# Patient Record
Sex: Male | Born: 1935 | Race: White | Hispanic: No | Marital: Married | Smoking: Former smoker
Health system: Southern US, Community
[De-identification: ages and names within clinical notes are randomized; demographics above are authoritative.]

## PROBLEM LIST (undated history)

## (undated) DIAGNOSIS — E039 Hypothyroidism, unspecified: Secondary | ICD-10-CM

## (undated) DIAGNOSIS — E782 Mixed hyperlipidemia: Secondary | ICD-10-CM

## (undated) DIAGNOSIS — F0390 Unspecified dementia without behavioral disturbance: Secondary | ICD-10-CM

## (undated) DIAGNOSIS — I219 Acute myocardial infarction, unspecified: Secondary | ICD-10-CM

## (undated) DIAGNOSIS — I2510 Atherosclerotic heart disease of native coronary artery without angina pectoris: Secondary | ICD-10-CM

## (undated) DIAGNOSIS — I10 Essential (primary) hypertension: Secondary | ICD-10-CM

## (undated) HISTORY — DX: Atherosclerotic heart disease of native coronary artery without angina pectoris: I25.10

## (undated) HISTORY — DX: Essential (primary) hypertension: I10

## (undated) HISTORY — DX: Mixed hyperlipidemia: E78.2

## (undated) HISTORY — DX: Unspecified dementia, unspecified severity, without behavioral disturbance, psychotic disturbance, mood disturbance, and anxiety: F03.90

## (undated) HISTORY — PX: CHOLECYSTECTOMY: SHX55

## (undated) HISTORY — PX: TONSILLECTOMY: SUR1361

## (undated) HISTORY — PX: CORONARY ANGIOPLASTY: SHX604

## (undated) HISTORY — DX: Acute myocardial infarction, unspecified: I21.9

---

## 2003-11-17 ENCOUNTER — Inpatient Hospital Stay (HOSPITAL_COMMUNITY): Admission: EM | Admit: 2003-11-17 | Discharge: 2003-11-20 | Payer: Self-pay | Admitting: Cardiology

## 2003-11-18 ENCOUNTER — Encounter: Payer: Self-pay | Admitting: Cardiovascular Disease

## 2003-12-23 ENCOUNTER — Ambulatory Visit: Payer: Self-pay | Admitting: Cardiology

## 2005-02-01 ENCOUNTER — Ambulatory Visit: Payer: Self-pay | Admitting: Cardiology

## 2006-02-28 ENCOUNTER — Encounter: Payer: Self-pay | Admitting: Cardiology

## 2006-03-04 ENCOUNTER — Ambulatory Visit: Payer: Self-pay | Admitting: Cardiology

## 2006-03-06 ENCOUNTER — Encounter: Payer: Self-pay | Admitting: Cardiology

## 2007-03-19 ENCOUNTER — Ambulatory Visit: Payer: Self-pay | Admitting: Cardiology

## 2007-03-19 ENCOUNTER — Encounter: Payer: Self-pay | Admitting: Cardiology

## 2007-04-01 ENCOUNTER — Ambulatory Visit: Payer: Self-pay | Admitting: Cardiology

## 2008-04-19 ENCOUNTER — Encounter: Payer: Self-pay | Admitting: Cardiology

## 2008-05-30 ENCOUNTER — Encounter: Payer: Self-pay | Admitting: Physician Assistant

## 2008-07-28 ENCOUNTER — Ambulatory Visit: Payer: Self-pay | Admitting: Cardiology

## 2008-10-11 DIAGNOSIS — I10 Essential (primary) hypertension: Secondary | ICD-10-CM | POA: Insufficient documentation

## 2008-10-11 DIAGNOSIS — I2510 Atherosclerotic heart disease of native coronary artery without angina pectoris: Secondary | ICD-10-CM

## 2008-10-11 DIAGNOSIS — E782 Mixed hyperlipidemia: Secondary | ICD-10-CM | POA: Insufficient documentation

## 2009-04-20 ENCOUNTER — Encounter: Payer: Self-pay | Admitting: Cardiology

## 2009-08-11 ENCOUNTER — Ambulatory Visit: Payer: Self-pay | Admitting: Cardiology

## 2009-08-11 DIAGNOSIS — I6529 Occlusion and stenosis of unspecified carotid artery: Secondary | ICD-10-CM | POA: Insufficient documentation

## 2010-02-20 NOTE — Assessment & Plan Note (Signed)
Summary: 1 YR FU PER JULY REMINDER-SRS      Allergies Added: NKDA  Visit Type:  Follow-up Primary Provider:  Dr. Kirstie Peri   History of Present Illness: 75 year old male presents for followup. He was last seen in July 2010. Since his last visit he reports no significant angina or unusual breathlessness. He continues to exercise 3 days a week, also playing golf regularly.  Labs from March revealed BUN 16, creatinine 1.3, AST 32, ALT 27, cholesterol 154, HDL 38, LDL 89, triglycerides 135, sodium 140, potassium 4.5, TSH 0.53, hemoglobin 14.4. We reviewed his lipids. He has tolerated simvastatin long-term.  No claudication symptoms. No speech problems or memory deficits. No focal motor weakness.  I reviewed his prior ischemic testing from 2009. Last carotid duplex was in 2008. We discussed repeat screening testing, however he prefers to hold off until his next visit.  Preventive Screening-Counseling & Management  Alcohol-Tobacco     Smoking Status: quit     Year Quit: 1989  Current Medications (verified): 1)  Atenolol 50 Mg Tabs (Atenolol) .... Take One Tablet By Mouth Daily 2)  Aspir-Low 81 Mg Tbec (Aspirin) .... Take 1 Tablet By Mouth Once A Day 3)  Synthroid 125 Mcg Tabs (Levothyroxine Sodium) .... Take 1 Tablet By Mouth Once A Day 4)  Simvastatin 40 Mg Tabs (Simvastatin) .... Take 1 Tablet By Mouth Once A Day 5)  Ramipril 10 Mg Caps (Ramipril) .... Take 1 Tablet By Mouth Once A Day 6)  Nitrostat 0.4 Mg Subl (Nitroglycerin) .... Use As Directed For Chest Pain Up To 3 Doses Every 5 Minutes.  Allergies (verified): No Known Drug Allergies  Comments:  Nurse/Medical Assistant: The patient's medication list and allergies were reviewed with the patient and were updated in the Medication and Allergy Lists.  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[employer]) Married  Tobacco Use - Former Alcohol Use - yes  Past Medical  History: CAD - DES LAD and DES x 3 RCA 2005, LVEF 60% Hyperlipidemia Hypertension Myocardial Infarction - 1989 (streptokinase) and 2005 Carotid artery disease - nonobstructive 2008  Past Surgical History: Unremarkable  Family History: Family History of Coronary Artery Disease  Social History: Retired Film/video editor Microbiologist) Married  Tobacco Use - Former Alcohol Use - yes  Review of Systems  The patient denies anorexia, fever, chest pain, syncope, dyspnea on exertion, peripheral edema, headaches, abdominal pain, melena, hematochezia, and severe indigestion/heartburn.         Otherwise reviewed and negative except as outlined.  Vital Signs:  Patient profile:   75 year old male Height:      71 inches Weight:      229 pounds BMI:     32.05 Pulse rate:   54 / minute BP sitting:   131 / 76  (left arm) Cuff size:   large  Vitals Entered By: Carlye Grippe (August 11, 2009 10:41 AM)  Nutrition Counseling: Patient's BMI is greater than 25 and therefore counseled on weight management options.  Physical Exam  Additional Exam:  Overweight male in no acute distress. HEENT: Conjunctiva and lids normal, oropharynx clear. Neck: Supple, no elevated JVP or bruits. No thyromegaly. Lungs: Clear to auscultation, nonlabored. Cardiac: Regular rate and rhythm, no S3 or pericardial rub. Abdomen: Soft, nontender, bowel sounds present. Skin: Warm and dry. Extremities: No pitting edema, distal pulses 2+. Musculoskeletal: No deformities. Neuropsychiatric: Alert and oriented x3, affect appropriate.   Stress Echocardiogram  Procedure date:  04/01/2007  Findings:      Exercise echocardiogram:  Achieved stage 4, 12 METS, 1 mm ST segment depression in the inferolateral leads, no chest pain. No inducible wall motion abnomalities to suggest ischemia by echocardiographic imaging, normal LVEF.  Echocardiogram  Procedure date:  04/01/2007  Findings:      LVEF 55-60%, no regional  wall motion abnormalities. Mild aortic stenosis.  EKG  Procedure date:  08/11/2009  Findings:      Sinus rhythm at 55 beats per minute with prolonged PR interval, evidence of previous inferior infarct.  Impression & Recommendations:  Problem # 1:  CAD, NATIVE VESSEL (ICD-414.01)  Symptomatically stable on medical therapy. No active ischemia by followup exercise echocardiogram in March 2009. We will likely proceed with followup screening ischemic evaluation around time of his next visit. No medication changes made today. Continue annual followup, sooner if needed.  His updated medication list for this problem includes:    Atenolol 50 Mg Tabs (Atenolol) .Marland Kitchen... Take one tablet by mouth daily    Aspir-low 81 Mg Tbec (Aspirin) .Marland Kitchen... Take 1 tablet by mouth once a day    Ramipril 10 Mg Caps (Ramipril) .Marland Kitchen... Take 1 tablet by mouth once a day    Nitrostat 0.4 Mg Subl (Nitroglycerin) ..... Use as directed for chest pain up to 3 doses every 5 minutes.  Orders: EKG w/ Interpretation (93000)  Problem # 2:  HYPERTENSION, UNSPECIFIED (ICD-401.9)  Blood pressure reasonably well controlled today.  His updated medication list for this problem includes:    Atenolol 50 Mg Tabs (Atenolol) .Marland Kitchen... Take one tablet by mouth daily    Aspir-low 81 Mg Tbec (Aspirin) .Marland Kitchen... Take 1 tablet by mouth once a day    Ramipril 10 Mg Caps (Ramipril) .Marland Kitchen... Take 1 tablet by mouth once a day  Problem # 3:  HYPERLIPIDEMIA-MIXED (ICD-272.4)  LDL 89 at most recent check, normal liver function tests. Tolerating simvastatin.  His updated medication list for this problem includes:    Simvastatin 40 Mg Tabs (Simvastatin) .Marland Kitchen... Take 1 tablet by mouth once a day  Problem # 4:  CAROTID ARTERY DISEASE (ICD-433.10)  Nonobstructive based on prior assessment. We discussed followup carotid duplex, and he prefers deferring until around time of his next visit. Continue aspirin.  His updated medication list for this problem  includes:    Aspir-low 81 Mg Tbec (Aspirin) .Marland Kitchen... Take 1 tablet by mouth once a day  Patient Instructions: 1)  Your physician wants you to follow-up in: 1 year. You will receive a reminder letter in the mail one-two months in advance. If you don't receive a letter, please call our office to schedule the follow-up appointment. 2)  Your physician recommends that you continue on your current medications as directed. Please refer to the Current Medication list given to you today. Prescriptions: NITROSTAT 0.4 MG SUBL (NITROGLYCERIN) use as directed for chest pain up to 3 doses every 5 minutes.  #25 x 3   Entered by:   Cyril Loosen, RN, BSN   Authorized by:   Loreli Slot, MD, [facility]   Signed by:   Cyril Loosen, RN, BSN on 08/11/2009   Method used:   Electronically to        [facility]* (retail)       [address]       [place], [place]  [address]       Ph: [phone]       Fax:  [phone]   RxID:   [id number]

## 2010-06-05 NOTE — Assessment & Plan Note (Signed)
[facility]                          [facility] OFFICE NOTE   Cole Mayer, Cole Mayer                      MRN:          [id number]  DATE:03/19/2007                            DOB:          Aug 19, 1935    CARDIOLOGIST:  Dr. Simona Huh.   PRIMARY CARE PHYSICIAN:  Dr. Kirstie Peri.   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  He presents to the office today for  follow-up.  He has a history of coronary artery disease and is status  post acute inferior ST elevation myocardial infarction in October 2005.  He was treated with emergent TAXUS drug-eluting stent placement to the  RCA x3 and TAXUS stent to the proximal LAD.  He did have residual  noncritical circumflex disease and ejection fraction has been preserved  in the past.   In the office, the patient is doing well.  He exercises on a daily basis  and also plays golf.  He denies any exertional chest heaviness or  tightness.  Denies any orthopnea, PND, pedal edema.  Denies any  palpitations, syncope or near-syncope.   CURRENT MEDICATIONS:  Synthroid 125 mcg daily, aspirin 325 mg daily,  niacin 750 mg daily, Altace 2.5 mg daily, atenolol 50 mg daily,  simvastatin 40 mg q.h.s.  Nitroglycerin p.r.n. chest pain.   ALLERGIES:  No known drug allergies.   PHYSICAL EXAM:  He is well-nourished, well-developed male in no acute  distress.  Blood pressure 140/76, pulse 60, weight 237.4 pounds.  HEENT:  normal.  NECK:  Without JVD.  CARDIAC:  S1, S2.  Regular rate and rhythm.  There is a 1-2/6 systolic  ejection murmur heard best along the left sternal border.  LUNGS:  Clear to auscultation bilaterally without wheezing, rhonchi or  rales.  ABDOMEN:  Soft, nontender, normoactive bowel sounds, no organomegaly.  EXTREMITIES:  Without edema.  NEUROLOGIC:  He is alert and  oriented x3.  Cranial nerves 2-12 are  intact.  VASCULAR:  Bilateral carotid artery bruits.   ELECTROCARDIOGRAM:  Reveals sinus rhythm with heart rate of 62, normal  axis, nonspecific ST-T wave changes.   IMPRESSION:  1. Systolic murmur.  2. Coronary artery disease.      a.     Status post acute inferior STEMI in October 2005 treated       with TAXUS drug-eluting stent to the RCA x3 and Taxus drug-eluting       stent to LAD.      b.     Residual CAD:  50% ostial circumflex at the time of       catheterization October 2005.      c.     History of remote myocardial infarction treated with       streptokinase in the past.  3. Good LV function.  4. Cerbrovascular disease.      a.     Carotid Dopplers February 2008 revealing less than 50%       bilateral ICA stenosis.  5. Dyslipidemia, followed by Dr. Sherryll Burger.  6. Hypertension.   DISCUSSION:  Cole Mayer returns office day for annual follow-up.  Overall he is doing well.  Denies any symptoms consistent with angina.  He does have a systolic murmur on exam.  He is greater than 3 years out  from his MI and PCI.   PLAN:  1. 2-D echocardiogram to further assess his systolic murmur.  2. Proceed with stress echocardiogram to assess stent patency status      post myocardial infarction and PCI.  3. His blood pressure is borderline elevated today.  However, the      patient tells me he keeps a close eye on this and his numbers are      consistently less than 140/90 at home.  4. Continue follow-up with Dr. Sherryll Burger for his hyperlipidemia.  His goal      LDL is less than or equal to 70.  5. He will follow up with Dr. Simona Huh in one year's time or      sooner p.r.n.      Tereso Newcomer, PA-C  Electronically Signed      Learta Codding, MD,FACC  Electronically Signed   SW/MedQ  DD: 03/19/2007  DT: 03/20/2007  Job #: [id number]   cc:   Kirstie Peri, MD

## 2010-06-05 NOTE — Assessment & Plan Note (Signed)
[facility]                          [facility]ICE NOTE   Cole Mayer, Cole Mayer                      MRN:          [id number]  DATE:07/28/2008                            DOB:          May 06, 1935    PRIMARY CARE PHYSICIAN:  Kirstie Peri, MD   REASON FOR VISIT:  Routine followup.   HISTORY OF PRESENT ILLNESS:  Cole Mayer was in the office back in  February 2009 for a regular visit.  He continues to do very well without  any active anginal symptoms.  He exercises most mornings at one of the  local fitness clubs using the treadmill and also weights without any  specific symptoms.  He also plays golf 4 days a week.  His  electrocardiogram shows sinus rhythm with a single premature ventricular  complex and small inferolateral Q-waves which are old.  He underwent  ischemic evaluation after the last visit in clinic and had a normal  exercise echocardiogram without active ischemic changes by  echocardiographic findings in the setting of an abnormal  electrocardiographic response and a maximum workload of 12 minutes.  No  chest pain was reported at that time.  He also had a surface  echocardiogram with findings of mild aortic stenosis and a left  ventricular ejection fraction of 55-60%.  He reports tolerating his  medicines fairly well.  He had labs done per Dr. Sherryll Burger back in March  showing a BUN and creatinine of 80 and 1.3 respectively.  Total  cholesterol 146, triglycerides 129, HDL 40, LDL 80.  SGOT 29, SGPT 28,  hemoglobin 14.8.   ALLERGIES:  No known drug allergies.   PRESENT MEDICATIONS:  1. Enteric-coated aspirin 81 mg p.o. daily.  2. Synthroid 125 mcg p.o. daily.  3. Atenolol 50 mg p.o. daily.  4. Simvastatin 40 mg p.o. daily.  5. Ramipril 10 mg p.o. daily.  6. Sublingual nitroglycerin 4 mg p.r.n.   REVIEW OF SYSTEMS:  Outlined above.  He is not having any claudication  symptoms.  He has had no palpitations or syncope.  He reports  stable  appetite and an intentional weight loss.  Otherwise, systems reviewed  and negative.   PHYSICAL EXAMINATION:  VITAL SIGNS:  Blood pressure 130/81, heart rate  is 61, weight is 222 pounds which is down from 237 pounds at his last  visit.  GENERAL:  He is comfortable and in no acute distress.  HEENT:  Conjunctiva is normal.  Oropharynx clear.  NECK:  Supple.  No elevated jugular venous pressure.  Faint right  carotid bruit.  No thyromegaly.  LUNGS:  Clear without labored breathing at rest.  CARDIAC:  Regular rate and rhythm.  Soft basal systolic murmur with  preserved second heart sounds.  No S3 gallop or pericardial rub.  ABDOMEN:  Soft, nontender.  No bruits.  No hepatomegaly.  EXTREMITIES:  Exhibit no frank pitting edema.  Distal pulses are 2+.  SKIN:  Warm and dry.  MUSCULOSKELETAL:  No kyphosis is noted.  NEUROPSYCHIATRIC:  The patient is alert and oriented x3.  Affect is  appropriate.   Review of  previous carotid Doppler examination from March 2009 revealed  less than 50% stenosis in the internal carotid arteries.   IMPRESSION AND RECOMMENDATIONS:  1. Cardiovascular disease, status post previous inferior wall      myocardial infarction in October 2005 treated with drug-eluting      stents in the right coronary artery as well as ultimately a drug-      eluting stent in the left anterior descending.  He has residual 50%      circumflex stenosis that has been managed medically and had an      overall reassuring exercise echocardiogram done last year.  He is      not reporting any new symptoms and has been exercising regularly      with an intentional weight loss and diet.  His medications are      stable, and I will not make any specific changes today.  We talked      about warning signs and will otherwise plan an annual followup.  No      further ischemic testing is needed at this time.  2. Hyperlipidemia, fairly well-controlled on statin therapy.  Recent      LDL was  80 with normal liver function tests on simvastatin.  3. Nonobstructive carotid artery disease, asymptomatic.  4. Hypertension, reasonably well-controlled.     Jonelle Sidle, MD  Electronically Signed    SGM/MedQ  DD: 07/28/2008  DT: 07/29/2008  Job #: [id number]   cc:   Kirstie Peri, MD

## 2010-06-08 NOTE — Assessment & Plan Note (Signed)
[facility]                          [facility]ICE NOTE   Cole Mayer, Cole Mayer                        MRN:          [id number]  DATE:03/04/2006                            DOB:          10/22/1935    Primary cardiologist:  Dr. Simona Huh.   REASON FOR VISIT:  Scheduled annual followup.   Cole Mayer is a very pleasant 75 year old male with known coronary  artery disease, who now presents for annual followup.   Patient has been doing extremely well from a clinical standpoint, with  no interim development of symptoms suggestive of unstable angina  pectoris.  In fact, he exercises on a regular basis both at a workout  facility as well as playing golf at least 4 times a week.  Moreover, he  has also lost 30 pounds in this past year.   Quite recently, patient was briefly hospitalized here at [facility] with  flu-like symptoms, and was found to be positive for influenza type A.  While awaiting blood work, he experienced a syncopal episode which was  felt to be secondary to orthostatic hypotension.  He was treated with  fluids and taken off his Altace and Atenolol, pending this followup.  He  was just discharged 3 days ago, and has not had any recurrent symptoms.  His blood pressure, however, has drifted upward since he stopped both  these medications.   CURRENT MEDICATIONS:  1. Synthroid 0.125 daily.  2. Aspirin 81 daily.  3. Niacin 750 daily.  4. Simvastatin 40 daily.  5. Theraflu.   PHYSICAL EXAMINATION:  Blood pressure 170/101.  Pulse 82, regular.  Weight 229, up 0.6.  GENERAL:  A 75 year old male sitting upright, in no distress.  NECK:  Palpable carotid pulses with faint right carotid bruit; no bruit  on the left.  LUNGS:  Clear to auscultation in all fields.  HEART:  Regular rate and rhythm (S1 and S2).  Soft S4 with a soft grade  2/6 holosystolic murmur in the upper left sternal border.  ABDOMEN:  Soft, non-tender with intact bowel  sounds.  EXTREMITIES:  Palpable distal pulses without significant edema.  NEUROLOGIC:  No focal deficit.   IMPRESSION:  1. Severe 2-vessel coronary artery disease.      a.     Status post acute inferior myocardial infarction/emergent       overlapping TAXUS stenting of the right coronary artery (x3), and       TAXUS stenting of the proximal left anterior descending, October       2005.      b.     Residual non-critical CFX disease.      c.     Preserved left ventricular function.  2. Mild cerebral vascular disease.      a.     40%-50% left internal coronary artery; 0-39% right internal       coronary artery stenosis by carotid Doppler, January 2007.  3. Hyperlipidemia.      a.     Followed by Dr. Eliberto Ivory.  4. Hypertension.  5. Remote tobacco.   PLAN:  1. Resume previous  medication regimen, including Altace and atenolol      to begin later today.  Patient has been instructed to check his      blood pressure in the next 7 to 10 days, and to contact us with any      questions or concerns.  2. Schedule annual surveillance carotid Dopplers to exclude      significant progression.  3. Schedule return clinic followup with Dr. Simona Huh in 1 year.  4. Continue aggressive lipid management, per Dr. Eliberto Ivory, with LDL goal      of 70 or less.      Gene Serpe, PA-C  Electronically Signed      Learta Codding, MD,FACC  Electronically Signed   GS/MedQ  DD: 03/04/2006  DT: 12/04/2006  Job #: [id number]

## 2010-06-08 NOTE — Cardiovascular Report (Signed)
NAME:  ERVEN, RAMSON NO.:  [id number]   MEDICAL RECORD NO.:  [id number]          PATIENT TYPE:  INP   LOCATION:  2931                         FACILITY:  [facility]   PHYSICIAN:  Charlies Constable, M.D. [facility] DATE OF BIRTH:  1935-04-26   DATE OF PROCEDURE:  11/17/2003  DATE OF DISCHARGE:                              CARDIAC CATHETERIZATION   CLINICAL HISTORY:  Mr. Siems is 75 years old and has a remote history of  myocardial infarction treated with Streptokinase for which he did not  require subsequent angioplasty.  He developed the onset of chest pain today  and presented to [facility] where his EKG showed 1 mm ST elevation  in the inferior leads.  He was given aspirin, Lovenox, and Integrillin and  transferred to [place] for intervention.   PROCEDURE:  The procedure was performed by the right femoral artery using  arterial sheath and 6 French coronary catheters.  Omnipaque contrast was  used.  After completion of the diagnostic study, we proceeded with  intervention of the right coronary artery and possibly the left anterior  descending  artery.  The patient had been given Lovenox within three hours,  so we did not give any additional anticoagulation.  We did continue the  Integrillin and we gave 600 mg of Plavix.  We used a JR4 6 [place] guiding  catheter with side holes and a Luge wire.  We crossed the lesions in the  proximal, mid, and distal right coronary artery with the wire without  difficulty.  We first went in with the diver catheter since there was a  thrombus in the proxima portion of the right coronary artery.  We performed  several runs with aspiration and obtained a small amount of thrombus with  some resolution of the visible thrombus.  We then went in with a 2.5 by 20  mm Maverick and performed multiple inflations from the proximal to the  distal vessel.  We then deployed a 3 by 32 mm Taxus stent beginning in the  distal vessel and we deployed this with  one inflation of 15 atmospheres for  30 seconds.  We then deployed a second 3 by 30 mm Taxus stent overlapping  the first stent and deployed this with one inflation of 15 atmospheres for  30 seconds.  We then deployed a 3.5 by 12 mm Taxus overlapping the second  stent in the proximal right coronary artery and deployed this with one  inflation of 15 atmospheres for 30 seconds.  We then post dilated all stents  with a 3.5 by 20 mm Quantum Maverick performing multiple inflations up to 16  atmospheres avoiding the distal and the very proximal edges.  Before we did  the post dilatation, there appeared to be a small filling defect in the mid  stent.  For this reason, we decided to give an additional Lovenox and we  gave 0.3 mg per kg IV.  This defect disappeared following the post  dilations.   We next approached the left anterior descending  artery.  We used a Q4 6  [place] guiding catheter with side  holes and a new Luge wire.  We crossed the  lesion with the wire without difficulty.  We predilated with a 2.5 by 20 mm  Maverick performing one inflation up to 10 atmospheres for 30 seconds.  We  then employed a 3 by 20 mm Taxus stent following this with one inflation of  14 atmospheres for 30 seconds.  We post dilated the mid and distal portion  of the stent with a 3.25 by 50 mm Quantum Maverick performing two inflations  up to 16 atmospheres for 30 seconds.  We post dilated the proximal portion  of the stent with a 3.75 by 8 mm Quantum Maverick performing one inflation  up to 16 atmospheres for 30 seconds.  Final diagnostic studies were then  performed through the guiding catheter.  The patient tolerated the procedure  well and left the laboratory in satisfactory condition.   RESULTS:  The aortic pressure was 138/90 with a mean of 113.   Left main coronary artery is free of significant disease.   Left anterior descending artery gave rise to a diagonal branch and a septal  perforator and a  second diagonal branch.  There was a 95% segmental stenosis  after the diagonal branch and septal perforator.   The circumflex artery gives rise to a ramus branch and an AV branch.  There  was 50% narrowing in the ostium of the AV branch.   The right coronary artery is a moderately large vessel that gave rise to a  right ventricular branch, posterior descending branch, and a posterolateral  branch.  There was 99% stenosis in the proximal vessel with a filling defect  in the proximal vessel distal to the stenosis.  There were 70% and 80%  stenoses in the mid vessel and 90% stenosis in the distal vessel.  TIMI flow  was initially TIMI-II.   The left ventriculogram performed in the RAO projection showed hypokinesis  of the inferobasal segment.  The rest of the wall motion was good and the  estimated ejection fraction was 60%.   Following placement of three tandem overlapping stents in the proximal, mid,  and distal right coronary artery, the stenoses improved from 99%, 80%, and  90%, to less than 10% and the flow improved from TIMI II to TIMI III flow.   Following stenting of the lesion in the proximal LAD, the stenosis improved  from 95% to 0%.   The patient had the onset of chest pain at 0700 and arrived at [facility] at 0800.  He arrived at [facility] at 1141 and the first  balloon inflation was at 1243.  This gave a reperfusion time of 5 hours 43  minutes and a time from [facility] of 4 hours and 43 minutes.   CONCLUSION:  1.  Acute diaphragmatic wall infarction with 99% proximal, 70% and 80% mid,      and 90% distal stenosis in the right coronary artery with TIMI-II flow,      50% narrowing in the ostium of the circumflex artery, and 95% stenosis      in the proximal left anterior descending artery and inferobasal wall      hypokinesis with an estimated ejection fraction of 60%. 2.  Successful placement of three tandem overlapping stents in the  proximal,      mid, and distal right coronary artery using Taxus drug eluding stents      with improvement of narrowing from 99%, 80%, and 90%, to less  than 10%      improving the flow from TIMI-II to TIMI-III flow.  3.  Successful stenting of the lesion in the proximal LAD with a Taxus drug      eluting stent with improvement of narrowing from 95% to 0%.   DISPOSITION:  The patient is admitted for further observation.  The  infarction is small so he may be able to go home on day two.  Long term  Plavix therapy will be extremely important.      BB/MEDQ  D:  11/17/2003  T:  11/17/2003  Job:  [id number]   cc:   Winona Legato, M.D.   Jonelle Sidle, M.D. [facility]   Willa Rough, M.D.

## 2010-06-08 NOTE — Discharge Summary (Signed)
NAME:  Cole Mayer, Cole Mayer NO.:  [id number]   MEDICAL RECORD NO.:  [id number]          PATIENT TYPE:  INP   LOCATION:  3733                         FACILITY:  [facility]   PHYSICIAN:  Charlies Constable, M.D. [facility] DATE OF BIRTH:  12/11/1935   DATE OF ADMISSION:  11/17/2003  DATE OF DISCHARGE:  11/20/2003                                 DISCHARGE SUMMARY   PROCEDURES:  1.  Cardiac catheterization.  2.  Coronary arteriogram.  3.  Left ventriculogram.  4.  PTCA and TAXUS stent x3 to the RCA.  5.  PTCA and stent to the LAD.   HOSPITAL COURSE:  Cole Mayer is a 75 year old male with a history of  coronary artery disease.  He had streptokinase at [facility] in 1989.  He was at  [place] on the day of admission and he had acute coronary syndrome  including diaphoresis and radiation of pain to his left arm.  He came to the  [facility] and had a recurrent episode while he was in the  emergency room after he had been given Lovenox.  This was associated with  bradycardia.  He had inferior ST elevations.  He was transferred urgently to  [facility] for further evaluation and catheterization.   The cardiac catheterization showed a 95% mid LAD that was treated with a  TAXUS stent reducing the stenosis to zero.  The RCA had multiple lesions  between 70 and 99%.  There was thrombus in the RCA as well.  There was TIMI  II flow.  These were treated with 3 TAXUS stents reducing the stenosis in  all of them to less than 10%.  There was TIMI III flow after the procedure.  His circumflex had a 50% proximal stenosis but no other significant disease.  He had inferior hypokinesis and an EF of 60%.   Cole Mayer CK-MB peaked at 1031/132.  The troponin-I peak was 24.  His  enzymes came down significantly after the procedure.  He remained pain-free.   A 2-D echocardiogram was performed.  His EF was not well visualized but  appeared in the normal range.  His heart valves and right  ventricle were  grossly normal.  There was no pericardial effusion.   Cole Mayer had been on Zocor 10 mg and niacin 750 mg prior to admission for  dyslipidemia.  A lipid profile was checked and showed a total cholesterol of  150, triglycerides 134, HDL 42, LDL 81.  His Zocor was increased to 20 mg a  day and the niacin continued at 750 mg.  He will need a follow up with the  profile in 12 weeks.  As part of his evaluation, a hemoglobin A1C was  checked for hyperglycemia.  The hemoglobin A1C was borderline at 6.1.  He is  to avoid processed sugars and follow closely with his primary care  physician.   Cole Mayer was seen by cardiac rehab.  He was ambulating without chest pain  or shortness of breath.  He was educated on MI restrictions, PTCA and stent  restrictions, cardiac risk factors, diet  and exercise guidelines.  He is  referred to outpatient rehab.   By November 20, 2003, Cole Mayer was ambulating without chest pain or  shortness of breath.  His vital signs were stable and he was considered  stable for discharge with outpatient follow up arranged.   DISCHARGE DIAGNOSES:  1.  Acute inferior myocardial infarction.  2.  Hypertension.  3.  Hyperlipidemia.  4.  Hypothyroidism (TSH normal this admission).  5.  Status post epididymitis, cholecystectomy, scar repair, tonsillectomy.   DISCHARGE INSTRUCTIONS:  1.  His activity level is to include no driving for 5 days and no strenuous      activity until cleared by M.D.  2.  He is to stick to a diet that is low in fat and processed sugars.  3.  He is to call the office for problems with the cath site.  4.  He is to follow up with Dr. Raul Del as needed.  5.  He has an appointment with Dr. Diona Browner on November 16th at 12:30 in the      [place] office.   DISCHARGE MEDICATIONS:  1.  Coated aspirin 325 mg daily.  2.  Plavix 75 mg daily.  3.  Zocor 20 mg daily.  4.  Niacin 750 mg daily.  5.  Nitroglycerin 0.4 mg p.r.n.  6.  Synthroid 125  mcg daily.  7.  Altace 2.5 mg daily.  8.  Tenormin 50 mg daily.       RB/MEDQ  D:  11/20/2003  T:  11/20/2003  Job:  [id number]   cc:   [facility]  [place], [address]

## 2010-06-22 ENCOUNTER — Ambulatory Visit (INDEPENDENT_AMBULATORY_CARE_PROVIDER_SITE_OTHER): Payer: Medicare Other | Admitting: Urology

## 2010-06-22 DIAGNOSIS — N509 Disorder of male genital organs, unspecified: Secondary | ICD-10-CM

## 2010-07-26 ENCOUNTER — Encounter: Payer: Self-pay | Admitting: Cardiology

## 2011-01-21 ENCOUNTER — Encounter: Payer: Self-pay | Admitting: *Deleted

## 2011-02-01 ENCOUNTER — Ambulatory Visit (INDEPENDENT_AMBULATORY_CARE_PROVIDER_SITE_OTHER): Payer: Medicare Other | Admitting: Cardiology

## 2011-02-01 ENCOUNTER — Encounter: Payer: Self-pay | Admitting: Cardiology

## 2011-02-01 DIAGNOSIS — E782 Mixed hyperlipidemia: Secondary | ICD-10-CM

## 2011-02-01 DIAGNOSIS — I2510 Atherosclerotic heart disease of native coronary artery without angina pectoris: Secondary | ICD-10-CM

## 2011-02-01 DIAGNOSIS — I10 Essential (primary) hypertension: Secondary | ICD-10-CM

## 2011-02-01 DIAGNOSIS — I6529 Occlusion and stenosis of unspecified carotid artery: Secondary | ICD-10-CM

## 2011-02-01 MED ORDER — NITROGLYCERIN 0.4 MG SL SUBL: 0.4000 mg | SUBLINGUAL_TABLET | SUBLINGUAL | Status: DC | PRN

## 2011-02-01 NOTE — Assessment & Plan Note (Signed)
No changes to present antihypertensive regimen. Continue exercise and diet.

## 2011-02-01 NOTE — Patient Instructions (Signed)
Your physician you to follow up in 1 year. You will receive a reminder letter in the mail one-two months in advance. If you don't receive a letter, please call our office to schedule the follow-up appointment. Your physician recommends that you continue on your current medications as directed. Please refer to the Current Medication list given to you today. 

## 2011-02-01 NOTE — Assessment & Plan Note (Signed)
I recommended that he followup with a carotid Doppler at his physical.

## 2011-02-01 NOTE — Assessment & Plan Note (Signed)
Should have followup fasting lipid profile and liver function tests with his physical per Dr. Sherryll Burger. LDL should be under 100. He continues on statin therapy.

## 2011-02-01 NOTE — Assessment & Plan Note (Signed)
Symptomatically stable on medical therapy. ECG reviewed. He continues to exercise regularly without major limitation. I have discussed follow up ischemic testing with him over time, however he prefers a conservative approach with observation. Plan to continue annual followup, although I can certainly see him back sooner if symptoms intervene. I did recommend that he followup in the interim with Dr. Sherryll Burger. Prescription given for nitroglycerin. Warning signs and symptoms discussed.

## 2011-02-01 NOTE — Progress Notes (Signed)
   Clinical Summary [name] is a 76 y.o.male presenting for followup. He was seen in July 2011. He states that he is doing quite well, no angina or unusual shortness of breath. No nitroglycerin requirement. He continues to exercise regularly both aerobic and weights, also plays golf weekly.  I reviewed his prior ischemic testing from 2009. Last carotid duplex was in 2008. We have discussed followup testing over time, however he continues to prefer a conservative approach with observation, not entirely unreasonable in light of his asymptomatic status. ECG is stable.  He states that he is due for a full physical with Dr. Sherryll Mayer in April at which time lab work will be obtained. I discussed blood pressure and lipid goals with him. I also recommended that he get a followup carotid Doppler at that time.   No Known Allergies  Current Outpatient Prescriptions  Medication Sig Dispense Refill  . aspirin 81 MG EC tablet Take 81 mg by mouth daily.        Marland Kitchen atenolol (TENORMIN) 50 MG tablet Take 50 mg by mouth daily.        Marland Kitchen levothyroxine (SYNTHROID, LEVOTHROID) 125 MCG tablet Take 125 mcg by mouth daily.        . nitroGLYCERIN (NITROSTAT) 0.4 MG SL tablet Place 0.4 mg under the tongue every 5 (five) minutes as needed. Up to 3 doses.       . ramipril (ALTACE) 10 MG capsule Take 10 mg by mouth daily.        . simvastatin (ZOCOR) 40 MG tablet Take 40 mg by mouth daily.          Past Medical History  Diagnosis Date  . Mixed hyperlipidemia   . Essential hypertension, benign   . Myocardial infarction     1989 (Streptokinase) and 2005  . Coronary atherosclerosis of native coronary artery     DES LAD and DES x3 RCA 2005, LVEF 60%     Social History Cole Mayer reports that he quit smoking about 23 years ago. His smoking use included Cigarettes. He has a 24 pack-year smoking history. He has quit using smokeless tobacco. His smokeless tobacco use included Chew. [name] reports that he drinks  alcohol.  Review of Systems No palpitations, orthopnea, PND. No claudication. No significant lower extremity edema. Stable appetite. Gained a few pounds over the holidays. Otherwise negative.  Physical Examination Filed Vitals:   02/01/11 0817  BP: 137/83  Pulse: 60   Overweight male in no acute distress.  HEENT: Conjunctiva and lids normal, oropharynx clear.  Neck: Supple, no elevated JVP or bruits. No thyromegaly.  Lungs: Clear to auscultation, nonlabored.  Cardiac: Regular rate and rhythm, no S3 or pericardial rub.  Abdomen: Soft, nontender, bowel sounds present.  Skin: Warm and dry.  Extremities: No pitting edema, distal pulses 2+.  Musculoskeletal: No deformities.  Neuropsychiatric: Alert and oriented x3, affect appropriate.    ECG Reviewed in EMR.  Problem List and Plan

## 2012-02-20 ENCOUNTER — Ambulatory Visit (INDEPENDENT_AMBULATORY_CARE_PROVIDER_SITE_OTHER): Payer: Medicare Other | Admitting: Cardiology

## 2012-02-20 ENCOUNTER — Encounter: Payer: Self-pay | Admitting: Cardiology

## 2012-02-20 VITALS — BP 119/71 | HR 58 | Ht 71.0 in | Wt 217.0 lb

## 2012-02-20 DIAGNOSIS — E785 Hyperlipidemia, unspecified: Secondary | ICD-10-CM

## 2012-02-20 DIAGNOSIS — I2510 Atherosclerotic heart disease of native coronary artery without angina pectoris: Secondary | ICD-10-CM

## 2012-02-20 DIAGNOSIS — I10 Essential (primary) hypertension: Secondary | ICD-10-CM

## 2012-02-20 NOTE — Patient Instructions (Addendum)

## 2012-02-20 NOTE — Assessment & Plan Note (Signed)
Continue statin therapy. Followup lab work with Dr. Sherryll Burger. Would aim for LDL around 70 if possible.

## 2012-02-20 NOTE — Assessment & Plan Note (Signed)
Symptomatically stable on medical therapy. ECG reviewed. Continue regular exercise plan. He voiced comfort with continued observation for now. I encouraged him to let me know if he notices any significant functional changes or new symptomatology that may warrant further cardiac evaluation. Otherwise continue annual followup.

## 2012-02-20 NOTE — Assessment & Plan Note (Signed)
Blood pressure very well controlled today. 

## 2012-02-20 NOTE — Progress Notes (Signed)
   Clinical Summary Cole Mayer is a 77 y.o.male presenting for followup. He was seen in January 2013. He states that he has been doing very well, still exercises at the gym regularly and plays golf 4 days a week. He describes no change in his functional capacity, no exertional chest pain or unusual breathlessness.  ECG today shows sinus bradycardia, no significant ST segment changes.  He reports compliance with his medications, no nitroglycerin use. To have his full physical with Dr. Sherryll Burger in April and go over lab work at that time.  We discussed option of observation versus a followup stress test given the length of time since his previous intervention. He voiced comfort with observation for now.   No Known Allergies  Current Outpatient Prescriptions  Medication Sig Dispense Refill  . aspirin 81 MG EC tablet Take 81 mg by mouth daily.        Marland Kitchen atenolol (TENORMIN) 50 MG tablet Take 50 mg by mouth daily.        Marland Kitchen levothyroxine (SYNTHROID, LEVOTHROID) 125 MCG tablet Take 125 mcg by mouth daily.        . nitroGLYCERIN (NITROSTAT) 0.4 MG SL tablet Place 1 tablet (0.4 mg total) under the tongue every 5 (five) minutes as needed. Up to 3 doses.  25 tablet  3  . ramipril (ALTACE) 10 MG capsule Take 10 mg by mouth daily.        . simvastatin (ZOCOR) 40 MG tablet Take 40 mg by mouth daily.          Past Medical History  Diagnosis Date  . Mixed hyperlipidemia   . Essential hypertension, benign   . Myocardial infarction     1989 (Streptokinase) and 2005  . Coronary atherosclerosis of native coronary artery     DES LAD and DES x3 RCA 2005, LVEF 60%     Social History Cole Mayer reports that he quit smoking about 24 years ago. His smoking use included Cigarettes. He has a 24 pack-year smoking history. He has quit using smokeless tobacco. His smokeless tobacco use included Chew. Cole Mayer reports that he drinks alcohol.  Review of Systems No palpitations, no bleeding problems. Stable appetite.  Sleeping well. No claudication. Otherwise negative.  Physical Examination Filed Vitals:   02/20/12 1247  BP: 119/71  Pulse: 58   Filed Weights   02/20/12 1247  Weight: 217 lb (98.431 kg)    Overweight male in no acute distress.  HEENT: Conjunctiva and lids normal, oropharynx clear.  Neck: Supple, no elevated JVP or bruits. No thyromegaly.  Lungs: Clear to auscultation, nonlabored.  Cardiac: Regular rate and rhythm, no S3 or pericardial rub.  Abdomen: Soft, nontender, bowel sounds present.  Skin: Warm and dry.  Extremities: No pitting edema, distal pulses 2+.  Musculoskeletal: No deformities.  Neuropsychiatric: Alert and oriented x3, affect appropriate.    Problem List and Plan   CAD, NATIVE VESSEL Symptomatically stable on medical therapy. ECG reviewed. Continue regular exercise plan. He voiced comfort with continued observation for now. I encouraged him to let me know if he notices any significant functional changes or new symptomatology that may warrant further cardiac evaluation. Otherwise continue annual followup.  Essential hypertension, benign Blood pressure very well controlled today.  HYPERLIPIDEMIA-MIXED Continue statin therapy. Followup lab work with Dr. Sherryll Burger. Would aim for LDL around 70 if possible.    Jonelle Sidle, M.D., F.A.C.C.

## 2012-03-17 ENCOUNTER — Encounter: Payer: Self-pay | Admitting: *Deleted

## 2012-03-17 ENCOUNTER — Other Ambulatory Visit: Payer: Self-pay | Admitting: *Deleted

## 2012-03-17 ENCOUNTER — Telehealth: Payer: Self-pay | Admitting: Cardiology

## 2012-03-17 DIAGNOSIS — I2510 Atherosclerotic heart disease of native coronary artery without angina pectoris: Secondary | ICD-10-CM

## 2012-03-17 NOTE — Telephone Encounter (Signed)
Spoke with patient and he said he wasn't having any problems but knew that it had been a while since he had a stress test and wanted to know if he should go ahead and have it done since it was discussed at last visit.

## 2012-03-17 NOTE — Telephone Encounter (Signed)
Left message for patient to call office.  

## 2012-03-17 NOTE — Telephone Encounter (Signed)
Please schedule an exercise Cardiolite. Hold atenolol the morning of the study.

## 2012-03-17 NOTE — Telephone Encounter (Signed)
Cole Mayer states that Dr. Diona Browner mentioned having a stress test on his last visit. Cole Mayer Is ready now to have the test.

## 2012-03-17 NOTE — Telephone Encounter (Signed)
Exercise cardiolite weight 217 Hold Atenolol the morning of the study.  Diagnosis: 414.01, 401.1   Tuesday, March 4th 2014 @ [facility]

## 2012-03-19 NOTE — Telephone Encounter (Signed)
Auth # [id number] exp 05/03/12

## 2012-03-24 DIAGNOSIS — I2510 Atherosclerotic heart disease of native coronary artery without angina pectoris: Secondary | ICD-10-CM

## 2012-03-31 ENCOUNTER — Telehealth: Payer: Self-pay | Admitting: *Deleted

## 2012-03-31 NOTE — Telephone Encounter (Signed)
Notes Recorded by Lesle Chris, LPN on 0/98/1191 at 11:39 AM Patient notified. ------  Notes Recorded by Lesle Chris, LPN on 4/78/2956 at 5:31 PM Left message to return call.

## 2012-03-31 NOTE — Telephone Encounter (Signed)
Message copied by Lesle Chris on Tue Mar 31, 2012 11:39 AM ------      Message from: MCDOWELL, Illene Bolus      Created: Wed Mar 25, 2012  8:28 AM       Reviewed. Study is overall low risk, consistent with history of CAD and possibility of scar within the inferolateral wall, however no large ischemic zones and overall normal LV function with EF 57%. If he continues to remain symptomatically stable, would recommend observation on medical therapy. ------

## 2012-04-02 ENCOUNTER — Telehealth: Payer: Self-pay | Admitting: Cardiology

## 2012-04-02 ENCOUNTER — Other Ambulatory Visit: Payer: Self-pay | Admitting: *Deleted

## 2012-04-02 MED ORDER — NITROGLYCERIN 0.4 MG SL SUBL: 0.4000 mg | SUBLINGUAL_TABLET | SUBLINGUAL | Status: DC | PRN

## 2012-04-02 NOTE — Telephone Encounter (Signed)
Discussed test results again, patient verbalized understanding.

## 2012-04-02 NOTE — Telephone Encounter (Signed)
Patient came to office and asked to speak with someone regarding stress test results.  Patient not clear on information.

## 2013-02-16 ENCOUNTER — Encounter: Payer: Self-pay | Admitting: Cardiology

## 2013-02-16 ENCOUNTER — Ambulatory Visit (INDEPENDENT_AMBULATORY_CARE_PROVIDER_SITE_OTHER): Payer: Medicare (Managed Care) | Admitting: Cardiology

## 2013-02-16 VITALS — BP 136/75 | HR 52 | Ht 71.0 in | Wt 224.0 lb

## 2013-02-16 DIAGNOSIS — I6529 Occlusion and stenosis of unspecified carotid artery: Secondary | ICD-10-CM

## 2013-02-16 DIAGNOSIS — I10 Essential (primary) hypertension: Secondary | ICD-10-CM

## 2013-02-16 DIAGNOSIS — E785 Hyperlipidemia, unspecified: Secondary | ICD-10-CM

## 2013-02-16 DIAGNOSIS — I2510 Atherosclerotic heart disease of native coronary artery without angina pectoris: Secondary | ICD-10-CM

## 2013-02-16 NOTE — Assessment & Plan Note (Signed)
Continues on statin therapy. Routine lab work with Dr. Manuella Ghazi.

## 2013-02-16 NOTE — Progress Notes (Signed)
    Clinical Summary Cole Mayer is a 78 y.o.male last seen in January 2014. He is doing well, no angina symptoms. He continues to play golf and also exercises at the gym regularly. He reports NYHA class 1-2 dyspnea.  Exercise Cardiolite from February 2014 showed no diagnostic ST segment abnormalities at maximum workload 7 METS, hypertensive response, no chest pain. Perfusion imaging was low risk with evidence of scar in the inferolateral wall, no active ischemia, LVEF 57%.  ECG shows sinus bradycardia with prolonged PR interval.  Medications reviewed.   No Known Allergies  Current Outpatient Prescriptions  Medication Sig Dispense Refill  . aspirin 81 MG EC tablet Take 81 mg by mouth daily.        Marland Kitchen atenolol (TENORMIN) 50 MG tablet Take 50 mg by mouth daily.        Marland Kitchen levothyroxine (SYNTHROID, LEVOTHROID) 125 MCG tablet Take 125 mcg by mouth daily.        . nitroGLYCERIN (NITROSTAT) 0.4 MG SL tablet Place 1 tablet (0.4 mg total) under the tongue every 5 (five) minutes as needed. Up to 3 doses.  25 tablet  3  . ramipril (ALTACE) 10 MG capsule Take 10 mg by mouth daily.        . simvastatin (ZOCOR) 40 MG tablet Take 40 mg by mouth daily.         No current facility-administered medications for this visit.    Past Medical History  Diagnosis Date  . Mixed hyperlipidemia   . Essential hypertension, benign   . Myocardial infarction     1989 (Streptokinase) and 2005  . Coronary atherosclerosis of native coronary artery     DES LAD and DES x3 RCA 2005, LVEF 60%     Social History Mr. Matherne reports that he quit smoking about 25 years ago. His smoking use included Cigarettes. He has a 24 pack-year smoking history. He has quit using smokeless tobacco. His smokeless tobacco use included Chew. Mr. Old reports that he drinks alcohol.  Review of Systems No palpitations, dizziness, syncope. He does bruise easily on aspirin. No major bleeding problems. Otherwise negative.  Physical  Examination Filed Vitals:   02/16/13 1441  BP: 136/75  Pulse: 52   Filed Weights   02/16/13 1441  Weight: 224 lb (101.606 kg)    Appears comfortable at rest. HEENT: Conjunctiva and lids normal, oropharynx clear.  Neck: Supple, no elevated JVP or bruits. No thyromegaly.  Lungs: Clear to auscultation, nonlabored.  Cardiac: Regular rate and rhythm, no S3 or pericardial rub.  Abdomen: Soft, nontender, bowel sounds present.  Skin: Warm and dry.  Extremities: No pitting edema, distal pulses 2+.  Musculoskeletal: No deformities.  Neuropsychiatric: Alert and oriented x3, affect appropriate.    Problem List and Plan   CAD, NATIVE VESSEL Symptomatically stable on medical therapy. ECG reviewed. Followup stress testing from last year was low risk. Continue observation.  CAROTID ARTERY DISEASE Followed by Dr. Manuella Ghazi.  Essential hypertension, benign No change in current regimen. Continue exercise.  HYPERLIPIDEMIA-MIXED Continues on statin therapy. Routine lab work with Dr. Manuella Ghazi.    Satira Sark, M.D., F.A.C.C.

## 2013-02-16 NOTE — Assessment & Plan Note (Signed)
Symptomatically stable on medical therapy. ECG reviewed. Followup stress testing from last year was low risk. Continue observation.

## 2013-02-16 NOTE — Patient Instructions (Signed)

## 2013-02-16 NOTE — Assessment & Plan Note (Signed)
No change in current regimen. Continue exercise.

## 2013-02-16 NOTE — Assessment & Plan Note (Signed)
Followed by Dr. Shah. 

## 2014-02-16 ENCOUNTER — Encounter: Payer: Self-pay | Admitting: *Deleted

## 2014-02-16 ENCOUNTER — Ambulatory Visit (INDEPENDENT_AMBULATORY_CARE_PROVIDER_SITE_OTHER): Payer: 59 | Admitting: Cardiology

## 2014-02-16 ENCOUNTER — Encounter: Payer: Self-pay | Admitting: Cardiology

## 2014-02-16 VITALS — BP 122/70 | HR 60 | Ht 71.0 in | Wt 223.0 lb

## 2014-02-16 DIAGNOSIS — I2510 Atherosclerotic heart disease of native coronary artery without angina pectoris: Secondary | ICD-10-CM

## 2014-02-16 DIAGNOSIS — I10 Essential (primary) hypertension: Secondary | ICD-10-CM

## 2014-02-16 DIAGNOSIS — E782 Mixed hyperlipidemia: Secondary | ICD-10-CM

## 2014-02-16 NOTE — Patient Instructions (Signed)

## 2014-02-16 NOTE — Assessment & Plan Note (Signed)
He continues on Zocor, followed by Dr. Manuella Ghazi. Will request his last lipid panel for review.

## 2014-02-16 NOTE — Assessment & Plan Note (Signed)
No active angina symptoms on medical therapy as outlined. ECG reviewed. He has had ischemic testing within the last 2 years, I recommended that he continue regular diet and exercise plan.

## 2014-02-16 NOTE — Assessment & Plan Note (Signed)
Blood pressure control is good today. No changes were made.

## 2014-02-16 NOTE — Progress Notes (Signed)
Reason for visit: CAD, hyperlipidemia  Clinical Summary Mr. Lama is a 79 y.o.male last seen in January 2015. He comes in for a routine visit. Reports doing well without angina symptoms or nitroglycerin use. He exercises 2-3 days a week at a local gym, also enjoys playing golf. His ECG today shows normal sinus rhythm with prolonged PR interval and incomplete right bundle branch block. He continues on aspirin, beta blocker, ACE inhibitor, and statin.  Exercise Cardiolite from February 2014 showed no diagnostic ST segment abnormalities at maximum workload 7 METS, hypertensive response, no chest pain. Perfusion imaging was low risk with evidence of scar in the inferolateral wall, no active ischemia, LVEF 57%. No clear indication for follow-up testing at this time.  Lipids are followed by Dr. Manuella Ghazi. He reports no side effects with Zocor.   No Known Allergies  Current Outpatient Prescriptions  Medication Sig Dispense Refill  . aspirin 81 MG EC tablet Take 81 mg by mouth daily.      Marland Kitchen atenolol (TENORMIN) 50 MG tablet Take 50 mg by mouth daily.      Marland Kitchen levothyroxine (SYNTHROID, LEVOTHROID) 125 MCG tablet Take 125 mcg by mouth daily.      . nitroGLYCERIN (NITROSTAT) 0.4 MG SL tablet Place 1 tablet (0.4 mg total) under the tongue every 5 (five) minutes as needed. Up to 3 doses. 25 tablet 3  . ramipril (ALTACE) 10 MG capsule Take 10 mg by mouth daily.      . simvastatin (ZOCOR) 40 MG tablet Take 40 mg by mouth daily.       No current facility-administered medications for this visit.    Past Medical History  Diagnosis Date  . Mixed hyperlipidemia   . Essential hypertension, benign   . Myocardial infarction     1989 (Streptokinase) and 2005  . Coronary atherosclerosis of native coronary artery     DES LAD and DES x3 RCA 2005, LVEF 60%     Family History  Problem Relation Age of Onset  . Coronary artery disease Other   . Thyroid disease Father   . Hypertension Father     Social  History Mr. Anacker reports that he quit smoking about 26 years ago. His smoking use included Cigarettes. He has a 24 pack-year smoking history. He has quit using smokeless tobacco. His smokeless tobacco use included Chew. Mr. Sappenfield reports that he drinks alcohol.  Review of Systems Complete review of systems negative except as otherwise outlined in the clinical summary and also the following. No palpitations, dizziness, syncope, claudication. Weight has been stable.  Physical Examination Filed Vitals:   02/16/14 0859  BP: 122/70  Pulse: 60    Wt Readings from Last 3 Encounters:  02/16/14 223 lb (101.152 kg)  02/16/13 224 lb (101.606 kg)  02/20/12 217 lb (98.431 kg)   Appears comfortable at rest. HEENT: Conjunctiva and lids normal, oropharynx clear.  Neck: Supple, no elevated JVP or bruits. No thyromegaly.  Lungs: Clear to auscultation, nonlabored.  Cardiac: Regular rate and rhythm, no S3 or pericardial rub.  Abdomen: Soft, nontender, bowel sounds present.  Skin: Warm and dry.  Extremities: No pitting edema, distal pulses 2+.  Musculoskeletal: No deformities.  Neuropsychiatric: Alert and oriented x3, affect appropriate.    Problem List and Plan   CAD, NATIVE VESSEL No active angina symptoms on medical therapy as outlined. ECG reviewed. He has had ischemic testing within the last 2 years, I recommended that he continue regular diet and exercise plan.   Essential  hypertension, benign Blood pressure control is good today. No changes were made.   Mixed hyperlipidemia He continues on Zocor, followed by Dr. Manuella Ghazi. Will request his last lipid panel for review.     Satira Sark, M.D., F.A.C.C.

## 2014-06-07 ENCOUNTER — Encounter (INDEPENDENT_AMBULATORY_CARE_PROVIDER_SITE_OTHER): Payer: Self-pay | Admitting: *Deleted

## 2014-06-07 ENCOUNTER — Encounter (INDEPENDENT_AMBULATORY_CARE_PROVIDER_SITE_OTHER): Payer: Self-pay

## 2014-10-27 ENCOUNTER — Telehealth (INDEPENDENT_AMBULATORY_CARE_PROVIDER_SITE_OTHER): Payer: Self-pay | Admitting: *Deleted

## 2014-10-27 ENCOUNTER — Other Ambulatory Visit (INDEPENDENT_AMBULATORY_CARE_PROVIDER_SITE_OTHER): Payer: Self-pay | Admitting: *Deleted

## 2014-10-27 DIAGNOSIS — Z86010 Personal history of colonic polyps: Secondary | ICD-10-CM

## 2014-10-27 NOTE — Telephone Encounter (Signed)
Patient need trilyte 

## 2014-10-28 MED ORDER — PEG 3350-KCL-NA BICARB-NACL 420 G PO SOLR: 4000.0000 mL | Freq: Once | ORAL | Status: DC

## 2014-12-21 ENCOUNTER — Telehealth (INDEPENDENT_AMBULATORY_CARE_PROVIDER_SITE_OTHER): Payer: Self-pay | Admitting: *Deleted

## 2014-12-21 NOTE — Telephone Encounter (Signed)
Referring MD/PCP: shah   Procedure: tcs  Reason/Indication:  Hx polyps  Has patient had this procedure before?  Yes, 2011 -- scanned  If so, when, by whom and where?    Is there a family history of colon cancer?  no  Who?  What age when diagnosed?    Is patient diabetic?   no      Does patient have prosthetic heart valve or mechanical valve?  no  Do you have a pacemaker?  no  Has patient ever had endocarditis? no  Has patient had joint replacement within last 12 months?  no  Does patient tend to be constipated or take laxatives? no  Does patient have a history of alcohol/drug use?  no  Is patient on Coumadin, Plavix and/or Aspirin? yes  Medications: see epic  Allergies: nkda  Medication Adjustment: asa 2 days  Procedure date & time: 01/18/15 at 730

## 2014-12-26 NOTE — Telephone Encounter (Signed)
agree

## 2015-01-18 ENCOUNTER — Encounter (HOSPITAL_COMMUNITY): Payer: Self-pay | Admitting: *Deleted

## 2015-01-18 ENCOUNTER — Encounter (HOSPITAL_COMMUNITY)
Admission: RE | Disposition: A | Discharge status: Home / Self Care | Payer: Self-pay | Source: Ambulatory Visit | Attending: Internal Medicine

## 2015-01-18 ENCOUNTER — Ambulatory Visit (HOSPITAL_COMMUNITY)
Admission: RE | Admit: 2015-01-18 | Discharge: 2015-01-18 | Disposition: A | Discharge status: Home / Self Care | Payer: Medicare Other | Source: Ambulatory Visit | Attending: Internal Medicine | Admitting: Internal Medicine

## 2015-01-18 DIAGNOSIS — Z79899 Other long term (current) drug therapy: Secondary | ICD-10-CM | POA: Insufficient documentation

## 2015-01-18 DIAGNOSIS — K644 Residual hemorrhoidal skin tags: Secondary | ICD-10-CM | POA: Diagnosis not present

## 2015-01-18 DIAGNOSIS — Z1211 Encounter for screening for malignant neoplasm of colon: Secondary | ICD-10-CM | POA: Insufficient documentation

## 2015-01-18 DIAGNOSIS — Z86010 Personal history of colonic polyps: Secondary | ICD-10-CM | POA: Insufficient documentation

## 2015-01-18 DIAGNOSIS — I2510 Atherosclerotic heart disease of native coronary artery without angina pectoris: Secondary | ICD-10-CM | POA: Diagnosis not present

## 2015-01-18 DIAGNOSIS — I252 Old myocardial infarction: Secondary | ICD-10-CM | POA: Insufficient documentation

## 2015-01-18 DIAGNOSIS — Z8249 Family history of ischemic heart disease and other diseases of the circulatory system: Secondary | ICD-10-CM | POA: Insufficient documentation

## 2015-01-18 DIAGNOSIS — E785 Hyperlipidemia, unspecified: Secondary | ICD-10-CM | POA: Insufficient documentation

## 2015-01-18 DIAGNOSIS — Z87891 Personal history of nicotine dependence: Secondary | ICD-10-CM | POA: Insufficient documentation

## 2015-01-18 DIAGNOSIS — K5730 Diverticulosis of large intestine without perforation or abscess without bleeding: Secondary | ICD-10-CM | POA: Diagnosis not present

## 2015-01-18 DIAGNOSIS — Z7982 Long term (current) use of aspirin: Secondary | ICD-10-CM | POA: Diagnosis not present

## 2015-01-18 DIAGNOSIS — Z955 Presence of coronary angioplasty implant and graft: Secondary | ICD-10-CM | POA: Insufficient documentation

## 2015-01-18 DIAGNOSIS — I10 Essential (primary) hypertension: Secondary | ICD-10-CM | POA: Insufficient documentation

## 2015-01-18 DIAGNOSIS — D124 Benign neoplasm of descending colon: Secondary | ICD-10-CM | POA: Insufficient documentation

## 2015-01-18 DIAGNOSIS — E039 Hypothyroidism, unspecified: Secondary | ICD-10-CM | POA: Insufficient documentation

## 2015-01-18 DIAGNOSIS — K648 Other hemorrhoids: Secondary | ICD-10-CM | POA: Diagnosis not present

## 2015-01-18 DIAGNOSIS — D123 Benign neoplasm of transverse colon: Secondary | ICD-10-CM | POA: Diagnosis not present

## 2015-01-18 HISTORY — PX: COLONOSCOPY: SHX5424

## 2015-01-18 HISTORY — DX: Hypothyroidism, unspecified: E03.9

## 2015-01-18 SURGERY — COLONOSCOPY
Anesthesia: Moderate Sedation

## 2015-01-18 MED ORDER — MEPERIDINE HCL 50 MG/ML IJ SOLN
INTRAMUSCULAR | Status: DC | PRN
  Administered 2015-01-18 (×2): 25 mg via INTRAVENOUS

## 2015-01-18 MED ORDER — STERILE WATER FOR IRRIGATION IR SOLN
Status: DC | PRN
  Administered 2015-01-18: 08:00:00

## 2015-01-18 MED ORDER — MIDAZOLAM HCL 5 MG/5ML IJ SOLN
INTRAMUSCULAR | Status: DC | PRN
  Administered 2015-01-18: 2 mg via INTRAVENOUS
  Administered 2015-01-18 (×3): 1 mg via INTRAVENOUS

## 2015-01-18 MED ORDER — MIDAZOLAM HCL 5 MG/5ML IJ SOLN
INTRAMUSCULAR | Status: AC
  Filled 2015-01-18: qty 10

## 2015-01-18 MED ORDER — SODIUM CHLORIDE 0.9 % IV SOLN
INTRAVENOUS | Status: DC
  Administered 2015-01-18: 1000 mL via INTRAVENOUS

## 2015-01-18 MED ORDER — MEPERIDINE HCL 50 MG/ML IJ SOLN
INTRAMUSCULAR | Status: AC
  Filled 2015-01-18: qty 1

## 2015-01-18 NOTE — Discharge Instructions (Signed)
Resume usual medications and high fiber diet. No driving for 24 hours. Physician will call with biopsy results. Colonoscopy, Care After Refer to this sheet in the next few weeks. These instructions provide you with information on caring for yourself after your procedure. Your health care provider may also give you more specific instructions. Your treatment has been planned according to current medical practices, but problems sometimes occur. Call your health care provider if you have any problems or questions after your procedure. WHAT TO EXPECT AFTER THE PROCEDURE  After your procedure, it is typical to have the following:  A small amount of blood in your stool.  Moderate amounts of gas and mild abdominal cramping or bloating. HOME CARE INSTRUCTIONS  Do not drive, operate machinery, or sign important documents for 24 hours.  You may shower and resume your regular physical activities, but move at a slower pace for the first 24 hours.  Take frequent rest periods for the first 24 hours.  Walk around or put a warm pack on your abdomen to help reduce abdominal cramping and bloating.  Drink enough fluids to keep your urine clear or pale yellow.  You may resume your normal diet as instructed by your health care provider. Avoid heavy or fried foods that are hard to digest.  Avoid drinking alcohol for 24 hours or as instructed by your health care provider.  Only take over-the-counter or prescription medicines as directed by your health care provider.  If a tissue sample (biopsy) was taken during your procedure:  Do not take aspirin or blood thinners for 7 days, or as instructed by your health care provider.  Do not drink alcohol for 7 days, or as instructed by your health care provider.  Eat soft foods for the first 24 hours. SEEK MEDICAL CARE IF: You have persistent spotting of blood in your stool 2-3 days after the procedure. SEEK IMMEDIATE MEDICAL CARE IF:  You have more than a  small spotting of blood in your stool.  You pass large blood clots in your stool.  Your abdomen is swollen (distended).  You have nausea or vomiting.  You have a fever.  You have increasing abdominal pain that is not relieved with medicine.   This information is not intended to replace advice given to you by your health care provider. Make sure you discuss any questions you have with your health care provider.   Document Released: 08/22/2003 Document Revised: 10/28/2012 Document Reviewed: 09/14/2012 Elsevier Interactive Patient Education 2016 Else[employer] Colon Polyps Polyps are lumps of extra tissue growing inside the body. Polyps can grow in the large intestine (colon). Most colon polyps are noncancerous (benign). However, some colon polyps can become cancerous over time. Polyps that are larger than a pea may be harmful. To be safe, caregivers remove and test all polyps. CAUSES  Polyps form when mutations in the genes cause your cells to grow and divide even though no more tissue is needed. RISK FACTORS There are a number of risk factors that can increase your chances of getting colon polyps. They include:  Being older than 50 years.  Family history of colon polyps or colon cancer.  Long-term colon diseases, such as colitis or Crohn disease.  Being overweight.  Smoking.  Being inactive.  Drinking too much alcohol. SYMPTOMS  Most small polyps do not cause symptoms. If symptoms are present, they may include:  Blood in the stool. The stool may look dark red or black.  Constipation or diarrhea that lasts longer  than 1 week. DIAGNOSIS People often do not know they have polyps until their caregiver finds them during a regular checkup. Your caregiver can use 4 tests to check for polyps:  Digital rectal exam. The caregiver wears gloves and feels inside the rectum. This test would find polyps only in the rectum.  Barium enema. The caregiver puts a liquid called barium into  your rectum before taking X-rays of your colon. Barium makes your colon look white. Polyps are dark, so they are easy to see in the X-ray pictures.  Sigmoidoscopy. A thin, flexible tube (sigmoidoscope) is placed into your rectum. The sigmoidoscope has a light and tiny camera in it. The caregiver uses the sigmoidoscope to look at the last third of your colon.  Colonoscopy. This test is like sigmoidoscopy, but the caregiver looks at the entire colon. This is the most common method for finding and removing polyps. TREATMENT  Any polyps will be removed during a sigmoidoscopy or colonoscopy. The polyps are then tested for cancer. PREVENTION  To help lower your risk of getting more colon polyps:  Eat plenty of fruits and vegetables. Avoid eating fatty foods.  Do not smoke.  Avoid drinking alcohol.  Exercise every day.  Lose weight if recommended by your caregiver.  Eat plenty of calcium and folate. Foods that are rich in calcium include milk, cheese, and broccoli. Foods that are rich in folate include chickpeas, kidney beans, and spinach. HOME CARE INSTRUCTIONS Keep all follow-up appointments as directed by your caregiver. You may need periodic exams to check for polyps. SEEK MEDICAL CARE IF: You notice bleeding during a bowel movement.   This information is not intended to replace advice given to you by your health care provider. Make sure you discuss any questions you have with your health care provider.   Document Released: 10/04/2003 Document Revised: 01/28/2014 Document Reviewed: 03/19/2011 Elsevier Interactive Patient Education [employer].

## 2015-01-18 NOTE — Op Note (Signed)
COLONOSCOPY PROCEDURE REPORT  PATIENT:  Cole Mayer  MR#:  [id number] Birthdate:  04-28-1935, 79 y.o., male Endoscopist:  Dr. Rogene Houston, MD Referred By:  Dr. Monico Blitz, MD Procedure Date: 01/18/2015  Procedure:   Colonoscopy  Indications:  Patient is 79 year old Caucasian male with history of colonic adenomas and is here for surveillance colonoscopy. Last exam was in June 2011 with removal of 4 small tubular adenomas.  Informed Consent:  The procedure and risks were reviewed with the patient and informed consent was obtained.  Medications:  Demerol 50 mg IV Versed 5 mg IV  Description of procedure:  After a digital rectal exam was performed, that colonoscope was advanced from the anus through the rectum and colon to the area of the cecum, ileocecal valve and appendiceal orifice. The cecum was deeply intubated. These structures were well-seen and photographed for the record. From the level of the cecum and ileocecal valve, the scope was slowly and cautiously withdrawn. The mucosal surfaces were carefully surveyed utilizing scope tip to flexion to facilitate fold flattening as needed. The scope was pulled down into the rectum where a thorough exam including retroflexion was performed.  Findings:   Prep excellent. Small polyp was cold snared from transverse colon. 2 small polyps were cold snared from splenic flexure. Small polyp removed with cold biopsy forceps from descending colon. Multiple diverticula at sigmoid colon. Hemorrhoids below the dentate line.   Therapeutic/Diagnostic Maneuvers Performed:  See above  Complications:  None  EBL: None  Cecal Withdrawal Time:  17 minutes  Impression:  Examination performed to cecum. Multiple diverticula at sigmoid colon. Four small polyps removed as above and submitted together. External hemorrhoids.  Recommendations:  Standard instructions given. High fiber diet. I will contact patient with biopsy results and further  recommendations.  Lessly Stigler U  01/18/2015 8:15 AM  CC: Dr. Monico Blitz, MD & Dr. Rayne Du ref. provider found

## 2015-01-18 NOTE — H&P (Signed)
Cole Mayer is an 79 y.o. male.   Chief Complaint: Patient is here for colonoscopy. HPI: Patient is 79 year old Caucasian male with history of colonic adenomas and is here for surveillance colonoscopy. He is at occasional problems with constipation but he denies abdominal pain or rectal bleeding. Last colonoscopy was in June 2011 with removal of 4 small polyps and these are tubular adenomas. Family history is negative for CRC.  Past Medical History  Diagnosis Date  . Mixed hyperlipidemia   . Essential hypertension, benign   . Myocardial infarction ([place])     1989 (Streptokinase) and 2005  . Coronary atherosclerosis of native coronary artery     DES LAD and DES x3 RCA 2005, LVEF 60%   . Hypothyroidism     Past Surgical History  Procedure Laterality Date  . Cholecystectomy    . Tonsillectomy    . Coronary angioplasty      Stents    Family History  Problem Relation Age of Onset  . Coronary artery disease Other   . Thyroid disease Father   . Hypertension Father    Social History:  reports that he quit smoking about 27 years ago. His smoking use included Cigarettes. He has a 24 pack-year smoking history. He has quit using smokeless tobacco. His smokeless tobacco use included Chew. He reports that he drinks alcohol. He reports that he does not use illicit drugs.  Allergies: No Known Allergies  Medications Prior to Admission  Medication Sig Dispense Refill  . aspirin 81 MG EC tablet Take 81 mg by mouth every other day.     Marland Kitchen atenolol (TENORMIN) 50 MG tablet Take 50 mg by mouth daily.      Marland Kitchen levothyroxine (SYNTHROID, LEVOTHROID) 125 MCG tablet Take 125 mcg by mouth daily.      . polyethylene glycol-electrolytes (NULYTELY/GOLYTELY) 420 G solution Take 4,000 mLs by mouth once. 4000 mL 0  . ramipril (ALTACE) 10 MG capsule Take 10 mg by mouth daily.      . simvastatin (ZOCOR) 40 MG tablet Take 40 mg by mouth daily.      . indomethacin (INDOCIN) 50 MG capsule Take 50 mg by mouth daily  as needed for moderate pain.    . nitroGLYCERIN (NITROSTAT) 0.4 MG SL tablet Place 1 tablet (0.4 mg total) under the tongue every 5 (five) minutes as needed. Up to 3 doses. 25 tablet 3  . sodium chloride (OCEAN) 0.65 % SOLN nasal spray Place 1 spray into both nostrils as needed for congestion.      No results found for this or any previous visit (from the past 48 hour(s)). No results found.  ROS  Blood pressure 152/73, pulse 56, temperature 97.7 F (36.5 C), temperature source Oral, resp. rate 21, height 5\' 11"  (1.803 m), weight 213 lb (96.616 kg), SpO2 100 %. Physical Exam  Constitutional: He appears well-developed and well-nourished.  HENT:  Mouth/Throat: Oropharynx is clear and moist.  Eyes: Conjunctivae are normal. No scleral icterus.  Neck: No thyromegaly present.  Cardiovascular: Normal rate, regular rhythm and normal heart sounds.   No murmur heard. Respiratory: Effort normal and breath sounds normal.  GI: He exhibits no distension and no mass. There is no tenderness. There is no guarding.  Musculoskeletal: He exhibits no edema.  Lymphadenopathy:    He has no cervical adenopathy.  Neurological: He is alert.  Skin: Skin is warm and dry.     Assessment/Plan History of colonic adenomas. Surveillance colonoscopy.  Sisto Granillo U 01/18/2015, 7:37 AM

## 2015-01-24 ENCOUNTER — Encounter (HOSPITAL_COMMUNITY): Payer: Self-pay | Admitting: Internal Medicine

## 2015-01-31 ENCOUNTER — Encounter: Payer: Self-pay | Admitting: Cardiology

## 2015-01-31 ENCOUNTER — Ambulatory Visit (INDEPENDENT_AMBULATORY_CARE_PROVIDER_SITE_OTHER): Payer: Medicare Other | Admitting: Cardiology

## 2015-01-31 ENCOUNTER — Encounter: Payer: Self-pay | Admitting: *Deleted

## 2015-01-31 VITALS — BP 150/78 | HR 59 | Ht 71.0 in | Wt 221.0 lb

## 2015-01-31 DIAGNOSIS — E782 Mixed hyperlipidemia: Secondary | ICD-10-CM | POA: Diagnosis not present

## 2015-01-31 DIAGNOSIS — I2510 Atherosclerotic heart disease of native coronary artery without angina pectoris: Secondary | ICD-10-CM

## 2015-01-31 DIAGNOSIS — I10 Essential (primary) hypertension: Secondary | ICD-10-CM | POA: Diagnosis not present

## 2015-01-31 NOTE — Patient Instructions (Signed)

## 2015-01-31 NOTE — Progress Notes (Signed)
Cardiology Office Note  Date: 01/31/2015   ID: Cole Mayer, DOB Sep 10, 1935, MRN [id number]  PCP: Cole Blitz, MD  Primary Cardiologist: Cole Lesches, MD   Chief Complaint  Patient presents with  . Coronary Artery Disease    History of Present Illness: Cole Mayer is a 80 y.o. male last seen in January 2016. He presents for a routine follow-up visit. Still doing reasonably well, no reported chest pain or unusual shortness of breath. He continues to exercise at a local gym using the treadmill and also weights. He tries to go 3 days a week, and also plays golf on the other days.  We discussed his medications which are outlined below. Cardiac regimen includes aspirin, atenolol, Altace, Zocor, and as needed nitroglycerin. He has not needed any nitroglycerin.  He reports having had a physical with lab work with Dr. Manuella Mayer in the last 6 months. We will request his lab work for review.  ECG today shows sinus rhythm with prolonged PR interval and Wenckebach conduction, incomplete right bundle branch block. Stress testing has been done within the last 3 years, results outlined below.  His wife Cole Mayer will be retiring later this month from [employer], recently recovered from an illness.   Past Medical History  Diagnosis Date  . Mixed hyperlipidemia   . Essential hypertension, benign   . Myocardial infarction ([name])     1989 (Streptokinase) and 2005  . Coronary atherosclerosis of native coronary artery     DES LAD and DES x3 RCA 2005, LVEF 60%   . Hypothyroidism     Current Outpatient Prescriptions  Medication Sig Dispense Refill  . aspirin 81 MG EC tablet Take 81 mg by mouth every other day.     Marland Kitchen atenolol (TENORMIN) 50 MG tablet Take 50 mg by mouth daily.      . indomethacin (INDOCIN) 50 MG capsule Take 50 mg by mouth daily as needed for moderate pain.    Marland Kitchen levothyroxine (SYNTHROID, LEVOTHROID) 125 MCG tablet Take 125 mcg by mouth daily.      . nitroGLYCERIN (NITROSTAT) 0.4  MG SL tablet Place 1 tablet (0.4 mg total) under the tongue every 5 (five) minutes as needed. Up to 3 doses. 25 tablet 3  . ramipril (ALTACE) 10 MG capsule Take 10 mg by mouth daily.      . simvastatin (ZOCOR) 40 MG tablet Take 40 mg by mouth daily.      . sodium chloride (OCEAN) 0.65 % SOLN nasal spray Place 1 spray into both nostrils as needed for congestion.     No current facility-administered medications for this visit.   Allergies:  Review of patient's allergies indicates no known allergies.   Social History: The patient  reports that he quit smoking about 27 years ago. His smoking use included Cigarettes. He has a 24 pack-year smoking history. He has quit using smokeless tobacco. His smokeless tobacco use included Chew. He reports that he drinks alcohol. He reports that he does not use illicit drugs.   ROS:  Please see the history of present illness. Otherwise, complete review of systems is positive for arthritic pains in his shoulders and neck.  All other systems are reviewed and negative.   Physical Exam: VS:  BP 150/78 mmHg  Pulse 59  Ht 5\' 11"  (1.803 m)  Wt 221 lb (100.245 kg)  BMI 30.84 kg/m2  SpO2 98%, BMI Body mass index is 30.84 kg/(m^2).  Wt Readings from Last 3 Encounters:  01/31/15 221 lb (  100.245 kg)  01/18/15 213 lb (96.616 kg)  02/16/14 223 lb (101.152 kg)    General: Overweight male, appears comfortable at rest. HEENT: Conjunctiva and lids normal, oropharynx clear with moist mucosa. Neck: Supple, no elevated JVP or carotid bruits, no thyromegaly. Lungs: Clear to auscultation, nonlabored breathing at rest. Cardiac: Regular rate and rhythm, no S3 or significant systolic murmur, no pericardial rub. Abdomen: Soft, nontender, bowel sounds present, no guarding or rebound. Extremities: No pitting edema, distal pulses 2+.  ECG: Tracing from 02/16/2014 showed sinus rhythm with prolonged PR interval and incomplete right bundle-branch block.  Other Studies Reviewed  Today:  Exercise Cardiolite from February 2014 showed no diagnostic ST segment abnormalities at maximum workload 7 METS, hypertensive response, no chest pain. Perfusion imaging was low risk with evidence of scar in the inferolateral wall, no active ischemia, LVEF 57%.  Assessment and Plan:  1. Symptomatically stable CAD status post DES to the LAD and RCA in 2005. Stress testing from 2014 showed no ischemic territories with normal LVEF. He does not report any angina symptoms on current medical regimen. I have encouraged him to continue with his regular exercise plan.  2. Essential hypertension, blood pressure is mildly elevated today. He reports compliance with his medications. Discussed exercise and diet.  3. Hyperlipidemia, on Zocor. Requesting most recent lab work from Dr. Manuella Mayer.  Current medicines were reviewed with the patient today.   Orders Placed This Encounter  Procedures  . EKG 12-Lead    Disposition: FU with me in 1 year.   Signed, Cole Sark, MD, [facility] 01/31/2015 8:18 AM    [facility] [place] [facility] [place], [place], [place] [address] Phone: [phone]; Fax: [phone]

## 2016-02-05 ENCOUNTER — Encounter: Payer: Self-pay | Admitting: *Deleted

## 2016-02-06 ENCOUNTER — Ambulatory Visit (INDEPENDENT_AMBULATORY_CARE_PROVIDER_SITE_OTHER): Payer: Medicare Other | Admitting: Cardiology

## 2016-02-06 ENCOUNTER — Encounter: Payer: Self-pay | Admitting: Cardiology

## 2016-02-06 VITALS — BP 159/82 | HR 56 | Ht 71.0 in | Wt 219.0 lb

## 2016-02-06 DIAGNOSIS — E039 Hypothyroidism, unspecified: Secondary | ICD-10-CM | POA: Diagnosis not present

## 2016-02-06 DIAGNOSIS — E782 Mixed hyperlipidemia: Secondary | ICD-10-CM | POA: Diagnosis not present

## 2016-02-06 DIAGNOSIS — I10 Essential (primary) hypertension: Secondary | ICD-10-CM

## 2016-02-06 DIAGNOSIS — I2510 Atherosclerotic heart disease of native coronary artery without angina pectoris: Secondary | ICD-10-CM | POA: Diagnosis not present

## 2016-02-06 NOTE — Progress Notes (Signed)
Cardiology Office Note  Date: 02/06/2016   ID: FOUNT FUGERE, DOB January 05, 1936, MRN [id number]  PCP: Monico Blitz, MD  Primary Cardiologist: Rozann Lesches, MD   Chief Complaint  Patient presents with  . Coronary Artery Disease    History of Present Illness: ADIEL HORNG is a 81 y.o. male last seen in January 2017. He presents for a routine follow-up visit. Reports no angina or nitroglycerin use. Still satisfied with his overall functional capacity. He enjoys working outdoors, still exercises at a [employer] 3 days a week. Uses a treadmill and other weights.  I reviewed his medications which are outlined below. Cardiac regimen includes aspirin, Toprol-XL, Altace, Zocor, and as needed nitroglycerin. He follows with Dr. Manuella Ghazi regularly for lab work. I reviewed his ECG today which shows sinus bradycardia with prolonged PR interval and incomplete right bundle-branch block.  Most recent stress testing was in February 2014 as outlined below. We discussed the results today. He remains comfortable with observation now on medical therapy in the absence of angina. We did discuss considering a stress test around the time of his next visit however.  Past Medical History:  Diagnosis Date  . Coronary atherosclerosis of native coronary artery    DES LAD and DES x3 RCA 2005, LVEF 60%   . Essential hypertension, benign   . Hypothyroidism   . Mixed hyperlipidemia   . Myocardial infarction    1989 (Streptokinase) and 2005    Past Surgical History:  Procedure Laterality Date  . CHOLECYSTECTOMY    . COLONOSCOPY N/A 01/18/2015   Procedure: COLONOSCOPY;  Surgeon: Rogene Houston, MD;  [facility];  Service: Endoscopy;  Laterality: N/A;  730  . CORONARY ANGIOPLASTY     Stents  . TONSILLECTOMY      Current Outpatient Prescriptions  Medication Sig Dispense Refill  . amoxicillin-clavulanate (AUGMENTIN) 875-125 MG tablet Take 1 tablet by mouth 2 (two) times daily.    Marland Kitchen aspirin 81 MG  EC tablet Take 81 mg by mouth daily.     . indomethacin (INDOCIN) 50 MG capsule Take 50 mg by mouth daily as needed for moderate pain.    Marland Kitchen levothyroxine (SYNTHROID, LEVOTHROID) 125 MCG tablet Take 125 mcg by mouth daily.      . metoprolol succinate (TOPROL-XL) 50 MG 24 hr tablet Take 50 mg by mouth daily. Take with or immediately following a meal.    . nitroGLYCERIN (NITROSTAT) 0.4 MG SL tablet Place 1 tablet (0.4 mg total) under the tongue every 5 (five) minutes as needed. Up to 3 doses. 25 tablet 3  . ramipril (ALTACE) 10 MG capsule Take 10 mg by mouth daily.      . simvastatin (ZOCOR) 40 MG tablet Take 40 mg by mouth daily.      . sodium chloride (OCEAN) 0.65 % SOLN nasal spray Place 1 spray into both nostrils as needed for congestion.     No current facility-administered medications for this visit.    Allergies:  Patient has no known allergies.   Social History: The patient  reports that he quit smoking about 28 years ago. His smoking use included Cigarettes. He started smoking about 58 years ago. He has a 24.00 pack-year smoking history. He has quit using smokeless tobacco. His smokeless tobacco use included Chew. He reports that he drinks alcohol. He reports that he does not use drugs.   ROS:  Please see the history of present illness. Otherwise, complete review of systems is positive for none.  All other systems are reviewed and negative.   Physical Exam: VS:  BP (!) 159/82   Pulse (!) 56   Ht 5\' 11"  (1.803 m)   Wt 219 lb (99.3 kg)   BMI 30.54 kg/m , BMI Body mass index is 30.54 kg/m.  Wt Readings from Last 3 Encounters:  02/06/16 219 lb (99.3 kg)  01/31/15 221 lb (100.2 kg)  01/18/15 213 lb (96.6 kg)    General: Overweight male, appears comfortable at rest. HEENT: Conjunctiva and lids normal, oropharynx clear with moist mucosa. Neck: Supple, no elevated JVP or carotid bruits, no thyromegaly. Lungs: Clear to auscultation, nonlabored breathing at rest. Cardiac: Regular rate  and rhythm, no S3 or significant systolic murmur, no pericardial rub. Abdomen: Soft, nontender, bowel sounds present, no guarding or rebound. Extremities: No pitting edema, distal pulses 2+. Skin: Warm and dry. Musculoskeletal: No kyphosis. Neuropsychiatric: Alert and oriented 3, affect appropriate.  ECG: I personally reviewed the tracing from 01/31/2015 which showed sinus rhythm with prolonged PR interval and went bundle branch block.  Recent Labwork:  September 2016: BUN 18, creatinine 1.5, potassium 5.0, AST 22, ALT 16, hemoglobin 13.0, pulse 221, cholesterol 152, triglycerides 134, HDL 47, LDL 78, TSH 0.55   Other Studies Reviewed Today:  Exercise Cardiolite from February 2014 showed no diagnostic ST segment abnormalities at maximum workload 7 METS, hypertensive response, no chest pain. Perfusion imaging was low risk with evidence of scar in the inferolateral wall, no active ischemia, LVEF 57%.  Assessment and Plan:  1. CAD status post prior DES interventions to the LAD and RCA as outlined above. He reports no angina symptoms on medical therapy. ECG reviewed and stable. He remains comfortable with observation on medical therapy, we did discuss warning signs and symptoms. Anticipate follow-up stress test around the time of his next visit.  2. Hyperlipidemia, continues on Zocor with follow-up per Dr. Manuella Ghazi. LDL has been recently well-controlled over time.  3. Essential hypertension, blood pressure elevated today. He reports compliance with his medications. We discussed weight loss and salt restriction.  4. Hyperthyroidism, on Synthroid.  Current medicines were reviewed with the patient today.   Orders Placed This Encounter  Procedures  . EKG 12-Lead    Disposition: Follow-up in one year, sooner if needed.  Signed, Satira Sark, MD, [facility] 02/06/2016 8:13 AM    [facility] [place], [place], [place] [address] Phone: [phone]; Fax:  [phone]

## 2016-02-06 NOTE — Patient Instructions (Signed)

## 2017-01-29 ENCOUNTER — Other Ambulatory Visit: Payer: Self-pay | Admitting: *Deleted

## 2017-01-29 MED ORDER — NITROGLYCERIN 0.4 MG SL SUBL: 0.4000 mg | SUBLINGUAL_TABLET | SUBLINGUAL | 0 refills | Status: DC | PRN

## 2017-03-06 NOTE — Progress Notes (Signed)
Cardiology Office Note  Date: 03/07/2017   ID: Cole, Mayer Cole Mayer, MRN [id number]  PCP: Cole Blitz, MD  Primary Cardiologist: Cole Lesches, MD   Chief Complaint  Patient presents with  . Coronary Artery Disease    History of Present Illness: Cole Mayer is a 82 y.o. male last seen in January 2018.  He presents for a routine follow-up visit.  States that he has had no major change in stamina, continues to play golf, reports no angina or nitroglycerin use.  He has had some venous varicosities in his lower legs, spider veins that tingle sometimes.  No progressive leg swelling or claudication.  We discussed obtaining a follow-up surveillance stress test, last evaluation was in 2014 as outlined below.  Cardiac regimen is unchanged and outlined below.  He reports no intolerances.  We went through all of his medications today.  I personally reviewed his ECG today which shows sinus rhythm with prolonged PR interval, right bundle branch block, old inferior infarct pattern, and nonspecific ST changes.  We are requesting his last set of lab work from Dr. Manuella Mayer.  Past Medical History:  Diagnosis Date  . Coronary atherosclerosis of native coronary artery    DES LAD and DES x3 RCA 2005, LVEF 60%   . Essential hypertension, benign   . Hypothyroidism   . Mixed hyperlipidemia   . Myocardial infarction ([place])    1989 (Streptokinase) and 2005    Past Surgical History:  Procedure Laterality Date  . CHOLECYSTECTOMY    . COLONOSCOPY N/A 01/18/2015   Procedure: COLONOSCOPY;  Surgeon: Cole Houston, MD;  [facility];  Service: Endoscopy;  Laterality: N/A;  730  . CORONARY ANGIOPLASTY     Stents  . TONSILLECTOMY      Current Outpatient Medications  Medication Sig Dispense Refill  . aspirin 81 MG EC tablet Take 81 mg by mouth daily.     Marland Kitchen levothyroxine (SYNTHROID, LEVOTHROID) 125 MCG tablet Take 125 mcg by mouth daily.      . metoprolol succinate (TOPROL-XL)  50 MG 24 hr tablet Take 50 mg by mouth daily. Take with or immediately following a meal.    . nitroGLYCERIN (NITROSTAT) 0.4 MG SL tablet Place 1 tablet (0.4 mg total) under the tongue every 5 (five) minutes as needed. Up to 3 doses. 25 tablet 0  . ramipril (ALTACE) 10 MG capsule Take 10 mg by mouth daily.      . simvastatin (ZOCOR) 40 MG tablet Take 40 mg by mouth daily.      . sodium chloride (OCEAN) 0.65 % SOLN nasal spray Place 1 spray into both nostrils as needed for congestion.     No current facility-administered medications for this visit.    Allergies:  Patient has no known allergies.   Social History: The patient  reports that he quit smoking about 29 years ago. His smoking use included cigarettes. He started smoking about 59 years ago. He has a 24.00 pack-year smoking history. He has quit using smokeless tobacco. His smokeless tobacco use included chew. He reports that he drinks alcohol. He reports that he does not use drugs.   ROS:  Please see the history of present illness. Otherwise, complete review of systems is positive for hearing loss.  All other systems are reviewed and negative.   Physical Exam: VS:  BP (!) 145/82   Pulse 60   Ht 5\' 11"  (1.803 m)   Wt 220 lb (99.8 kg)  SpO2 98%   BMI 30.68 kg/m , BMI Body mass index is 30.68 kg/m.  Wt Readings from Last 3 Encounters:  03/07/17 220 lb (99.8 kg)  02/06/16 219 lb (99.3 kg)  01/31/15 221 lb (100.2 kg)    General: Patient appears comfortable at rest. HEENT: Conjunctiva and lids normal, oropharynx clear. Neck: Supple, no elevated JVP or carotid bruits, no thyromegaly. Lungs: Clear to auscultation, nonlabored breathing at rest. Cardiac: Regular rate and rhythm, no S3 or significant systolic murmur, no pericardial rub. Abdomen: Soft, nontender, no hepatomegaly, bowel sounds present, no guarding or rebound. Extremities: No pitting edema, distal pulses 2+. Skin: Warm and dry. Musculoskeletal: No  kyphosis. Neuropsychiatric: Alert and oriented x3, affect grossly appropriate.  ECG: I personally reviewed the tracing from 02/06/2016 which showed sinus bradycardia with prolonged PR interval, right bundle branch block, and nonspecific ST changes.  Recent Labwork:  September 2016: BUN 18, creatinine 1.5, potassium 5.0, AST 22, ALT 16, hemoglobin 13.0, pulse 221, cholesterol 152, triglycerides 134, HDL 47, LDL 78, TSH 0.55   Other Studies Reviewed Today:  Exercise Cardiolite from February 2014 showed no diagnostic ST segment abnormalities at maximum workload 7 METS, hypertensive response, no chest pain. Perfusion imaging was low risk with evidence of scar in the inferolateral wall, no active ischemia, LVEF 57%.  Assessment and Plan:  1.. CAD with history of DES interventions to the LAD and RCA as discussed above.  Last ischemic evaluation was in 2014, will plan to reevaluate ischemic burden with Cole Mayer on medical therapy.  ECG otherwise stable.  2.  Mixed hyperlipidemia, on Zocor.  Requesting recent lab work from Dr. Manuella Mayer.  3.  Essential hypertension, no changes made to present regimen.  4.  Hypothyroidism, on Synthroid.  Current medicines were reviewed with the patient today.   Orders Placed This Encounter  Procedures  . EKG 12-Lead    Disposition: Follow-up in 1 year.  Signed, Satira Sark, MD, [facility] 03/07/2017 4:08 PM    [facility] [facility] [place], [place], [place] [address] Phone: [phone]; Fax: [phone]

## 2017-03-07 ENCOUNTER — Encounter: Payer: Self-pay | Admitting: *Deleted

## 2017-03-07 ENCOUNTER — Encounter: Payer: Self-pay | Admitting: Cardiology

## 2017-03-07 ENCOUNTER — Telehealth: Payer: Self-pay | Admitting: Cardiology

## 2017-03-07 ENCOUNTER — Ambulatory Visit: Payer: Medicare Other | Admitting: Cardiology

## 2017-03-07 VITALS — BP 145/82 | HR 60 | Ht 71.0 in | Wt 220.0 lb

## 2017-03-07 DIAGNOSIS — I10 Essential (primary) hypertension: Secondary | ICD-10-CM | POA: Diagnosis not present

## 2017-03-07 DIAGNOSIS — I25119 Atherosclerotic heart disease of native coronary artery with unspecified angina pectoris: Secondary | ICD-10-CM

## 2017-03-07 DIAGNOSIS — E039 Hypothyroidism, unspecified: Secondary | ICD-10-CM

## 2017-03-07 DIAGNOSIS — E782 Mixed hyperlipidemia: Secondary | ICD-10-CM

## 2017-03-07 NOTE — Telephone Encounter (Signed)
[facility]   lexiscan - CAD w/ unspecified angina/[place]/srs arrive 9:15am

## 2017-03-07 NOTE — Patient Instructions (Addendum)
Medication Instructions:  Continue all current medications.  Labwork: none  Testing/Procedures:  Your physician has requested that you have a lexiscan myoview. For further information please visit [email or web address]. Please follow instruction sheet, as given.  Office will contact with results via phone or letter.    Follow-Up: Your physician wants you to follow up in:  1 year.  You will receive a reminder letter in the mail one-two months in advance.  If you don't receive a letter, please call our office to schedule the follow up appointment   Any Other Special Instructions Will Be Listed Below (If Applicable).  If you need a refill on your cardiac medications before your next appointment, please call your pharmacy.  

## 2017-03-18 ENCOUNTER — Encounter (HOSPITAL_COMMUNITY): Payer: Self-pay

## 2017-03-18 ENCOUNTER — Telehealth: Payer: Self-pay

## 2017-03-18 ENCOUNTER — Encounter (HOSPITAL_BASED_OUTPATIENT_CLINIC_OR_DEPARTMENT_OTHER)
Admission: RE | Admit: 2017-03-18 | Discharge: 2017-03-18 | Disposition: A | Discharge status: Home / Self Care | Payer: Medicare Other | Source: Ambulatory Visit | Attending: Cardiology | Admitting: Cardiology

## 2017-03-18 ENCOUNTER — Ambulatory Visit (HOSPITAL_COMMUNITY)
Admission: RE | Admit: 2017-03-18 | Discharge: 2017-03-18 | Disposition: A | Discharge status: Home / Self Care | Payer: Medicare Other | Source: Ambulatory Visit | Attending: Cardiology | Admitting: Cardiology

## 2017-03-18 DIAGNOSIS — I25119 Atherosclerotic heart disease of native coronary artery with unspecified angina pectoris: Secondary | ICD-10-CM | POA: Diagnosis not present

## 2017-03-18 DIAGNOSIS — R9439 Abnormal result of other cardiovascular function study: Secondary | ICD-10-CM | POA: Diagnosis not present

## 2017-03-18 LAB — NM MYOCAR MULTI W/SPECT W/WALL MOTION / EF
CHL CUP NUCLEAR SRS: 5
CHL CUP RESTING HR STRESS: 58 {beats}/min
LV sys vol: 41 mL
LVDIAVOL: 115 mL (ref 62–150)
NUC STRESS TID: 0.92
Peak HR: 87 {beats}/min
RATE: 0.45
SDS: 2
SSS: 7

## 2017-03-18 MED ORDER — SODIUM CHLORIDE 0.9% FLUSH
INTRAVENOUS | Status: AC
  Administered 2017-03-18: 10 mL via INTRAVENOUS
  Filled 2017-03-18: qty 10

## 2017-03-18 MED ORDER — REGADENOSON 0.4 MG/5ML IV SOLN
INTRAVENOUS | Status: AC
  Administered 2017-03-18: 0.4 mg via INTRAVENOUS
  Filled 2017-03-18: qty 5

## 2017-03-18 MED ORDER — TECHNETIUM TC 99M TETROFOSMIN IV KIT
10.0000 | PACK | Freq: Once | INTRAVENOUS | Status: AC | PRN
  Administered 2017-03-18: 10.7 via INTRAVENOUS

## 2017-03-18 MED ORDER — TECHNETIUM TC 99M TETROFOSMIN IV KIT
30.0000 | PACK | Freq: Once | INTRAVENOUS | Status: AC | PRN
  Administered 2017-03-18: 33 via INTRAVENOUS

## 2017-03-18 NOTE — Telephone Encounter (Signed)
-----   Message from Merlene Laughter, LPN sent at 0/34/0352  1:00 PM EST -----   ----- Message ----- From: Satira Sark, MD Sent: 03/18/2017  12:59 PM To: Merlene Laughter, LPN  Results reviewed.  Please let him know that I reviewed your stress test results, overall low risk findings and reassuring in the absence of angina symptoms on medical therapy.  We will continue with her current plan. A copy of this test should be forwarded to Monico Blitz, MD.

## 2017-03-18 NOTE — Telephone Encounter (Signed)
Patient notified. Routed to PCP 

## 2017-06-05 ENCOUNTER — Ambulatory Visit (INDEPENDENT_AMBULATORY_CARE_PROVIDER_SITE_OTHER): Payer: Medicare Other | Admitting: Otolaryngology

## 2017-06-05 DIAGNOSIS — H9313 Tinnitus, bilateral: Secondary | ICD-10-CM

## 2017-06-05 DIAGNOSIS — H903 Sensorineural hearing loss, bilateral: Secondary | ICD-10-CM

## 2017-06-05 DIAGNOSIS — J342 Deviated nasal septum: Secondary | ICD-10-CM

## 2017-06-05 DIAGNOSIS — R42 Dizziness and giddiness: Secondary | ICD-10-CM | POA: Diagnosis not present

## 2017-07-09 ENCOUNTER — Other Ambulatory Visit: Payer: Self-pay

## 2017-07-09 DIAGNOSIS — R609 Edema, unspecified: Secondary | ICD-10-CM

## 2017-07-11 ENCOUNTER — Ambulatory Visit (HOSPITAL_COMMUNITY)
Admission: RE | Admit: 2017-07-11 | Discharge: 2017-07-11 | Disposition: A | Discharge status: Home / Self Care | Payer: Medicare Other | Source: Ambulatory Visit | Attending: Vascular Surgery | Admitting: Vascular Surgery

## 2017-07-11 ENCOUNTER — Encounter: Payer: Self-pay | Admitting: Vascular Surgery

## 2017-07-11 ENCOUNTER — Ambulatory Visit: Payer: Medicare Other | Admitting: Vascular Surgery

## 2017-07-11 VITALS — BP 187/80 | HR 101 | Resp 16 | Ht 71.0 in | Wt 213.0 lb

## 2017-07-11 DIAGNOSIS — R609 Edema, unspecified: Secondary | ICD-10-CM

## 2017-07-11 NOTE — Progress Notes (Signed)
Patient ID: Cole Mayer, male   DOB: 01/22/1935, 82 y.o.   MRN: [id number]  Reason for Consult: Varicose Veins and Leg Swelling   Referred by Monico Blitz, MD  Subjective:     HPI:  Cole Mayer is a 82 y.o. male presents for evaluation bilateral lower extremity swelling.  He has right greater than left swelling.  States that he continues to stay active in the leg swells more when he is active.  He is not wearing compression stockings.  He does not take any diuretics.  He is not on aspirin.  He is never had DVT either personal or family history.  He is never had intervention of his bilateral lower extremity veins.  Past Medical History:  Diagnosis Date  . Coronary atherosclerosis of native coronary artery    DES LAD and DES x3 RCA 2005, LVEF 60%   . Essential hypertension, benign   . Hypothyroidism   . Mixed hyperlipidemia   . Myocardial infarction ([place])    1989 (Streptokinase) and 2005   Family History  Problem Relation Age of Onset  . Coronary artery disease Other   . Thyroid disease Father   . Hypertension Father    Past Surgical History:  Procedure Laterality Date  . CHOLECYSTECTOMY    . COLONOSCOPY N/A 01/18/2015   Procedure: COLONOSCOPY;  Surgeon: Rogene Houston, MD;  [facility];  Service: Endoscopy;  Laterality: N/A;  730  . CORONARY ANGIOPLASTY     Stents  . TONSILLECTOMY      Short Social History:  Social History   Tobacco Use  . Smoking status: Former Smoker    Packs/day: 0.80    Years: 30.00    Pack years: 24.00    Types: Cigarettes    Start date: 09/08/1957    Last attempt to quit: 01/05/1988    Years since quitting: 29.5  . Smokeless tobacco: Former Systems developer    Types: Chew  . Tobacco comment: chewed a little  Substance Use Topics  . Alcohol use: Yes    Comment: Occasional    No Known Allergies  Current Outpatient Medications  Medication Sig Dispense Refill  . fluticasone (FLONASE) 50 MCG/ACT nasal spray Place 2 sprays into  both nostrils daily.  99  . levothyroxine (SYNTHROID, LEVOTHROID) 125 MCG tablet Take 125 mcg by mouth daily.      . meclizine (ANTIVERT) 25 MG tablet TAKE 1 TABLET BY MOUTH EVERY 8 HOURS AS NEEDED FOR dizziness  0  . metoprolol succinate (TOPROL-XL) 50 MG 24 hr tablet Take 50 mg by mouth daily. Take with or immediately following a meal.    . nitroGLYCERIN (NITROSTAT) 0.4 MG SL tablet Place 1 tablet (0.4 mg total) under the tongue every 5 (five) minutes as needed. Up to 3 doses. 25 tablet 0  . ramipril (ALTACE) 10 MG capsule Take 10 mg by mouth daily.      . simvastatin (ZOCOR) 40 MG tablet Take 40 mg by mouth daily.      . sodium chloride (OCEAN) 0.65 % SOLN nasal spray Place 1 spray into both nostrils as needed for congestion.     No current facility-administered medications for this visit.     Review of Systems  Constitutional:  Constitutional negative. HENT: HENT negative.  Eyes: Eyes negative.  Cardiovascular: Positive for leg swelling.  GI: Gastrointestinal negative.  GU: Positive for hematuria.  Musculoskeletal: Musculoskeletal negative.  Skin: Skin negative.  Neurological: Neurological negative. Psychiatric: Psychiatric negative.  Objective:  Objective   Vitals:   07/11/17 1315  BP: (!) 180/79  Pulse: 63  Resp: 16  SpO2: 99%  Weight: 213 lb (96.6 kg)  Height: 5\' 11"  (1.803 m)   Body mass index is 29.71 kg/m.  Physical Exam  Constitutional: He appears well-developed.  HENT:  Head: Normocephalic.  Eyes: Pupils are equal, round, and reactive to light.  Neck: Normal range of motion. Neck supple.  Cardiovascular: Normal rate.  Pulses:      Popliteal pulses are 2+ on the right side, and 2+ on the left side.       Dorsalis pedis pulses are 2+ on the right side, and 2+ on the left side.  Abdominal: Soft. He exhibits no mass.  Musculoskeletal: He exhibits edema.  Has significant spider veins in his right lower extremity as well as nonpitting edema right  greater than left.  Neurological: He is alert.  Skin: Skin is warm and dry. Capillary refill takes less than 2 seconds.  Psychiatric: He has a normal mood and affect. His behavior is normal. Judgment and thought content normal.    Data: I have independently interpreted his venous reflux study which demonstrates greater saphenous vein on the left 0.53 cm and on the right is 0.76 cm.  He does not have any reflux in his right greater saphenous vein on the left it is up to 3.6 seconds of the knee.     Assessment/Plan:     82 year old male presents for evaluation of right lower extremity swelling.  I do not think that his swelling is related to his venous reflux given that he does not seem to have any reflux on the right where most of his swelling is.  He does have some spider veins there that he is uninterested in treating.  If he decides that he would like these treated we could evaluate him.  I recommended compression stockings given him information regarding this and he will check to get them purchase in [place], [place].  He demonstrates good understanding can follow-up in a as needed basis.     Waynetta Sandy MD [facility]

## 2017-10-14 ENCOUNTER — Telehealth: Payer: Self-pay | Admitting: *Deleted

## 2017-10-14 NOTE — Telephone Encounter (Signed)
Cole Mayer is calling on behalf of her husband Cole Mayer who was seen at [facility] on 07-11-2017 by Dr. Donzetta Matters for lower extremity edema R>L with venous reflux study performed at [facility] on 07-11-2017. In office note on 07-11-2017, Dr. Donzetta Matters stated he" did not think that his swelling is related to his venous reflux given that he does not seem to have any reflux on the right where most of his swelling is."  Currently,  Cole Mayer states that Cole Mayer's right foot was swollen and that he was seen at [facility] in [place] ,[place] on 10-11-2017. Cole Mayer states that "a DVT ultrasound was done in the ER on Saturday, September 21-2019 and that no clot was found."  She states that Cole Mayer is seeing his primary care physician today to assess his kidney functions.  She states that he is wearing his compression hose and elevating his legs when sitting.  She states he does not have pain, redness, or skin breakdown in the area of swelling. Offered appointment to see Dr. Donzetta Matters.  Cole Mayer will call after Cole Mayer  appointment with primary care physician today if they want to schedule an appointment with Dr. Donzetta Matters.

## 2018-03-06 NOTE — Progress Notes (Signed)
Cardiology Office Note  Date: 03/10/2018   ID: Cole, Mayer 25-Jul-1935, MRN [id number]  PCP: Monico Blitz, MD  Primary Cardiologist: Rozann Lesches, MD   Chief Complaint  Patient presents with  . Coronary Artery Disease    History of Present Illness: Cole Mayer is an 83 y.o. male last seen in February 2019.  He is here for a routine follow-up visit.  He does not report any angina symptoms or nitroglycerin use, tries to stay active with ADLs and other house chores.  He does not report any orthopnea or PND, no progressive leg swelling.  He uses compression stockings with venous varicosities and spider veins.  Follow-up Lexiscan Myoview from February of last year was low risk as detailed below.  We discussed the results today.  Cardiac regimen includes a baby aspirin, Toprol-XL, Altase, Zocor, and as needed nitroglycerin.  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  He continues to follow with Dr. Manuella Mayer.  Past Medical History:  Diagnosis Date  . Coronary atherosclerosis of native coronary artery    DES LAD and DES x3 RCA 2005, LVEF 60%   . Essential hypertension, benign   . Hypothyroidism   . Mixed hyperlipidemia   . Myocardial infarction ([place])    1989 (Streptokinase) and 2005    Past Surgical History:  Procedure Laterality Date  . CHOLECYSTECTOMY    . COLONOSCOPY N/A 01/18/2015   Procedure: COLONOSCOPY;  Surgeon: Rogene Houston, MD;  [facility];  Service: Endoscopy;  Laterality: N/A;  730  . CORONARY ANGIOPLASTY     Stents  . TONSILLECTOMY      Current Outpatient Medications  Medication Sig Dispense Refill  . fluticasone (FLONASE) 50 MCG/ACT nasal spray Place 2 sprays into both nostrils daily.  99  . levothyroxine (SYNTHROID, LEVOTHROID) 112 MCG tablet Take 112 mcg by mouth daily before breakfast.    . metoprolol succinate (TOPROL-XL) 50  MG 24 hr tablet Take 50 mg by mouth daily. Take with or immediately following a meal.    . nitroGLYCERIN (NITROSTAT) 0.4 MG SL tablet Place 1 tablet (0.4 mg total) under the tongue every 5 (five) minutes as needed. Up to 3 doses. 25 tablet 0  . ramipril (ALTACE) 10 MG capsule Take 10 mg by mouth daily.      . simvastatin (ZOCOR) 40 MG tablet Take 40 mg by mouth daily.      . sodium chloride (OCEAN) 0.65 % SOLN nasal spray Place 1 spray into both nostrils as needed for congestion.     No current facility-administered medications for this visit.    Allergies:  Patient has no known allergies.   Social History: The patient  reports that he quit smoking about 30 years ago. His smoking use included cigarettes. He started smoking about 60 years ago. He has a 24.00 pack-year smoking history. He has quit using smokeless tobacco.  His smokeless tobacco use included chew. He reports current alcohol use. He reports that he does not use drugs.   ROS:  Please see the history of present illness. Otherwise, complete review of systems is positive for hearing loss.  All other systems are reviewed and negative.   Physical Exam: VS:  BP 124/80   Pulse 60   Ht 5\' 9"  (1.753 m)   Wt 209 lb (94.8 kg)   SpO2 96%   BMI 30.86 kg/m , BMI Body  mass index is 30.86 kg/m.  Wt Readings from Last 3 Encounters:  03/10/18 209 lb (94.8 kg)  07/11/17 213 lb (96.6 kg)  03/07/17 220 lb (99.8 kg)    General: Patient appears comfortable at rest. HEENT: Conjunctiva and lids normal, oropharynx clear. Neck: Supple, no elevated JVP or carotid bruits, no thyromegaly. Lungs: Clear to auscultation, nonlabored breathing at rest. Cardiac: Regular rate and rhythm, no S3 or significant systolic murmur. Abdomen: Soft, nontender, bowel sounds present. Extremities: No pitting edema with compression stockings, distal pulses 2+. Skin: Warm and dry. Musculoskeletal: No kyphosis.  Neuropsychiatric: Alert and oriented x3, affect grossly  appropriate.  ECG: I personally reviewed the tracing from 03/07/2017 which shows sinus rhythm with prolonged PR interval, right bundle branch block, old inferior infarct pattern, nonspecific ST changes.  Recent Labwork:  September 2016: BUN 18, creatinine 1.5,potassium 5.0, AST 22, ALT 16, hemoglobin 13.0, pulse 221, cholesterol 152, triglycerides 134, HDL 47, LDL 78, TSH 0.55  Other Studies Reviewed Today:  Lexiscan Myoview 03/18/2017:  T wave inversion was noted during stress in the V3, V4 and III leads.  Defect 1: There is a medium defect of moderate severity present in the mid inferior, mid inferolateral, apical inferior and apical lateral location.  Findings may be consistent with prior myocardial infarction with a very mild degree of peri-infarct ischemia. However, variable soft tissue attenuation is also likely contributing to this defect.  Nuclear stress EF: 64%.  Overall, this is a low risk study.  Assessment and Plan:  1.  CAD status post DES interventions to the LAD and RCA in 2005.  He reports no active angina on medical therapy and underwent Myoview study last year which was low risk.  ECG reviewed and stable.  Continue with observation.  2.  Mixed hyperlipidemia, he continues on Zocor with follow-up by Dr. Manuella Mayer.  3.  Essential hypertension, blood pressure is well controlled today.  No changes made to current regimen.  4.  Hypothyroidism, on Synthroid with follow-up by Dr. Manuella Mayer.  Current medicines were reviewed with the patient today.   Orders Placed This Encounter  Procedures  . EKG 12-Lead    Disposition: Follow-up in 1 year.  Signed, Satira Sark, MD, [facility] 03/10/2018 9:37 AM    [facility] at [place], [place], [place] [address] Phone: [phone]; Fax: [phone]

## 2018-03-10 ENCOUNTER — Ambulatory Visit: Payer: Medicare Other | Admitting: Cardiology

## 2018-03-10 ENCOUNTER — Encounter: Payer: Self-pay | Admitting: Cardiology

## 2018-03-10 VITALS — BP 124/80 | HR 60 | Ht 69.0 in | Wt 209.0 lb

## 2018-03-10 DIAGNOSIS — I10 Essential (primary) hypertension: Secondary | ICD-10-CM

## 2018-03-10 DIAGNOSIS — E782 Mixed hyperlipidemia: Secondary | ICD-10-CM | POA: Diagnosis not present

## 2018-03-10 DIAGNOSIS — I25119 Atherosclerotic heart disease of native coronary artery with unspecified angina pectoris: Secondary | ICD-10-CM | POA: Diagnosis not present

## 2018-03-10 DIAGNOSIS — E039 Hypothyroidism, unspecified: Secondary | ICD-10-CM

## 2018-03-10 NOTE — Patient Instructions (Addendum)

## 2018-04-03 ENCOUNTER — Other Ambulatory Visit (HOSPITAL_COMMUNITY)
Admission: RE | Admit: 2018-04-03 | Discharge: 2018-04-03 | Disposition: A | Discharge status: Home / Self Care | Payer: Medicare Other | Source: Other Acute Inpatient Hospital | Attending: Urology | Admitting: Urology

## 2018-04-03 ENCOUNTER — Ambulatory Visit: Payer: Medicare Other | Admitting: Urology

## 2018-04-03 DIAGNOSIS — R3915 Urgency of urination: Secondary | ICD-10-CM | POA: Diagnosis not present

## 2018-04-03 DIAGNOSIS — R3121 Asymptomatic microscopic hematuria: Secondary | ICD-10-CM

## 2018-04-03 DIAGNOSIS — R350 Frequency of micturition: Secondary | ICD-10-CM

## 2018-04-03 LAB — URINALYSIS, COMPLETE (UACMP) WITH MICROSCOPIC
BACTERIA UA: NONE SEEN
BILIRUBIN URINE: NEGATIVE
Glucose, UA: NEGATIVE mg/dL
KETONES UR: NEGATIVE mg/dL
Leukocytes,Ua: NEGATIVE
Nitrite: NEGATIVE
Protein, ur: NEGATIVE mg/dL
SPECIFIC GRAVITY, URINE: 1.008 (ref 1.005–1.030)
pH: 5 (ref 5.0–8.0)

## 2018-07-10 ENCOUNTER — Ambulatory Visit (INDEPENDENT_AMBULATORY_CARE_PROVIDER_SITE_OTHER): Payer: Medicare Other | Admitting: Urology

## 2018-07-10 DIAGNOSIS — R350 Frequency of micturition: Secondary | ICD-10-CM

## 2018-07-10 DIAGNOSIS — R3915 Urgency of urination: Secondary | ICD-10-CM | POA: Diagnosis not present

## 2018-07-23 ENCOUNTER — Ambulatory Visit (INDEPENDENT_AMBULATORY_CARE_PROVIDER_SITE_OTHER): Payer: Medicare Other | Admitting: Internal Medicine

## 2018-07-23 ENCOUNTER — Encounter (INDEPENDENT_AMBULATORY_CARE_PROVIDER_SITE_OTHER): Payer: Self-pay | Admitting: Internal Medicine

## 2018-07-23 ENCOUNTER — Other Ambulatory Visit: Payer: Self-pay

## 2018-07-23 VITALS — BP 147/70 | HR 49 | Temp 98.3°F | Ht 70.0 in | Wt 196.0 lb

## 2018-07-23 DIAGNOSIS — R634 Abnormal weight loss: Secondary | ICD-10-CM

## 2018-07-23 NOTE — Patient Instructions (Signed)
Will get records from [facility] and Dr. Manuella Ghazi.

## 2018-07-23 NOTE — Progress Notes (Signed)
Subjective:    Patient ID: Cole Mayer, male    DOB: Aug 25, 1935, 83 y.o.   MRN: [id number]  HPI Referred by Dr. Manuella Ghazi for weight loss. Has lost from in 219 was 219. Today his weight is 196. He is eating good. He could not have a BM today. The day before he had 2 BMs. He is worried about his BM. He had xrays at [facility] and will get records. If he sits on commode, and strain, he doesn't have a BM. He also has had some back pain.  His appetite has remained good.     His last colonoscopy was in December of 2016 ( hx of colonic adenomas). Prep excellent. Small polyp was cold snared from transverse colon. 2 small polyps were cold snared from splenic flexure. Small polyp removed with cold biopsy forceps from descending colon. Multiple diverticula at sigmoid colon. Hemorrhoids below the dentate line.  Patient had 4 small polyps removed and they're tubular adenomas.  Will plan next colonoscopy in 5 years if he remains in good health with first see him in the office at that time.    Hx of MI, CAD, hypertension, hypothyroidism Review of Systems Past Medical History:  Diagnosis Date  . Coronary atherosclerosis of native coronary artery    DES LAD and DES x3 RCA 2005, LVEF 60%   . Essential hypertension, benign   . Hypothyroidism   . Mixed hyperlipidemia   . Myocardial infarction ([place])    1989 (Streptokinase) and 2005    Past Surgical History:  Procedure Laterality Date  . CHOLECYSTECTOMY    . COLONOSCOPY N/A 01/18/2015   Procedure: COLONOSCOPY;  Surgeon: Rogene Houston, MD;  [facility];  Service: Endoscopy;  Laterality: N/A;  730  . CORONARY ANGIOPLASTY     Stents  . TONSILLECTOMY      No Known Allergies  Current Outpatient Medications on File Prior to Visit  Medication Sig Dispense Refill  . aspirin EC 81 MG tablet Take 81 mg by mouth daily.    Marland Kitchen levothyroxine (SYNTHROID, LEVOTHROID) 112 MCG tablet Take 50 mcg by mouth daily before breakfast.     . Multiple  Vitamin (MULTIVITAMIN) tablet Take 1 tablet by mouth daily.    . ramipril (ALTACE) 10 MG capsule Take 10 mg by mouth daily.      . simvastatin (ZOCOR) 40 MG tablet Take 40 mg by mouth daily.      . fluticasone (FLONASE) 50 MCG/ACT nasal spray Place 2 sprays into both nostrils daily.  99  . metoprolol succinate (TOPROL-XL) 50 MG 24 hr tablet Take 50 mg by mouth daily. Take with or immediately following a meal.    . nitroGLYCERIN (NITROSTAT) 0.4 MG SL tablet Place 1 tablet (0.4 mg total) under the tongue every 5 (five) minutes as needed. Up to 3 doses. 25 tablet 0  . sodium chloride (OCEAN) 0.65 % SOLN nasal spray Place 1 spray into both nostrils as needed for congestion.     No current facility-administered medications on file prior to visit.         Objective:   Physical Exam Blood pressure (!) 147/70, pulse (!) 49, temperature 98.3 F (36.8 C), height 5\' 10"  (1.778 m), weight 196 lb (88.9 kg).  Alert and oriented. Skin warm and dry. Oral mucosa is moist.   . Sclera anicteric, conjunctivae is pink. Thyroid not enlarged. No cervical lymphadenopathy. Lungs clear. Heart regular rate and rhythm.  Abdomen is soft. Bowel sounds are positive. No  hepatomegaly. No abdominal masses felt. No tenderness.  No edema to lower extremities.         Assessment & Plan:  Weight loss. Will get CT scan from [facility]. Will get labs from Dr. Manuella Ghazi. I will discuss with Dr. Laural Golden. (Dr. Laural Golden is aware).  ? Colonoscopy.

## 2018-07-28 ENCOUNTER — Telehealth (INDEPENDENT_AMBULATORY_CARE_PROVIDER_SITE_OTHER): Payer: Self-pay | Admitting: Internal Medicine

## 2018-07-28 ENCOUNTER — Other Ambulatory Visit (INDEPENDENT_AMBULATORY_CARE_PROVIDER_SITE_OTHER): Payer: Self-pay | Admitting: Internal Medicine

## 2018-07-28 DIAGNOSIS — R634 Abnormal weight loss: Secondary | ICD-10-CM

## 2018-07-28 NOTE — Telephone Encounter (Signed)
Cole Mayer, OV with Dr. Laural Golden in 6 weeks.

## 2018-07-28 NOTE — Telephone Encounter (Signed)
I spoke with wife.  sedrate and fasting cortisol. Food diary x 4 weeks. Weights OV in 6 weeks.

## 2018-07-29 ENCOUNTER — Telehealth (INDEPENDENT_AMBULATORY_CARE_PROVIDER_SITE_OTHER): Payer: Self-pay | Admitting: Internal Medicine

## 2018-07-29 NOTE — Telephone Encounter (Signed)
Please call patients spouse regarding lab work ph# [phone]

## 2018-07-29 NOTE — Telephone Encounter (Signed)
I spoke with patient. She will go to lab.

## 2018-07-31 LAB — SEDIMENTATION RATE: Sed Rate: 6 mm/h (ref 0–20)

## 2018-07-31 LAB — CORTISOL: Cortisol, Plasma: 21 ug/dL

## 2018-08-03 ENCOUNTER — Telehealth (INDEPENDENT_AMBULATORY_CARE_PROVIDER_SITE_OTHER): Payer: Self-pay | Admitting: Internal Medicine

## 2018-08-03 NOTE — Telephone Encounter (Signed)
Needs OV in 6 weeks 

## 2018-12-02 ENCOUNTER — Encounter (INDEPENDENT_AMBULATORY_CARE_PROVIDER_SITE_OTHER): Payer: Self-pay | Admitting: Nurse Practitioner

## 2018-12-02 ENCOUNTER — Ambulatory Visit (INDEPENDENT_AMBULATORY_CARE_PROVIDER_SITE_OTHER): Payer: Medicare Other | Admitting: Nurse Practitioner

## 2018-12-02 ENCOUNTER — Other Ambulatory Visit: Payer: Self-pay

## 2018-12-02 DIAGNOSIS — R634 Abnormal weight loss: Secondary | ICD-10-CM | POA: Diagnosis not present

## 2018-12-02 NOTE — Patient Instructions (Signed)
1.  We will provide Dr. Dorien Chihuahua with an update and I will call you within 1 week with his recommendations  2.  Follow-up in our office as needed

## 2018-12-02 NOTE — Progress Notes (Addendum)
Subjective:    Patient ID: GURSHAWN PICKET, male    DOB: 22-Aug-1935, 83 y.o.   MRN: [id number]  HPI Mavrick Starn is an 83 year old male with a past medical history of coronary artery disease, MI in 1989 in 2005, stent x3, hypertension and hypothyroidism and colon polyps.  He presents today accompanied by his wife for follow-up.  He is not sure why he had the appointment today.  He was last seen in our office by Deberah Castle, NP on 07/23/2018.  Previously weighed 219 pounds and was down to 196 pounds.  His weight loss was unintentional.  He weighs 198 pounds today.  He underwent a chest/abdominal/pelvic CT5/29/2020 which identified small calcified bilateral pulmonary nodules, aortic atherosclerosis and infrarenal abdominal aortic ectasia with right common iliac artery aneurysm.  No evidence of malignant process.  His appetite is good.  He is gained weight.  He denies having any upper or lower abdominal pain.  He is passing normal brown bowel movements daily.  No rectal bleeding or hematochezia.  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  His wife is present.  No other complaints today.  Colonoscopy 01/18/2015 ( hx of colonic adenomas). Prep excellent. Small TA polyp was cold snared from transverse colon. 2 small TA polyps were cold snared from splenic flexure. Small TA polyp removed with cold biopsy forceps from descending colon. Multiple diverticula at sigmoid colon. Hemorrhoids below the dentate line. Repeat colonoscopy in 5 years was recommended if appropriate at that time.   Past Medical History:  Diagnosis Date  . Coronary atherosclerosis of native coronary artery    DES LAD and DES x3 RCA 2005, LVEF 60%   . Essential hypertension, benign   . Hypothyroidism   . Mixed hyperlipidemia   . Myocardial infarction ([place])    1989 (Streptokinase) and 2005   Past Surgical History:  Procedure Laterality Date  . CHOLECYSTECTOMY    .  COLONOSCOPY N/A 01/18/2015   Procedure: COLONOSCOPY;  Surgeon: Rogene Houston, MD;  [facility];  Service: Endoscopy;  Laterality: N/A;  730  . CORONARY ANGIOPLASTY     Stents  . TONSILLECTOMY     Current Outpatient Medications on File Prior to Visit  Medication Sig Dispense Refill  . Apoaequorin (PREVAGEN PO) Take by mouth daily.    Marland Kitchen aspirin EC 81 MG tablet Take 81 mg by mouth daily.    Marland Kitchen donepezil (ARICEPT) 10 MG tablet Take 10 mg by mouth at bedtime.    . fluticasone (FLONASE) 50 MCG/ACT nasal spray Place 2 sprays into both nostrils daily.  99  . levothyroxine (SYNTHROID, LEVOTHROID) 112 MCG tablet Take 50 mcg by mouth daily before breakfast.     . metoprolol succinate (TOPROL-XL) 50 MG 24 hr tablet Take 50 mg by mouth daily. Take with or immediately following a meal.    . Multiple Vitamin (MULTIVITAMIN) tablet Take 1 tablet by mouth daily.    . nitroGLYCERIN (NITROSTAT) 0.4 MG SL tablet Place 1 tablet (0.4 mg total) under the tongue every 5 (five) minutes as needed. Up to 3 doses. 25 tablet 0  . ramipril (ALTACE) 10 MG capsule Take 10 mg by mouth daily.      . simvastatin (ZOCOR) 40 MG tablet Take 40 mg by mouth daily.       No current facility-administered medications on file prior to visit.    No Known Allergies  Review of Systems see  HPI, all other systems reviewed and are negative     Objective:   Physical Exam BP (!) 175/74   Pulse 66   Temp 97.8 F (36.6 C) (Oral)   Ht 5\' 10"  (1.778 m)   Wt 198 lb 12.8 oz (90.2 kg)   BMI 28.52 kg/m   General: 83 year old male alert well-developed in no acute distress. Head: Healing scalp wound Eyes: Sclera nonicteric, conjunctiva pink Mouth: Dentition intact, no ulcers or lesions Neck: Supple, no lymphadenopathy or thyromegaly Heart: Regular rate and rhythm, no murmurs Lungs: Breath sounds clear throughout Abdomen: Soft, nondistended, large central mid abdominal horizontal scar intact, the mid aorta is slightly  enlarged without bruit, positive bowel sounds to all 4 quadrants, no HSM Extremities: Compression stockings in use, no significant edema appreciated Neuro: Alert and oriented x3, speech is clear, he moves all 4 extremities equally    Assessment & Plan:   47.  83 year old male with unintentional weight loss which is stabilized -Patient to follow-up in the office in 3 to 4 months for weight recheck -Advised the patient to follow-up with his primary care physician regarding the tiny bilateral pulmonary calcified nodules and the infrarenal abdominal aortic ectasia with right common iliac artery aneurysm seen on CT 06/19/2018. Patient will call our office if he notices more weight loss -Further recommendations per Dr. Laural Golden  2.  History of colon polyps -Next colonoscopy due December 2021 if appropriate at that time

## 2019-03-18 ENCOUNTER — Ambulatory Visit: Payer: Medicare PPO | Admitting: Cardiology

## 2019-04-05 ENCOUNTER — Encounter: Payer: Self-pay | Admitting: *Deleted

## 2019-04-05 ENCOUNTER — Encounter: Payer: Self-pay | Admitting: Cardiology

## 2019-04-05 ENCOUNTER — Ambulatory Visit: Payer: Medicare PPO | Admitting: Cardiology

## 2019-04-05 ENCOUNTER — Other Ambulatory Visit: Payer: Self-pay

## 2019-04-05 VITALS — BP 140/80 | HR 60 | Ht 70.0 in | Wt 200.0 lb

## 2019-04-05 DIAGNOSIS — I10 Essential (primary) hypertension: Secondary | ICD-10-CM

## 2019-04-05 DIAGNOSIS — I25119 Atherosclerotic heart disease of native coronary artery with unspecified angina pectoris: Secondary | ICD-10-CM

## 2019-04-05 DIAGNOSIS — E782 Mixed hyperlipidemia: Secondary | ICD-10-CM

## 2019-04-05 NOTE — Progress Notes (Signed)
Cardiology Office Note  Date: 04/05/2019   ID: Keishaun, Ludden 1935/10/12, MRN [id number]  PCP:  Monico Blitz, MD  Cardiologist:  Rozann Lesches, MD Electrophysiologist:  None   Chief Complaint  Patient presents with  . Cardiac follow-up    History of Present Illness: Cole Mayer is an 84 y.o. male last seen in February 2020.  He presents for a routine follow-up visit.  He tells me that he does not experience any significant angina symptoms, has not used any nitroglycerin tablets.  It is evident in talking with him that he has had trouble with his memory, I see that he is now on Aricept.  Despite this, it sounds like he stays reasonably active, enjoys doing chores around the house and outdoors.  Last ischemic testing was in 2019 as outlined below, overall low risk.  His current cardiac regimen includes aspirin, Toprol-XL, Altace, Zocor, and as needed nitroglycerin.  We are requesting his most recent lab work from Dr. Manuella Ghazi.  He uses compression stockings for lower extremity edema and venous stasis.  I personally reviewed his ECG today which shows sinus rhythm with prolonged PR interval, right bundle branch block, lead motion artifact.  He states that he and his wife have gotten both doses of the coronavirus vaccine.  Past Medical History:  Diagnosis Date  . Coronary atherosclerosis of native coronary artery    DES LAD and DES x3 RCA 2005, LVEF 60%   . Essential hypertension   . Hypothyroidism   . Mixed hyperlipidemia   . Myocardial infarction ([place])    1989 (Streptokinase) and 2005    Past Surgical History:  Procedure Laterality Date  . CHOLECYSTECTOMY    . COLONOSCOPY N/A 01/18/2015   Procedure: COLONOSCOPY;  Surgeon: Rogene Houston, MD;  [facility];  Service: Endoscopy;  Laterality: N/A;  730  . CORONARY ANGIOPLASTY     Stents  . TONSILLECTOMY      Current Outpatient Medications  Medication Sig Dispense Refill  . aspirin EC 81 MG tablet Take  81 mg by mouth daily.    Marland Kitchen donepezil (ARICEPT) 10 MG tablet Take 10 mg by mouth at bedtime.    . fluticasone (FLONASE) 50 MCG/ACT nasal spray Place 2 sprays into both nostrils daily.  99  . levothyroxine (SYNTHROID, LEVOTHROID) 112 MCG tablet Take 112 mcg by mouth daily before breakfast.     . MEMANTINE HCL ER PO Take 14 mg by mouth daily.    . metoprolol succinate (TOPROL-XL) 50 MG 24 hr tablet Take 50 mg by mouth daily. Take with or immediately following a meal.    . Multiple Vitamin (MULTIVITAMIN) tablet Take 1 tablet by mouth daily.    . nitroGLYCERIN (NITROSTAT) 0.4 MG SL tablet Place 1 tablet (0.4 mg total) under the tongue every 5 (five) minutes as needed. Up to 3 doses. 25 tablet 0  . ramipril (ALTACE) 10 MG capsule Take 10 mg by mouth daily.      . simvastatin (ZOCOR) 40 MG tablet Take 40 mg by mouth daily.       No current facility-administered medications for this visit.   Allergies:  Patient has no known allergies.   ROS:   Memory loss.   Physical Exam: VS:  BP 140/80   Pulse 60   Ht 5\' 10"  (1.778 m)   Wt 200 lb (90.7 kg)   SpO2 98%   BMI 28.70 kg/m , BMI Body mass index is 28.7 kg/m.  Wt  Readings from Last 3 Encounters:  04/05/19 200 lb (90.7 kg)  12/02/18 198 lb 12.8 oz (90.2 kg)  07/23/18 196 lb (88.9 kg)    General: Elderly male, appears comfortable at rest. HEENT: Conjunctiva and lids normal, wearing a mask. Neck: Supple, no elevated JVP or carotid bruits, no thyromegaly. Lungs: Clear to auscultation, nonlabored breathing at rest. Cardiac: Regular rate and rhythm with ectopy, distant heart sounds, no significant systolic murmur. Abdomen: Soft, nontender, bowel sounds present. Extremities: Bilateral leg edema/venous stasis, distal pulses 2+.  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  Recent Labwork:  February 2020: TSH 1.46,  cholesterol 141, triglycerides 107, HDL 48, LDL 72, hemoglobin 13.4, platelets 242, BUN 19, creatinine 1.33, potassium 4.4, AST 27, ALT 25  Other Studies Reviewed Today:  Lexiscan Myoview 03/18/2017:  T wave inversion was noted during stress in the V3, V4 and III leads.  Defect 1: There is a medium defect of moderate severity present in the mid inferior, mid inferolateral, apical inferior and apical lateral location.  Findings may be consistent with prior myocardial infarction with a very mild degree of peri-infarct ischemia. However, variable soft tissue attenuation is also likely contributing to this defect.  Nuclear stress EF: 64%.  Overall, this is a low risk study.  Assessment and Plan:  1.  CAD status post DES interventions to the LAD and RCA in 2005.  He remains clinically stable without active angina on medical therapy.  Ischemic testing from 2019 was low risk as outlined above.  We continue with observation in the absence of progressive symptoms.  Continue aspirin, Toprol-XL, Altace, and Zocor.  2.  Mixed hyperlipidemia, continue Zocor.  Requesting recent lab work from Dr. Manuella Ghazi.  3.  Essential hypertension, systolic blood pressure [id number] today.  No changes made to present medications including Toprol-XL and Altace.  Medication Adjustments/Labs and Tests Ordered: Current medicines are reviewed at length with the patient today.  Concerns regarding medicines are outlined above.   Tests Ordered: Orders Placed This Encounter  Procedures  . EKG 12-Lead    Medication Changes: No orders of the defined types were placed in this encounter.   Disposition:  Follow up 1 year in the [place] office.  Signed, Satira Sark, MD, [facility] 04/05/2019 4:46 PM    [facility] at [place], [place], [place] [address] Phone: [phone]; Fax: [phone]

## 2019-04-05 NOTE — Patient Instructions (Addendum)

## 2019-06-03 ENCOUNTER — Ambulatory Visit: Payer: Medicare PPO | Admitting: Cardiology

## 2019-06-10 DIAGNOSIS — J310 Chronic rhinitis: Secondary | ICD-10-CM | POA: Diagnosis not present

## 2019-06-10 DIAGNOSIS — J343 Hypertrophy of nasal turbinates: Secondary | ICD-10-CM | POA: Diagnosis not present

## 2019-06-10 DIAGNOSIS — H6121 Impacted cerumen, right ear: Secondary | ICD-10-CM | POA: Diagnosis not present

## 2019-06-10 DIAGNOSIS — H903 Sensorineural hearing loss, bilateral: Secondary | ICD-10-CM | POA: Diagnosis not present

## 2019-06-10 DIAGNOSIS — H9313 Tinnitus, bilateral: Secondary | ICD-10-CM | POA: Diagnosis not present

## 2019-07-15 ENCOUNTER — Other Ambulatory Visit: Payer: Self-pay | Admitting: *Deleted

## 2019-07-15 MED ORDER — NITROGLYCERIN 0.4 MG SL SUBL: 0.4000 mg | SUBLINGUAL_TABLET | SUBLINGUAL | 3 refills | Status: DC | PRN

## 2019-07-29 DIAGNOSIS — L570 Actinic keratosis: Secondary | ICD-10-CM | POA: Diagnosis not present

## 2019-07-29 DIAGNOSIS — X32XXXD Exposure to sunlight, subsequent encounter: Secondary | ICD-10-CM | POA: Diagnosis not present

## 2019-08-31 DIAGNOSIS — Z299 Encounter for prophylactic measures, unspecified: Secondary | ICD-10-CM | POA: Diagnosis not present

## 2019-08-31 DIAGNOSIS — F0390 Unspecified dementia without behavioral disturbance: Secondary | ICD-10-CM | POA: Diagnosis not present

## 2019-08-31 DIAGNOSIS — I10 Essential (primary) hypertension: Secondary | ICD-10-CM | POA: Diagnosis not present

## 2019-08-31 DIAGNOSIS — Z87891 Personal history of nicotine dependence: Secondary | ICD-10-CM | POA: Diagnosis not present

## 2019-08-31 DIAGNOSIS — N183 Chronic kidney disease, stage 3 unspecified: Secondary | ICD-10-CM | POA: Diagnosis not present

## 2019-08-31 DIAGNOSIS — N2581 Secondary hyperparathyroidism of renal origin: Secondary | ICD-10-CM | POA: Diagnosis not present

## 2019-08-31 DIAGNOSIS — I25119 Atherosclerotic heart disease of native coronary artery with unspecified angina pectoris: Secondary | ICD-10-CM | POA: Diagnosis not present

## 2019-09-14 DIAGNOSIS — Z87891 Personal history of nicotine dependence: Secondary | ICD-10-CM | POA: Diagnosis not present

## 2019-09-14 DIAGNOSIS — I872 Venous insufficiency (chronic) (peripheral): Secondary | ICD-10-CM | POA: Diagnosis not present

## 2019-09-14 DIAGNOSIS — Z299 Encounter for prophylactic measures, unspecified: Secondary | ICD-10-CM | POA: Diagnosis not present

## 2019-09-14 DIAGNOSIS — R600 Localized edema: Secondary | ICD-10-CM | POA: Diagnosis not present

## 2019-09-14 DIAGNOSIS — I10 Essential (primary) hypertension: Secondary | ICD-10-CM | POA: Diagnosis not present

## 2019-09-14 DIAGNOSIS — F0390 Unspecified dementia without behavioral disturbance: Secondary | ICD-10-CM | POA: Diagnosis not present

## 2019-09-17 DIAGNOSIS — F0390 Unspecified dementia without behavioral disturbance: Secondary | ICD-10-CM | POA: Diagnosis not present

## 2019-09-17 DIAGNOSIS — Z299 Encounter for prophylactic measures, unspecified: Secondary | ICD-10-CM | POA: Diagnosis not present

## 2019-09-17 DIAGNOSIS — I739 Peripheral vascular disease, unspecified: Secondary | ICD-10-CM | POA: Diagnosis not present

## 2019-09-17 DIAGNOSIS — I10 Essential (primary) hypertension: Secondary | ICD-10-CM | POA: Diagnosis not present

## 2019-09-17 DIAGNOSIS — I872 Venous insufficiency (chronic) (peripheral): Secondary | ICD-10-CM | POA: Diagnosis not present

## 2019-09-17 DIAGNOSIS — N183 Chronic kidney disease, stage 3 unspecified: Secondary | ICD-10-CM | POA: Diagnosis not present

## 2019-09-23 DIAGNOSIS — L570 Actinic keratosis: Secondary | ICD-10-CM | POA: Diagnosis not present

## 2019-09-23 DIAGNOSIS — X32XXXD Exposure to sunlight, subsequent encounter: Secondary | ICD-10-CM | POA: Diagnosis not present

## 2019-09-23 DIAGNOSIS — C4442 Squamous cell carcinoma of skin of scalp and neck: Secondary | ICD-10-CM | POA: Diagnosis not present

## 2019-10-01 DIAGNOSIS — N183 Chronic kidney disease, stage 3 unspecified: Secondary | ICD-10-CM | POA: Diagnosis not present

## 2019-10-01 DIAGNOSIS — Z299 Encounter for prophylactic measures, unspecified: Secondary | ICD-10-CM | POA: Diagnosis not present

## 2019-10-01 DIAGNOSIS — F0390 Unspecified dementia without behavioral disturbance: Secondary | ICD-10-CM | POA: Diagnosis not present

## 2019-10-01 DIAGNOSIS — I10 Essential (primary) hypertension: Secondary | ICD-10-CM | POA: Diagnosis not present

## 2019-10-01 DIAGNOSIS — I872 Venous insufficiency (chronic) (peripheral): Secondary | ICD-10-CM | POA: Diagnosis not present

## 2019-10-01 DIAGNOSIS — I25119 Atherosclerotic heart disease of native coronary artery with unspecified angina pectoris: Secondary | ICD-10-CM | POA: Diagnosis not present

## 2019-10-14 DIAGNOSIS — I10 Essential (primary) hypertension: Secondary | ICD-10-CM | POA: Diagnosis not present

## 2019-10-14 DIAGNOSIS — Z299 Encounter for prophylactic measures, unspecified: Secondary | ICD-10-CM | POA: Diagnosis not present

## 2019-10-14 DIAGNOSIS — N2581 Secondary hyperparathyroidism of renal origin: Secondary | ICD-10-CM | POA: Diagnosis not present

## 2019-10-14 DIAGNOSIS — M549 Dorsalgia, unspecified: Secondary | ICD-10-CM | POA: Diagnosis not present

## 2019-10-14 DIAGNOSIS — R350 Frequency of micturition: Secondary | ICD-10-CM | POA: Diagnosis not present

## 2019-10-14 DIAGNOSIS — I25119 Atherosclerotic heart disease of native coronary artery with unspecified angina pectoris: Secondary | ICD-10-CM | POA: Diagnosis not present

## 2019-11-02 DIAGNOSIS — J310 Chronic rhinitis: Secondary | ICD-10-CM | POA: Diagnosis not present

## 2019-11-02 DIAGNOSIS — H9313 Tinnitus, bilateral: Secondary | ICD-10-CM | POA: Diagnosis not present

## 2019-11-02 DIAGNOSIS — J342 Deviated nasal septum: Secondary | ICD-10-CM | POA: Diagnosis not present

## 2019-11-02 DIAGNOSIS — R0982 Postnasal drip: Secondary | ICD-10-CM | POA: Diagnosis not present

## 2019-11-02 DIAGNOSIS — J343 Hypertrophy of nasal turbinates: Secondary | ICD-10-CM | POA: Diagnosis not present

## 2019-11-04 DIAGNOSIS — X32XXXD Exposure to sunlight, subsequent encounter: Secondary | ICD-10-CM | POA: Diagnosis not present

## 2019-11-04 DIAGNOSIS — L570 Actinic keratosis: Secondary | ICD-10-CM | POA: Diagnosis not present

## 2019-11-04 DIAGNOSIS — L820 Inflamed seborrheic keratosis: Secondary | ICD-10-CM | POA: Diagnosis not present

## 2019-11-04 DIAGNOSIS — Z08 Encounter for follow-up examination after completed treatment for malignant neoplasm: Secondary | ICD-10-CM | POA: Diagnosis not present

## 2019-11-04 DIAGNOSIS — L298 Other pruritus: Secondary | ICD-10-CM | POA: Diagnosis not present

## 2019-11-04 DIAGNOSIS — Z85828 Personal history of other malignant neoplasm of skin: Secondary | ICD-10-CM | POA: Diagnosis not present

## 2019-12-01 DIAGNOSIS — F0390 Unspecified dementia without behavioral disturbance: Secondary | ICD-10-CM | POA: Diagnosis not present

## 2019-12-01 DIAGNOSIS — N2581 Secondary hyperparathyroidism of renal origin: Secondary | ICD-10-CM | POA: Diagnosis not present

## 2019-12-01 DIAGNOSIS — Z6829 Body mass index (BMI) 29.0-29.9, adult: Secondary | ICD-10-CM | POA: Diagnosis not present

## 2019-12-01 DIAGNOSIS — I10 Essential (primary) hypertension: Secondary | ICD-10-CM | POA: Diagnosis not present

## 2019-12-01 DIAGNOSIS — L03119 Cellulitis of unspecified part of limb: Secondary | ICD-10-CM | POA: Diagnosis not present

## 2019-12-01 DIAGNOSIS — Z299 Encounter for prophylactic measures, unspecified: Secondary | ICD-10-CM | POA: Diagnosis not present

## 2019-12-10 DIAGNOSIS — R739 Hyperglycemia, unspecified: Secondary | ICD-10-CM | POA: Diagnosis not present

## 2019-12-10 DIAGNOSIS — Z6829 Body mass index (BMI) 29.0-29.9, adult: Secondary | ICD-10-CM | POA: Diagnosis not present

## 2019-12-10 DIAGNOSIS — I10 Essential (primary) hypertension: Secondary | ICD-10-CM | POA: Diagnosis not present

## 2019-12-10 DIAGNOSIS — I8390 Asymptomatic varicose veins of unspecified lower extremity: Secondary | ICD-10-CM | POA: Diagnosis not present

## 2019-12-10 DIAGNOSIS — Z299 Encounter for prophylactic measures, unspecified: Secondary | ICD-10-CM | POA: Diagnosis not present

## 2019-12-14 DIAGNOSIS — N183 Chronic kidney disease, stage 3 unspecified: Secondary | ICD-10-CM | POA: Diagnosis not present

## 2019-12-14 DIAGNOSIS — N1330 Unspecified hydronephrosis: Secondary | ICD-10-CM | POA: Diagnosis not present

## 2020-01-04 ENCOUNTER — Other Ambulatory Visit: Payer: Self-pay

## 2020-01-05 ENCOUNTER — Other Ambulatory Visit: Payer: Self-pay | Admitting: Urology

## 2020-01-05 DIAGNOSIS — N1330 Unspecified hydronephrosis: Secondary | ICD-10-CM

## 2020-01-24 ENCOUNTER — Ambulatory Visit (HOSPITAL_COMMUNITY)
Admission: RE | Admit: 2020-01-24 | Discharge: 2020-01-24 | Disposition: A | Discharge status: Home / Self Care | Payer: Medicare PPO | Source: Ambulatory Visit | Attending: Urology | Admitting: Urology

## 2020-01-24 DIAGNOSIS — I700 Atherosclerosis of aorta: Secondary | ICD-10-CM | POA: Diagnosis not present

## 2020-01-24 DIAGNOSIS — N1330 Unspecified hydronephrosis: Secondary | ICD-10-CM | POA: Diagnosis not present

## 2020-01-24 DIAGNOSIS — N200 Calculus of kidney: Secondary | ICD-10-CM | POA: Diagnosis not present

## 2020-01-24 DIAGNOSIS — N5089 Other specified disorders of the male genital organs: Secondary | ICD-10-CM | POA: Diagnosis not present

## 2020-01-25 ENCOUNTER — Other Ambulatory Visit: Payer: Self-pay | Admitting: Urology

## 2020-01-25 ENCOUNTER — Telehealth: Payer: Self-pay

## 2020-01-25 DIAGNOSIS — N432 Other hydrocele: Secondary | ICD-10-CM

## 2020-01-25 NOTE — Telephone Encounter (Signed)
Pt notified of message form Dr. Annabell Howells and test to be scheduled.

## 2020-01-25 NOTE — Progress Notes (Signed)
The hydronephrosis noted is felt to be consistent with a mild partial UPJ obstruction that was similar to the CT on 06/19/18.  There is some nodularity a left scrotal hydrocele and a scrotal US was recommended.  He is seeing me on 1/6 so if we could get a scrotal US prior to then either at [facility] or [facility] that would be helpful.

## 2020-01-25 NOTE — Telephone Encounter (Signed)
-----   Message from Bjorn Pippin, MD sent at 01/25/2020 10:25 AM EST ----- The hydronephrosis noted is felt to be consistent with a mild partial UPJ obstruction that was similar to the CT on 06/19/18.  There is some nodularity a left scrotal hydrocele and a scrotal US was recommended.  He is seeing me on 1/6 so if we could get a scrotal US prior to then either at [facility] or [facility] that would be helpful.

## 2020-01-25 NOTE — Progress Notes (Signed)
French Ana notified of Scrotal US wanted done by Dr. Annabell Howells.

## 2020-01-27 ENCOUNTER — Other Ambulatory Visit: Payer: Self-pay

## 2020-01-27 ENCOUNTER — Ambulatory Visit: Payer: Medicare PPO | Admitting: Urology

## 2020-01-27 ENCOUNTER — Ambulatory Visit (HOSPITAL_COMMUNITY)
Admission: RE | Admit: 2020-01-27 | Discharge: 2020-01-27 | Disposition: A | Discharge status: Home / Self Care | Payer: Medicare PPO | Source: Ambulatory Visit | Attending: Urology | Admitting: Urology

## 2020-01-27 ENCOUNTER — Encounter: Payer: Self-pay | Admitting: Urology

## 2020-01-27 ENCOUNTER — Ambulatory Visit (INDEPENDENT_AMBULATORY_CARE_PROVIDER_SITE_OTHER): Payer: Medicare PPO | Admitting: Urology

## 2020-01-27 VITALS — BP 184/96 | HR 64 | Temp 98.7°F | Ht 66.0 in | Wt 206.0 lb

## 2020-01-27 DIAGNOSIS — N503 Cyst of epididymis: Secondary | ICD-10-CM | POA: Diagnosis not present

## 2020-01-27 DIAGNOSIS — N433 Hydrocele, unspecified: Secondary | ICD-10-CM | POA: Diagnosis not present

## 2020-01-27 DIAGNOSIS — I861 Scrotal varices: Secondary | ICD-10-CM | POA: Diagnosis not present

## 2020-01-27 DIAGNOSIS — Q6239 Other obstructive defects of renal pelvis and ureter: Secondary | ICD-10-CM | POA: Diagnosis not present

## 2020-01-27 DIAGNOSIS — N430 Encysted hydrocele: Secondary | ICD-10-CM

## 2020-01-27 DIAGNOSIS — N432 Other hydrocele: Secondary | ICD-10-CM | POA: Insufficient documentation

## 2020-01-27 DIAGNOSIS — N1330 Unspecified hydronephrosis: Secondary | ICD-10-CM

## 2020-01-27 NOTE — Progress Notes (Signed)
Subjective:  1. Epididymal cyst   2. Encysted hydrocele   3. UPJ obstruction, congenital      Cole Mayer is sent back in consultation for the finding of mild left hydronephrosis on recent CT imaging.  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   More worrisome was some nodularity within a left hydrocele which was partially visualized.   He is having some low back pain but no scrotal or flank pain.  He is voiding ok with an IPSS of 5.   He had a scrotal US today that showed some right testicular atrophy and a 3.4cm septated cyst of the head of the epididymis and a small hydrocele.     IPSS    Row Name 01/27/20 1400         International Prostate Symptom Score   How often have you had the sensation of not emptying your bladder? Not at All     How often have you had to urinate less than every two hours? Less than half the time     How often have you found you stopped and started again several times when you urinated? Not at All     How often have you found it difficult to postpone urination? Not at All     How often have you had a weak urinary stream? Less than 1 in 5 times     How often have you had to strain to start urination? Not at All     How many times did you typically get up at night to urinate? 2 Times     Total IPSS Score 5           Quality of Life due to urinary symptoms   If you were to spend the rest of your life with your urinary condition just the way it is now how would you feel about that? Mixed             ROS:  ROS:  A complete review of systems was performed.  All systems are negative except for pertinent findings as noted.   ROS  No Known Allergies  Outpatient Encounter Medications as of 01/27/2020  Medication Sig  . aspirin EC 81 MG tablet Take 81 mg by mouth daily.  Marland Kitchen donepezil (ARICEPT) 10 MG tablet Take 10 mg by mouth at bedtime.  . fluticasone (FLONASE) 50  MCG/ACT nasal spray Place 2 sprays into both nostrils daily.  Marland Kitchen ipratropium (ATROVENT) 0.06 % nasal spray   . levothyroxine (SYNTHROID, LEVOTHROID) 112 MCG tablet Take 112 mcg by mouth daily before breakfast.   . MEMANTINE HCL ER PO Take 14 mg by mouth daily.  . metoprolol succinate (TOPROL-XL) 50 MG 24 hr tablet Take 50 mg by mouth daily. Take with or immediately following a meal.  . Multiple Vitamin (MULTIVITAMIN) tablet Take 1 tablet by mouth daily.  . nitroGLYCERIN (NITROSTAT) 0.4 MG SL tablet Place 1 tablet (0.4 mg total) under the tongue every 5 (five) minutes as needed. Up to 3 doses.  . ramipril (ALTACE) 10 MG capsule Take 10 mg by mouth daily.  . simvastatin (ZOCOR) 40 MG tablet Take 40 mg by mouth daily.  Marland Kitchen triamcinolone (KENALOG) 0.1 %    No facility-administered encounter medications on file as of 01/27/2020.    Past Medical History:  Diagnosis Date  . Coronary atherosclerosis of native coronary artery  DES LAD and DES x3 RCA 2005, LVEF 60%   . Essential hypertension   . Hypothyroidism   . Mixed hyperlipidemia   . Myocardial infarction (HCC)    1989 (Streptokinase) and 2005    Past Surgical History:  Procedure Laterality Date  . CHOLECYSTECTOMY    . COLONOSCOPY N/A 01/18/2015   Procedure: COLONOSCOPY;  Surgeon: Malissa Hippo, MD;  [facility]: [facility];  Service: Endoscopy;  Laterality: N/A;  730  . CORONARY ANGIOPLASTY     Stents  . TONSILLECTOMY      Social History   Socioeconomic History  . Marital status: Married    Spouse name: Not on file  . Number of children: Not on file  . Years of education: Not on file  . Highest education level: Not on file  Occupational History  . Occupation: Runner, broadcasting/film/video at [employer]    Comment: Retired  Tobacco Use  . Smoking status: Former Smoker    Packs/day: 0.80    Years: 30.00    Pack years: 24.00    Types: Cigarettes    Start date: 09/08/1957    Quit date: 01/05/1988    Years since quitting: 32.0  .  Smokeless tobacco: Former Neurosurgeon    Types: Chew  . Tobacco comment: chewed a little  Vaping Use  . Vaping Use: Never used  Substance and Sexual Activity  . Alcohol use: Yes    Comment: Occasional  . Drug use: No  . Sexual activity: Not on file  Other Topics Concern  . Not on file  Social History Narrative  . Not on file   Social Determinants of Health   Financial Resource Strain: Not on file  Food Insecurity: Not on file  Transportation Needs: Not on file  Physical Activity: Not on file  Stress: Not on file  Social Connections: Not on file  Intimate Partner Violence: Not on file    Family History  Problem Relation Age of Onset  . Coronary artery disease Other   . Thyroid disease Father   . Hypertension Father        Objective: Vitals:   01/27/20 1428  BP: (!) 184/96  Pulse: 64  Temp: 98.7 F (37.1 C)     Physical Exam Vitals reviewed.  Constitutional:      Appearance: Normal appearance.  Abdominal:     General: Abdomen is flat.     Palpations: Abdomen is soft.     Hernia: No hernia is present.  Genitourinary:    Comments: Nl phallus with adequate meatus. Scrotum without lesions. Right testicle mildly atrophic without mass. Left testicle normal but with a cystic mass superior to the testicle.   Neurological:     Mental Status: He is alert.     Lab Results:  PSA No results found for: PSA No results found for: TESTOSTERONE  UA is unremarkable today.  Studies/Results: US SCROTUM W/DOPPLER  Result Date: 01/27/2020 CLINICAL DATA:  Hydrocele EXAM: SCROTAL ULTRASOUND DOPPLER ULTRASOUND OF THE TESTICLES TECHNIQUE: Complete ultrasound examination of the testicles, epididymis, and other scrotal structures was performed. Color and spectral Doppler ultrasound were also utilized to evaluate blood flow to the testicles. COMPARISON:  None. FINDINGS: Right testicle Measurements: 4.3 x 2.8 x 2.4 cm. The right testicle is heterogeneous in appearance and somewhat  hypoechoic when compared to the right. There is no testicular mass. Left testicle Measurements: 4.7 x 2.9 x 3.6 cm. No mass or microlithiasis visualized. Right epididymis:  There is a 6 mm right-sided  epididymal head cyst. Left epididymis: There is a septated epididymal head cyst. This measures approximately 3.4 x 3.2 cm. Hydrocele:  There is a small left-sided hydrocele. Varicocele:  None visualized. Pulsed Doppler interrogation of both testes demonstrates demonstrates normal arterial waveforms bilaterally. However, clear venous waveforms were difficult to obtain bilaterally. There is asymmetry of the scrotal wall with the scrotal wall thickness greatest on the right. IMPRESSION: 1. No definite evidence for testicular torsion.  No testicular mass. 2. There is a small left-sided hydrocele in addition to a septated left-sided epididymal head cyst. 3. By the right testicle is hypoechoic when compared to the left. This is of unknown clinical significance and may be secondary to a prior infectious or inflammatory insult. Electronically Signed   By: Constance Holster M.D.   On: 01/27/2020 14:27   No results found for this or any previous visit.  No results found for this or any previous visit.  No results found for this or any previous visit.  No results found for this or any previous visit.  No results found for this or any previous visit.  No results found for this or any previous visit.  No results found for this or any previous visit.  Results for orders placed during the hospital encounter of 01/24/20  CT RENAL STONE STUDY  Narrative CLINICAL DATA:  Hydronephrosis.  EXAM: CT ABDOMEN AND PELVIS WITHOUT CONTRAST  TECHNIQUE: Multidetector CT imaging of the abdomen and pelvis was performed following the standard protocol without IV contrast.  COMPARISON:  Ultrasound exam 12/14/2019.  CT scan 06/19/2018.  FINDINGS: Lower chest: Fine detail of lung parenchyma in the lower lungs  is obscured by breathing motion. Calcified granuloma noted posterior right lower lobe.  Hepatobiliary: No focal abnormality in the liver on this study without intravenous contrast. Nonvisualization of the gallbladder suggest cholecystectomy. No intrahepatic or extrahepatic biliary dilation.  Pancreas: No focal mass lesion. No dilatation of the main duct. No intraparenchymal cyst. No peripancreatic edema.  Spleen: Small exophytic process medial spleen is stable, consistent with benign etiology.  Adrenals/Urinary Tract: No adrenal nodule or mass. No stones are seen in either kidney. No caliectasis on the right with mild prominence of the right renal pelvis. No associated right hydroureter.  Minimal fullness noted left intrarenal collecting system and renal pelvis, similar to prior study. Coronal imaging shows a punctate 2 mm nonobstructing stone in the upper pole the left kidney in relatively abrupt transition from the distended renal pelvis to the ureter (see coronal 55/5) suggests component of UPJ obstruction. No left hydroureter or evidence of stone in the left ureter.  The urinary bladder appears normal for the degree of distention.  Stomach/Bowel: Stomach is unremarkable. No gastric wall thickening. No evidence of outlet obstruction. Duodenum is normally positioned as is the ligament of Treitz. Duodenal diverticulum noted. No small bowel wall thickening. No small bowel dilatation. The terminal ileum is normal. The appendix is normal. No gross colonic mass. No colonic wall thickening. Diverticular changes are noted in the left colon without evidence of diverticulitis.  Vascular/Lymphatic: There is abdominal aortic atherosclerosis. Infrarenal abdominal aorta measures up to 3.9 cm diameter. There is no gastrohepatic or hepatoduodenal ligament lymphadenopathy. No retroperitoneal or mesenteric lymphadenopathy. No pelvic sidewall lymphadenopathy.  Reproductive: Dystrophic  calcification noted in the prostate gland. Abnormal appearance of the upper left hemiscrotum with apparent peripheral nodular soft tissue (see axial 96/2).  Other: No intraperitoneal free fluid.  Musculoskeletal: No worrisome lytic or sclerotic osseous abnormality.  IMPRESSION: 1. Minimal  fullness of the left intrarenal collecting system and renal pelvis with relatively abrupt transition to collapsed left ureter. Imaging features suggest component of UPJ obstruction. Imaging features similar to CT scan of 06/19/2018. 2. No right hydronephrosis or hydroureter. 3. 2 mm nonobstructing stone upper pole left kidney. 4. Abnormal appearance of the upper left hemiscrotum with apparent peripheral nodular soft tissue. This is incompletely visualized on today's exam. Dedicated scrotal ultrasound recommended to further evaluate. 5. Aortic Atherosclerosis (ICD10-I70.0).  These results will be called to the ordering clinician or representative by the Radiologist Assistant, and communication documented in the PACS or [employer].   Electronically Signed By: Misty Stanley M.D. On: 01/25/2020 08:34    Assessment & Plan: 1. Left UPJ obstruction.  Mild, asymptomatic and stable over the last 8 months.  2. Left epdidymal cyst.  The cyst is complex.  I will have him return in 3 months with a repeat US to assess stability.    No orders of the defined types were placed in this encounter.    Orders Placed This Encounter  Procedures  . US SCROTUM W/DOPPLER    Standing Status:   Future    Standing Expiration Date:   05/26/2020    Order Specific Question:   Reason for Exam (SYMPTOM  OR DIAGNOSIS REQUIRED)    Answer:   epididymal cyst f/u    Order Specific Question:   Preferred imaging location?    Answer:   [facility]      Return in about 3 months (around 04/26/2020) for with repeat scrotal US..   CC: Monico Blitz, MD      Irine Seal 01/27/2020

## 2020-01-27 NOTE — Progress Notes (Signed)
Urological Symptom Review  Patient is experiencing the following symptoms: Frequent urination  Getting up at night to urinate Review of Systems  Gastrointestinal (upper)  : Negative for upper GI symptoms  Gastrointestinal (lower) : Negative for lower GI symptoms  Constitutional : Negative for symptoms  Skin: Negative for skin symptoms  Eyes: Negative for eye symptoms  Ear/Nose/Throat : Sinus problems  Hematologic/Lymphatic: Negative for Hematologic/Lymphatic symptoms  Cardiovascular : Leg swelling  Respiratory : Negative for respiratory symptoms  Endocrine: Negative for endocrine symptoms  Musculoskeletal: Back pain  Neurological: Negative for neurological symptoms  Psychologic: Negative for psychiatric symptoms

## 2020-03-03 DIAGNOSIS — Z125 Encounter for screening for malignant neoplasm of prostate: Secondary | ICD-10-CM | POA: Diagnosis not present

## 2020-03-03 DIAGNOSIS — Z7189 Other specified counseling: Secondary | ICD-10-CM | POA: Diagnosis not present

## 2020-03-03 DIAGNOSIS — Z299 Encounter for prophylactic measures, unspecified: Secondary | ICD-10-CM | POA: Diagnosis not present

## 2020-03-03 DIAGNOSIS — E7800 Pure hypercholesterolemia, unspecified: Secondary | ICD-10-CM | POA: Diagnosis not present

## 2020-03-03 DIAGNOSIS — Z79899 Other long term (current) drug therapy: Secondary | ICD-10-CM | POA: Diagnosis not present

## 2020-03-03 DIAGNOSIS — Z1331 Encounter for screening for depression: Secondary | ICD-10-CM | POA: Diagnosis not present

## 2020-03-03 DIAGNOSIS — R5383 Other fatigue: Secondary | ICD-10-CM | POA: Diagnosis not present

## 2020-03-03 DIAGNOSIS — E669 Obesity, unspecified: Secondary | ICD-10-CM | POA: Diagnosis not present

## 2020-03-03 DIAGNOSIS — Z0000 Encounter for general adult medical examination without abnormal findings: Secondary | ICD-10-CM | POA: Diagnosis not present

## 2020-03-03 DIAGNOSIS — I10 Essential (primary) hypertension: Secondary | ICD-10-CM | POA: Diagnosis not present

## 2020-03-03 DIAGNOSIS — E039 Hypothyroidism, unspecified: Secondary | ICD-10-CM | POA: Diagnosis not present

## 2020-03-03 DIAGNOSIS — Z6831 Body mass index (BMI) 31.0-31.9, adult: Secondary | ICD-10-CM | POA: Diagnosis not present

## 2020-03-03 DIAGNOSIS — Z1339 Encounter for screening examination for other mental health and behavioral disorders: Secondary | ICD-10-CM | POA: Diagnosis not present

## 2020-03-14 DIAGNOSIS — I25119 Atherosclerotic heart disease of native coronary artery with unspecified angina pectoris: Secondary | ICD-10-CM | POA: Diagnosis not present

## 2020-03-14 DIAGNOSIS — R413 Other amnesia: Secondary | ICD-10-CM | POA: Diagnosis not present

## 2020-03-14 DIAGNOSIS — Z87891 Personal history of nicotine dependence: Secondary | ICD-10-CM | POA: Diagnosis not present

## 2020-03-14 DIAGNOSIS — Z6828 Body mass index (BMI) 28.0-28.9, adult: Secondary | ICD-10-CM | POA: Diagnosis not present

## 2020-03-14 DIAGNOSIS — Z299 Encounter for prophylactic measures, unspecified: Secondary | ICD-10-CM | POA: Diagnosis not present

## 2020-03-14 DIAGNOSIS — I10 Essential (primary) hypertension: Secondary | ICD-10-CM | POA: Diagnosis not present

## 2020-03-14 DIAGNOSIS — F0390 Unspecified dementia without behavioral disturbance: Secondary | ICD-10-CM | POA: Diagnosis not present

## 2020-03-16 DIAGNOSIS — L570 Actinic keratosis: Secondary | ICD-10-CM | POA: Diagnosis not present

## 2020-03-16 DIAGNOSIS — Z08 Encounter for follow-up examination after completed treatment for malignant neoplasm: Secondary | ICD-10-CM | POA: Diagnosis not present

## 2020-03-16 DIAGNOSIS — Z85828 Personal history of other malignant neoplasm of skin: Secondary | ICD-10-CM | POA: Diagnosis not present

## 2020-03-16 DIAGNOSIS — L820 Inflamed seborrheic keratosis: Secondary | ICD-10-CM | POA: Diagnosis not present

## 2020-03-16 DIAGNOSIS — X32XXXD Exposure to sunlight, subsequent encounter: Secondary | ICD-10-CM | POA: Diagnosis not present

## 2020-04-21 ENCOUNTER — Ambulatory Visit (HOSPITAL_COMMUNITY)
Admission: RE | Admit: 2020-04-21 | Discharge: 2020-04-21 | Disposition: A | Discharge status: Home / Self Care | Payer: Medicare PPO | Source: Ambulatory Visit | Attending: Urology | Admitting: Urology

## 2020-04-21 DIAGNOSIS — N503 Cyst of epididymis: Secondary | ICD-10-CM | POA: Diagnosis not present

## 2020-04-21 DIAGNOSIS — N433 Hydrocele, unspecified: Secondary | ICD-10-CM | POA: Diagnosis not present

## 2020-04-21 DIAGNOSIS — I861 Scrotal varices: Secondary | ICD-10-CM | POA: Diagnosis not present

## 2020-04-21 DIAGNOSIS — N430 Encysted hydrocele: Secondary | ICD-10-CM | POA: Insufficient documentation

## 2020-04-24 ENCOUNTER — Encounter: Payer: Self-pay | Admitting: Cardiology

## 2020-04-24 ENCOUNTER — Other Ambulatory Visit: Payer: Self-pay

## 2020-04-24 ENCOUNTER — Ambulatory Visit: Payer: Medicare PPO | Admitting: Cardiology

## 2020-04-24 VITALS — BP 144/82 | HR 62 | Ht 72.0 in | Wt 216.0 lb

## 2020-04-24 DIAGNOSIS — E782 Mixed hyperlipidemia: Secondary | ICD-10-CM | POA: Diagnosis not present

## 2020-04-24 DIAGNOSIS — I25119 Atherosclerotic heart disease of native coronary artery with unspecified angina pectoris: Secondary | ICD-10-CM

## 2020-04-24 NOTE — Progress Notes (Signed)
Cardiology Office Note  Date: 04/24/2020   ID: Cole, Mayer 06-17-1935, MRN [id number]  PCP:  Monico Blitz, MD  Cardiologist:  Rozann Lesches, MD Electrophysiologist:  None   Chief Complaint  Patient presents with  . Cardiac follow-up    History of Present Illness: Cole Mayer is an 85 y.o. male last seen in March 2021.  He is here today with his wife for follow-up visit.  Continues to do well overall from a cardiac perspective, no active angina symptoms or nitroglycerin use.  Enjoys working in his garage and other activities to stay busy.  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  I went over his medications which are listed below.  He continues to follow-up with Dr. Manuella Ghazi.  Ischemic testing from 2019 is outlined below, we have continued observation in the absence of angina symptoms.  Past Medical History:  Diagnosis Date  . Coronary atherosclerosis of native coronary artery    DES LAD and DES x3 RCA 2005, LVEF 60%   . Essential hypertension   . Hypothyroidism   . Mixed hyperlipidemia   . Myocardial infarction ([facility])    1989 (Streptokinase) and 2005    Past Surgical History:  Procedure Laterality Date  . CHOLECYSTECTOMY    . COLONOSCOPY N/A 01/18/2015   Procedure: COLONOSCOPY;  Surgeon: Rogene Houston, MD;  Location: [facility];  Service: Endoscopy;  Laterality: N/A;  730  . CORONARY ANGIOPLASTY     Stents  . TONSILLECTOMY      Current Outpatient Medications  Medication Sig Dispense Refill  . aspirin EC 81 MG tablet Take 81 mg by mouth daily.    Marland Kitchen donepezil (ARICEPT) 10 MG tablet Take 10 mg by mouth at bedtime.    Marland Kitchen ipratropium (ATROVENT) 0.06 % nasal spray     . levothyroxine (SYNTHROID, LEVOTHROID) 112 MCG tablet Take 112 mcg by mouth daily before breakfast.     . MEMANTINE HCL ER PO Take 14 mg by mouth daily.    .  metoprolol succinate (TOPROL-XL) 50 MG 24 hr tablet Take 50 mg by mouth daily. Take with or immediately following a meal.    . nitroGLYCERIN (NITROSTAT) 0.4 MG SL tablet Place 1 tablet (0.4 mg total) under the tongue every 5 (five) minutes as needed. Up to 3 doses. 25 tablet 3  . ramipril (ALTACE) 10 MG capsule Take 10 mg by mouth daily.    . simvastatin (ZOCOR) 40 MG tablet Take 40 mg by mouth daily.    Marland Kitchen triamcinolone (KENALOG) 0.1 %      No current facility-administered medications for this visit.   Allergies:  Patient has no known allergies.   ROS: Memory loss.  Physical Exam: VS:  BP (!) 144/82   Pulse 62   Ht 6' (1.829 m)   Wt 216 lb (98 kg)   SpO2 97%   BMI 29.29 kg/m , BMI Body mass index is 29.29 kg/m.  Wt Readings from Last 3 Encounters:  04/24/20 216 lb (98 kg)  01/27/20 206 lb (93.4 kg)  04/05/19 200 lb (90.7 kg)    General: Elderly male, appears comfortable at rest. HEENT: Conjunctiva and lids normal, wearing a mask. Neck: Supple, no elevated JVP or carotid bruits, no thyromegaly. Lungs: Clear to auscultation, nonlabored breathing at rest. Cardiac: Regular rate and rhythm, no S3, soft systolic murmur, no pericardial rub. Extremities:  No pitting edema.  ECG:  An ECG dated 04/05/2019 was personally reviewed today and demonstrated:  Sinus rhythm with prolonged PR interval, right bundle branch block, lead motion artifact.  Recent Labwork:  November 2021: BUN 18, creatinine 1.06, potassium 4.2  Other Studies Reviewed Today:  Lexiscan Myoview 03/18/2017:  T wave inversion was noted during stress in the V3, V4 and III leads.  Defect 1: There is a medium defect of moderate severity present in the mid inferior, mid inferolateral, apical inferior and apical lateral location.  Findings may be consistent with prior myocardial infarction with a very mild degree of peri-infarct ischemia. However, variable soft tissue attenuation is also likely contributing to this  defect.  Nuclear stress EF: 64%.  Overall, this is a low risk study.  Assessment and Plan:  1.  CAD status post DES to the LAD and RCA in 2005.  Ischemic testing from 2019 was low risk as outlined above consistent with previous infarct scar.  ECG reviewed.  Continue aspirin, Toprol-XL, Altace, Zocor, and as needed nitroglycerin.  2.  Mixed hyperlipidemia, on Zocor.  He continues to follow with Dr. Manuella Ghazi for routine lab work.  Medication Adjustments/Labs and Tests Ordered: Current medicines are reviewed at length with the patient today.  Concerns regarding medicines are outlined above.   Tests Ordered: Orders Placed This Encounter  Procedures  . EKG 12-Lead    Medication Changes: No orders of the defined types were placed in this encounter.   Disposition:  Follow up 1 year in the [place] office.  Signed, Satira Sark, MD, [facility] 04/24/2020 4:01 PM    [facility] [place], [place], [place] [address] Phone: [phone]; Fax: [phone]

## 2020-04-24 NOTE — Patient Instructions (Signed)

## 2020-04-27 ENCOUNTER — Encounter: Payer: Self-pay | Admitting: Urology

## 2020-04-27 ENCOUNTER — Ambulatory Visit (INDEPENDENT_AMBULATORY_CARE_PROVIDER_SITE_OTHER): Payer: Medicare PPO | Admitting: Urology

## 2020-04-27 ENCOUNTER — Other Ambulatory Visit: Payer: Self-pay

## 2020-04-27 VITALS — BP 159/83 | HR 55 | Temp 98.3°F | Ht 72.0 in | Wt 216.0 lb

## 2020-04-27 DIAGNOSIS — N432 Other hydrocele: Secondary | ICD-10-CM | POA: Diagnosis not present

## 2020-04-27 LAB — MICROSCOPIC EXAMINATION
Epithelial Cells (non renal): NONE SEEN /hpf (ref 0–10)
Renal Epithel, UA: NONE SEEN /hpf

## 2020-04-27 LAB — URINALYSIS, ROUTINE W REFLEX MICROSCOPIC
Bilirubin, UA: NEGATIVE
Glucose, UA: NEGATIVE
Ketones, UA: NEGATIVE
Nitrite, UA: NEGATIVE
Protein,UA: NEGATIVE
RBC, UA: NEGATIVE
Specific Gravity, UA: 1.025 (ref 1.005–1.030)
Urobilinogen, Ur: 0.2 mg/dL (ref 0.2–1.0)
pH, UA: 5.5 (ref 5.0–7.5)

## 2020-04-27 NOTE — Progress Notes (Signed)
Urological Symptom Review  Patient is experiencing the following symptoms: Get up at night to urinate   Review of Systems  Gastrointestinal (upper)  : Negative for upper GI symptoms  Gastrointestinal (lower) : Negative for lower GI symptoms  Constitutional : Negative for symptoms  Skin: Negative for skin symptoms  Eyes: Negative   Ear/Nose/Throat : Negative for Ear/Nose/Throat symptoms  Hematologic/Lymphatic: Negative for Hematologic/Lymphatic symptoms  Cardiovascular : Negative for cardiovascular symptoms  Respiratory : Negative for respiratory symptoms  Endocrine: Negative for endocrine symptoms  Musculoskeletal: Negative for musculoskeletal symptoms  Neurological: Negative for neurological symptoms  Psychologic: Negative for psychiatric symptoms

## 2020-04-27 NOTE — Progress Notes (Signed)
Subjective:  1. Other hydrocele     04/27/20: Cole Mayer returns today in f/u with a repeat scrotal US which shows a stable complex left spermatocele.  He has no pain.  He is voiding well with an IPSS of 3. His UA is clear today.   01/27/20: Cole Mayer is sent back in consultation for the finding of mild left hydronephrosis on recent CT imaging.  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   More worrisome was some nodularity within a left hydrocele which was partially visualized.   He is having some low back pain but no scrotal or flank pain.  He is voiding ok with an IPSS of 5.   He had a scrotal US today that showed some right testicular atrophy and a 3.4cm septated cyst of the head of the epididymis and a small hydrocele.       ROS:  ROS:  A complete review of systems was performed.  All systems are negative except for pertinent findings as noted.   ROS  No Known Allergies  Outpatient Encounter Medications as of 04/27/2020  Medication Sig  . aspirin EC 81 MG tablet Take 81 mg by mouth daily.  Marland Kitchen donepezil (ARICEPT) 10 MG tablet Take 10 mg by mouth at bedtime.  Marland Kitchen ipratropium (ATROVENT) 0.06 % nasal spray   . levothyroxine (SYNTHROID, LEVOTHROID) 112 MCG tablet Take 112 mcg by mouth daily before breakfast.   . MEMANTINE HCL ER PO Take 14 mg by mouth daily.  . metoprolol succinate (TOPROL-XL) 50 MG 24 hr tablet Take 50 mg by mouth daily. Take with or immediately following a meal.  . nitroGLYCERIN (NITROSTAT) 0.4 MG SL tablet Place 1 tablet (0.4 mg total) under the tongue every 5 (five) minutes as needed. Up to 3 doses.  . ramipril (ALTACE) 10 MG capsule Take 10 mg by mouth daily.  . simvastatin (ZOCOR) 40 MG tablet Take 40 mg by mouth daily.  Marland Kitchen triamcinolone (KENALOG) 0.1 %    No facility-administered encounter medications on file as of 04/27/2020.    Past Medical History:  Diagnosis Date  .  Coronary atherosclerosis of native coronary artery    DES LAD and DES x3 RCA 2005, LVEF 60%   . Essential hypertension   . Hypothyroidism   . Mixed hyperlipidemia   . Myocardial infarction ([place])    1989 (Streptokinase) and 2005    Past Surgical History:  Procedure Laterality Date  . CHOLECYSTECTOMY    . COLONOSCOPY N/A 01/18/2015   Procedure: COLONOSCOPY;  Surgeon: Rogene Houston, MD;  Location: [facility];  Service: Endoscopy;  Laterality: N/A;  730  . CORONARY ANGIOPLASTY     Stents  . TONSILLECTOMY      Social History   Socioeconomic History  . Marital status: Married    Spouse name: Not on file  . Number of children: Not on file  . Years of education: Not on file  . Highest education level: Not on file  Occupational History  . 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 Smoking status: Former Smoker    Packs/day: 0.80    Years: 30.00    Pack years: 24.00    Types: Cigarettes    Start date: 09/08/1957    Quit date: 01/05/1988    Years since quitting: 32.3  . Smokeless tobacco: Former Systems developer  Types: Chew  . Tobacco comment: chewed a little  Vaping Use  . Vaping Use: Never used  Substance and Sexual Activity  . Alcohol use: Yes    Comment: Occasional  . Drug use: No  . Sexual activity: Not on file  Other Topics Concern  . Not on file  Social History Narrative  . Not on file   Social Determinants of Health   Financial Resource Strain: Not on file  Food Insecurity: Not on file  Transportation Needs: Not on file  Physical Activity: Not on file  Stress: Not on file  Social Connections: Not on file  Intimate Partner Violence: Not on file    Family History  Problem Relation Age of Onset  . Coronary artery disease Other   . Thyroid disease Father   . Hypertension Father        Objective: Vitals:   04/27/20 1355  BP: (!) 159/83  Pulse: (!) 55  Temp: 98.3 F (36.8 C)     Physical Exam  Lab Results:   PSA No results found for: PSA No results found for: TESTOSTERONE  UA is unremarkable today.  Studies/Results: CLINICAL DATA:  Epididymal cyst follow-up  EXAM: SCROTAL ULTRASOUND  DOPPLER ULTRASOUND OF THE TESTICLES  TECHNIQUE: Complete ultrasound examination of the testicles, epididymis, and other scrotal structures was performed. Color and spectral Doppler ultrasound were also utilized to evaluate blood flow to the testicles.  COMPARISON:  None.  FINDINGS: Right testicle  Measurements: 4.8 x 1.8 x 2.6 cm. There is no testicular mass. The testicle is heterogeneous in appearance. The testicle is hypoechoic when compared to the right and demonstrates overall decreased color Doppler flow  Left testicle  Measurements: 5 x 2.4 x 3.4 cm. There is a prominent rete testes. There is no testicular mass.  Right epididymis:  There is a epididymal head cyst measuring 6 mm  Left epididymis: Again noted is a complex cystic mass involving the left epididymis. This measures approximately 2.4 x 1.8 x 1.9 cm and a appears to contain internal septations and debris.  Hydrocele:  There is small bilateral hydroceles.  Varicocele:  None visualized.  Pulsed Doppler interrogation of both testes demonstrates normal arterial waveforms bilaterally. A venous waveform is again difficult to obtain involving the right testicle.  IMPRESSION: 1. Relatively stable complex cystic structure involving the left epididymis. This is favored to represent a complex spermatocele. 2. Persistent abnormal appearance of the right testicle with persistent difficulty in obtaining a venous waveform. This is felt to be secondary to a prior infectious or inflammatory insult. Partial torsion is a consideration but is felt to be less likely in the absence of pain. 3. No testicular mass.   Electronically Signed   By: Constance Holster M.D.   On: 04/23/2020 19:12    Assessment & Plan: 1.  Left epdidymal cyst.  The cyst is complex but stable.   I will just have him return in a year for an exam.   No orders of the defined types were placed in this encounter.    Orders Placed This Encounter  Procedures  . Microscopic Examination  . Urinalysis, Routine w reflex microscopic      Return in about 1 year (around 04/27/2021).   CC: Monico Blitz, MD      Irine Seal 04/27/2020

## 2020-04-28 ENCOUNTER — Ambulatory Visit: Payer: Medicare PPO | Admitting: Family Medicine

## 2020-05-04 DIAGNOSIS — C4442 Squamous cell carcinoma of skin of scalp and neck: Secondary | ICD-10-CM | POA: Diagnosis not present

## 2020-05-04 DIAGNOSIS — Z08 Encounter for follow-up examination after completed treatment for malignant neoplasm: Secondary | ICD-10-CM | POA: Diagnosis not present

## 2020-05-04 DIAGNOSIS — Z85828 Personal history of other malignant neoplasm of skin: Secondary | ICD-10-CM | POA: Diagnosis not present

## 2020-05-05 DIAGNOSIS — I440 Atrioventricular block, first degree: Secondary | ICD-10-CM | POA: Diagnosis not present

## 2020-05-05 DIAGNOSIS — I959 Hypotension, unspecified: Secondary | ICD-10-CM | POA: Diagnosis not present

## 2020-05-05 DIAGNOSIS — I4510 Unspecified right bundle-branch block: Secondary | ICD-10-CM | POA: Diagnosis not present

## 2020-05-05 DIAGNOSIS — E860 Dehydration: Secondary | ICD-10-CM | POA: Diagnosis not present

## 2020-05-05 DIAGNOSIS — R197 Diarrhea, unspecified: Secondary | ICD-10-CM | POA: Diagnosis not present

## 2020-05-05 DIAGNOSIS — R9431 Abnormal electrocardiogram [ECG] [EKG]: Secondary | ICD-10-CM | POA: Diagnosis not present

## 2020-05-05 DIAGNOSIS — R55 Syncope and collapse: Secondary | ICD-10-CM | POA: Diagnosis not present

## 2020-05-05 DIAGNOSIS — R0902 Hypoxemia: Secondary | ICD-10-CM | POA: Diagnosis not present

## 2020-05-05 DIAGNOSIS — I2510 Atherosclerotic heart disease of native coronary artery without angina pectoris: Secondary | ICD-10-CM | POA: Diagnosis not present

## 2020-05-05 DIAGNOSIS — R001 Bradycardia, unspecified: Secondary | ICD-10-CM | POA: Diagnosis not present

## 2020-05-05 DIAGNOSIS — R0689 Other abnormalities of breathing: Secondary | ICD-10-CM | POA: Diagnosis not present

## 2020-05-09 DIAGNOSIS — Z299 Encounter for prophylactic measures, unspecified: Secondary | ICD-10-CM | POA: Diagnosis not present

## 2020-05-09 DIAGNOSIS — I10 Essential (primary) hypertension: Secondary | ICD-10-CM | POA: Diagnosis not present

## 2020-05-09 DIAGNOSIS — S41119A Laceration without foreign body of unspecified upper arm, initial encounter: Secondary | ICD-10-CM | POA: Diagnosis not present

## 2020-05-09 DIAGNOSIS — R55 Syncope and collapse: Secondary | ICD-10-CM | POA: Diagnosis not present

## 2020-05-16 DIAGNOSIS — Z299 Encounter for prophylactic measures, unspecified: Secondary | ICD-10-CM | POA: Diagnosis not present

## 2020-05-16 DIAGNOSIS — I10 Essential (primary) hypertension: Secondary | ICD-10-CM | POA: Diagnosis not present

## 2020-05-16 DIAGNOSIS — Z713 Dietary counseling and surveillance: Secondary | ICD-10-CM | POA: Diagnosis not present

## 2020-05-16 DIAGNOSIS — Z6830 Body mass index (BMI) 30.0-30.9, adult: Secondary | ICD-10-CM | POA: Diagnosis not present

## 2020-05-26 ENCOUNTER — Ambulatory Visit: Payer: Medicare PPO | Admitting: Cardiology

## 2020-05-31 DIAGNOSIS — S41119A Laceration without foreign body of unspecified upper arm, initial encounter: Secondary | ICD-10-CM | POA: Diagnosis not present

## 2020-05-31 DIAGNOSIS — L03119 Cellulitis of unspecified part of limb: Secondary | ICD-10-CM | POA: Diagnosis not present

## 2020-05-31 DIAGNOSIS — Z6830 Body mass index (BMI) 30.0-30.9, adult: Secondary | ICD-10-CM | POA: Diagnosis not present

## 2020-05-31 DIAGNOSIS — N183 Chronic kidney disease, stage 3 unspecified: Secondary | ICD-10-CM | POA: Diagnosis not present

## 2020-05-31 DIAGNOSIS — Z87891 Personal history of nicotine dependence: Secondary | ICD-10-CM | POA: Diagnosis not present

## 2020-05-31 DIAGNOSIS — Z299 Encounter for prophylactic measures, unspecified: Secondary | ICD-10-CM | POA: Diagnosis not present

## 2020-05-31 DIAGNOSIS — I10 Essential (primary) hypertension: Secondary | ICD-10-CM | POA: Diagnosis not present

## 2020-06-15 DIAGNOSIS — D225 Melanocytic nevi of trunk: Secondary | ICD-10-CM | POA: Diagnosis not present

## 2020-06-15 DIAGNOSIS — X32XXXD Exposure to sunlight, subsequent encounter: Secondary | ICD-10-CM | POA: Diagnosis not present

## 2020-06-15 DIAGNOSIS — Z85828 Personal history of other malignant neoplasm of skin: Secondary | ICD-10-CM | POA: Diagnosis not present

## 2020-06-15 DIAGNOSIS — Z08 Encounter for follow-up examination after completed treatment for malignant neoplasm: Secondary | ICD-10-CM | POA: Diagnosis not present

## 2020-06-15 DIAGNOSIS — Z1283 Encounter for screening for malignant neoplasm of skin: Secondary | ICD-10-CM | POA: Diagnosis not present

## 2020-06-15 DIAGNOSIS — L570 Actinic keratosis: Secondary | ICD-10-CM | POA: Diagnosis not present

## 2020-06-27 DIAGNOSIS — Z23 Encounter for immunization: Secondary | ICD-10-CM | POA: Diagnosis not present

## 2020-07-13 DIAGNOSIS — L570 Actinic keratosis: Secondary | ICD-10-CM | POA: Diagnosis not present

## 2020-07-13 DIAGNOSIS — Z85828 Personal history of other malignant neoplasm of skin: Secondary | ICD-10-CM | POA: Diagnosis not present

## 2020-07-13 DIAGNOSIS — X32XXXD Exposure to sunlight, subsequent encounter: Secondary | ICD-10-CM | POA: Diagnosis not present

## 2020-07-13 DIAGNOSIS — Z08 Encounter for follow-up examination after completed treatment for malignant neoplasm: Secondary | ICD-10-CM | POA: Diagnosis not present

## 2020-08-03 DIAGNOSIS — Z08 Encounter for follow-up examination after completed treatment for malignant neoplasm: Secondary | ICD-10-CM | POA: Diagnosis not present

## 2020-08-03 DIAGNOSIS — L928 Other granulomatous disorders of the skin and subcutaneous tissue: Secondary | ICD-10-CM | POA: Diagnosis not present

## 2020-08-03 DIAGNOSIS — Z85828 Personal history of other malignant neoplasm of skin: Secondary | ICD-10-CM | POA: Diagnosis not present

## 2020-09-01 DIAGNOSIS — G309 Alzheimer's disease, unspecified: Secondary | ICD-10-CM | POA: Diagnosis not present

## 2020-09-01 DIAGNOSIS — N2581 Secondary hyperparathyroidism of renal origin: Secondary | ICD-10-CM | POA: Diagnosis not present

## 2020-09-01 DIAGNOSIS — F0280 Dementia in other diseases classified elsewhere without behavioral disturbance: Secondary | ICD-10-CM | POA: Diagnosis not present

## 2020-09-01 DIAGNOSIS — Z6830 Body mass index (BMI) 30.0-30.9, adult: Secondary | ICD-10-CM | POA: Diagnosis not present

## 2020-09-01 DIAGNOSIS — I10 Essential (primary) hypertension: Secondary | ICD-10-CM | POA: Diagnosis not present

## 2020-09-01 DIAGNOSIS — Z299 Encounter for prophylactic measures, unspecified: Secondary | ICD-10-CM | POA: Diagnosis not present

## 2020-10-19 DIAGNOSIS — Z23 Encounter for immunization: Secondary | ICD-10-CM | POA: Diagnosis not present

## 2020-11-02 DIAGNOSIS — X32XXXD Exposure to sunlight, subsequent encounter: Secondary | ICD-10-CM | POA: Diagnosis not present

## 2020-11-02 DIAGNOSIS — L928 Other granulomatous disorders of the skin and subcutaneous tissue: Secondary | ICD-10-CM | POA: Diagnosis not present

## 2020-11-02 DIAGNOSIS — L570 Actinic keratosis: Secondary | ICD-10-CM | POA: Diagnosis not present

## 2020-11-02 DIAGNOSIS — C44722 Squamous cell carcinoma of skin of right lower limb, including hip: Secondary | ICD-10-CM | POA: Diagnosis not present

## 2020-11-07 DIAGNOSIS — H9313 Tinnitus, bilateral: Secondary | ICD-10-CM | POA: Diagnosis not present

## 2020-11-07 DIAGNOSIS — R0982 Postnasal drip: Secondary | ICD-10-CM | POA: Diagnosis not present

## 2020-11-07 DIAGNOSIS — H903 Sensorineural hearing loss, bilateral: Secondary | ICD-10-CM | POA: Diagnosis not present

## 2020-11-07 DIAGNOSIS — J342 Deviated nasal septum: Secondary | ICD-10-CM | POA: Diagnosis not present

## 2020-11-07 DIAGNOSIS — J343 Hypertrophy of nasal turbinates: Secondary | ICD-10-CM | POA: Diagnosis not present

## 2020-11-07 DIAGNOSIS — H6123 Impacted cerumen, bilateral: Secondary | ICD-10-CM | POA: Diagnosis not present

## 2020-11-07 DIAGNOSIS — J310 Chronic rhinitis: Secondary | ICD-10-CM | POA: Diagnosis not present

## 2020-11-08 DIAGNOSIS — Z23 Encounter for immunization: Secondary | ICD-10-CM | POA: Diagnosis not present

## 2020-11-08 DIAGNOSIS — Z299 Encounter for prophylactic measures, unspecified: Secondary | ICD-10-CM | POA: Diagnosis not present

## 2020-12-07 DIAGNOSIS — S80812A Abrasion, left lower leg, initial encounter: Secondary | ICD-10-CM | POA: Diagnosis not present

## 2020-12-07 DIAGNOSIS — Z08 Encounter for follow-up examination after completed treatment for malignant neoplasm: Secondary | ICD-10-CM | POA: Diagnosis not present

## 2020-12-07 DIAGNOSIS — Z85828 Personal history of other malignant neoplasm of skin: Secondary | ICD-10-CM | POA: Diagnosis not present

## 2020-12-15 DIAGNOSIS — G309 Alzheimer's disease, unspecified: Secondary | ICD-10-CM | POA: Diagnosis not present

## 2020-12-15 DIAGNOSIS — J069 Acute upper respiratory infection, unspecified: Secondary | ICD-10-CM | POA: Diagnosis not present

## 2020-12-15 DIAGNOSIS — F0280 Dementia in other diseases classified elsewhere without behavioral disturbance: Secondary | ICD-10-CM | POA: Diagnosis not present

## 2020-12-15 DIAGNOSIS — I10 Essential (primary) hypertension: Secondary | ICD-10-CM | POA: Diagnosis not present

## 2020-12-15 DIAGNOSIS — Z299 Encounter for prophylactic measures, unspecified: Secondary | ICD-10-CM | POA: Diagnosis not present

## 2021-01-25 ENCOUNTER — Ambulatory Visit: Payer: Medicare PPO | Admitting: Urology

## 2021-03-05 DIAGNOSIS — E039 Hypothyroidism, unspecified: Secondary | ICD-10-CM | POA: Diagnosis not present

## 2021-03-05 DIAGNOSIS — Z6830 Body mass index (BMI) 30.0-30.9, adult: Secondary | ICD-10-CM | POA: Diagnosis not present

## 2021-03-05 DIAGNOSIS — R5383 Other fatigue: Secondary | ICD-10-CM | POA: Diagnosis not present

## 2021-03-05 DIAGNOSIS — Z7189 Other specified counseling: Secondary | ICD-10-CM | POA: Diagnosis not present

## 2021-03-05 DIAGNOSIS — E7800 Pure hypercholesterolemia, unspecified: Secondary | ICD-10-CM | POA: Diagnosis not present

## 2021-03-05 DIAGNOSIS — Z1331 Encounter for screening for depression: Secondary | ICD-10-CM | POA: Diagnosis not present

## 2021-03-05 DIAGNOSIS — Z79899 Other long term (current) drug therapy: Secondary | ICD-10-CM | POA: Diagnosis not present

## 2021-03-05 DIAGNOSIS — Z299 Encounter for prophylactic measures, unspecified: Secondary | ICD-10-CM | POA: Diagnosis not present

## 2021-03-05 DIAGNOSIS — Z1339 Encounter for screening examination for other mental health and behavioral disorders: Secondary | ICD-10-CM | POA: Diagnosis not present

## 2021-03-05 DIAGNOSIS — I10 Essential (primary) hypertension: Secondary | ICD-10-CM | POA: Diagnosis not present

## 2021-03-05 DIAGNOSIS — Z125 Encounter for screening for malignant neoplasm of prostate: Secondary | ICD-10-CM | POA: Diagnosis not present

## 2021-03-05 DIAGNOSIS — Z0000 Encounter for general adult medical examination without abnormal findings: Secondary | ICD-10-CM | POA: Diagnosis not present

## 2021-03-15 DIAGNOSIS — Z08 Encounter for follow-up examination after completed treatment for malignant neoplasm: Secondary | ICD-10-CM | POA: Diagnosis not present

## 2021-03-15 DIAGNOSIS — L928 Other granulomatous disorders of the skin and subcutaneous tissue: Secondary | ICD-10-CM | POA: Diagnosis not present

## 2021-03-15 DIAGNOSIS — Z85828 Personal history of other malignant neoplasm of skin: Secondary | ICD-10-CM | POA: Diagnosis not present

## 2021-04-02 IMAGING — CT CT RENAL STONE PROTOCOL
2 of 4 series · 15 of 46 positions shown, 17 images · non-contrast
Comparison: Ultrasound exam 12/14/2019.  CT scan 06/19/2018.

CLINICAL DATA: Hydronephrosis.

EXAM:
CT ABDOMEN AND PELVIS WITHOUT CONTRAST
TECHNIQUE: Multidetector CT imaging of the abdomen and pelvis was performed
following the standard protocol without IV contrast.

[Series 2: axial st · axial · 0.78mm/px · z∈[-346,+74]mm · 12 of 96 slices shown, 14 images]
[im 6/96  soft-tissue]
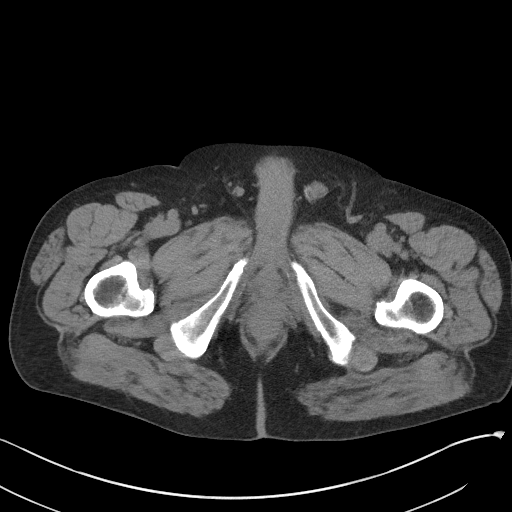
[im 6/96  bone]
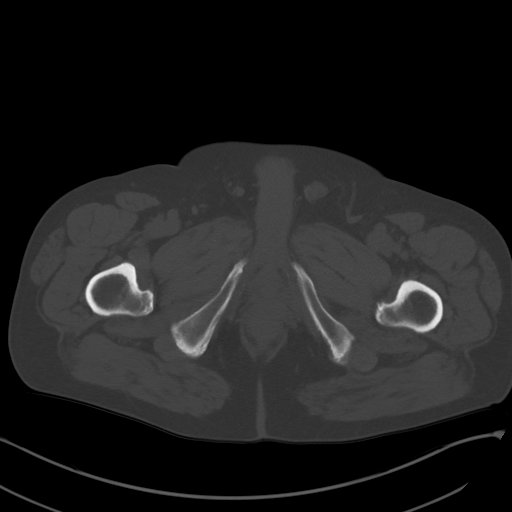
[im 17/96  soft-tissue]
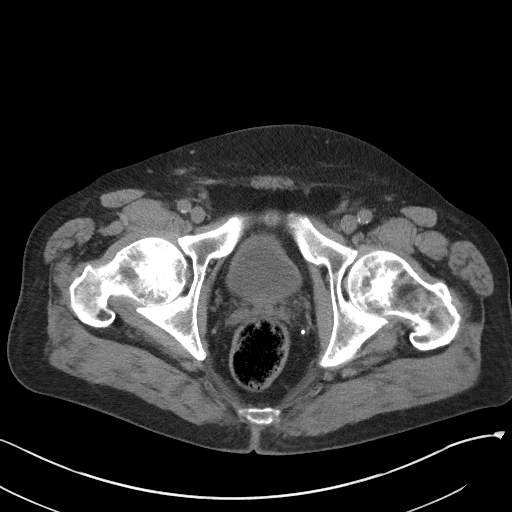
[im 23/96  soft-tissue]
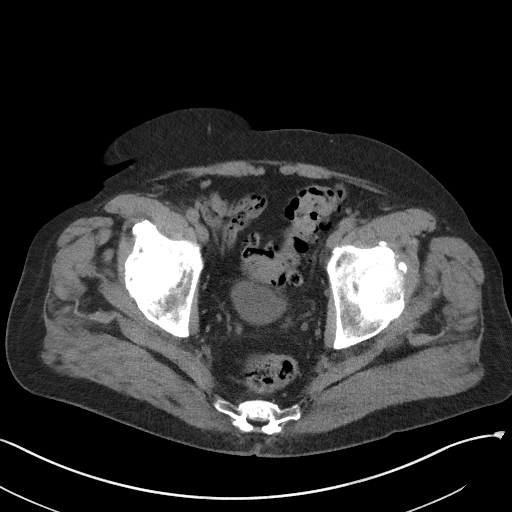
[im 28/96  soft-tissue]
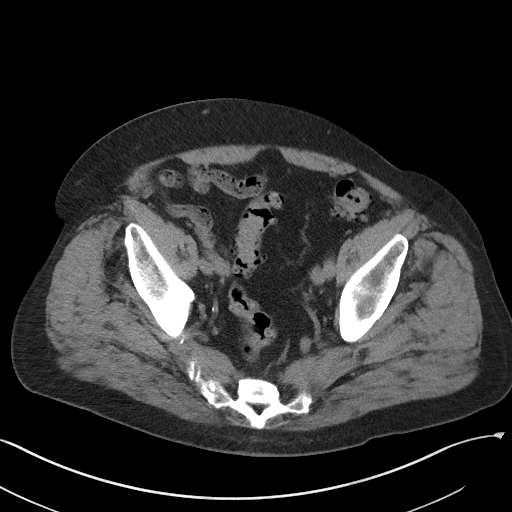
[im 40/96  soft-tissue]
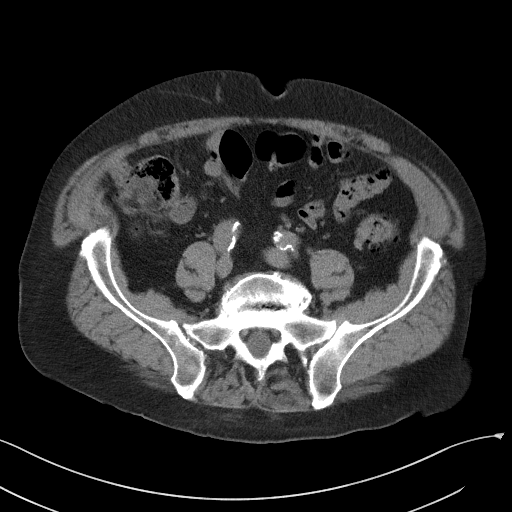
[im 45/96  soft-tissue]
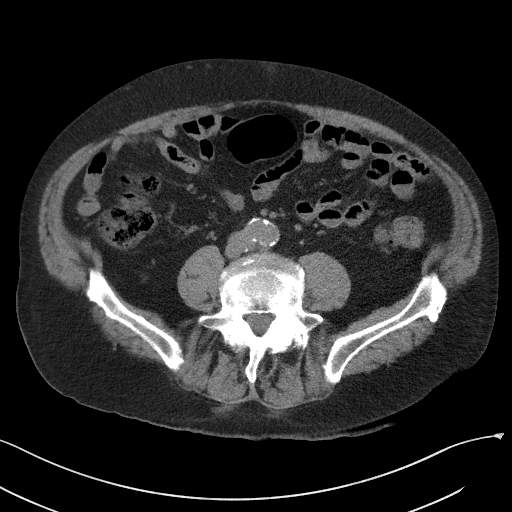
[im 51/96  soft-tissue]
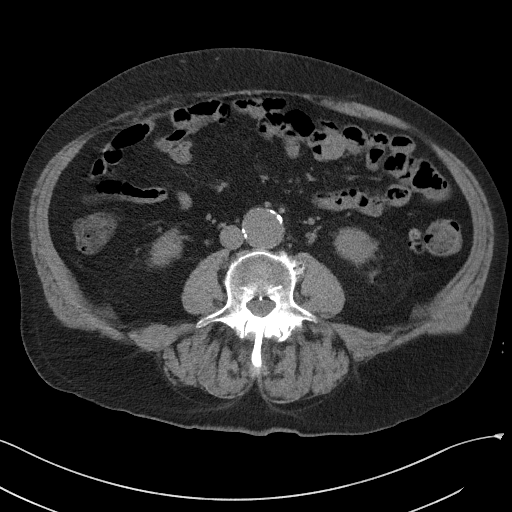
[im 62/96  soft-tissue]
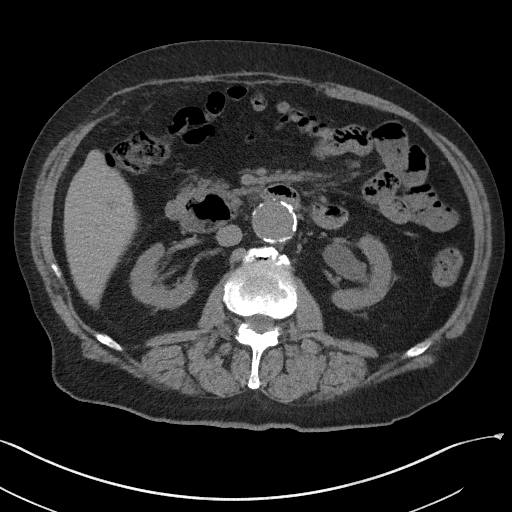
[im 68/96  soft-tissue]
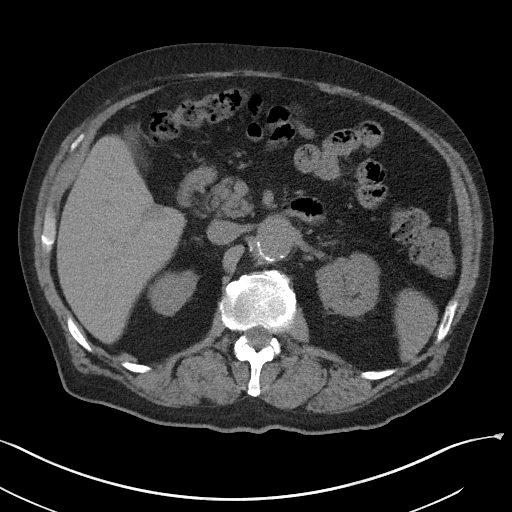
[im 68/96  bone]
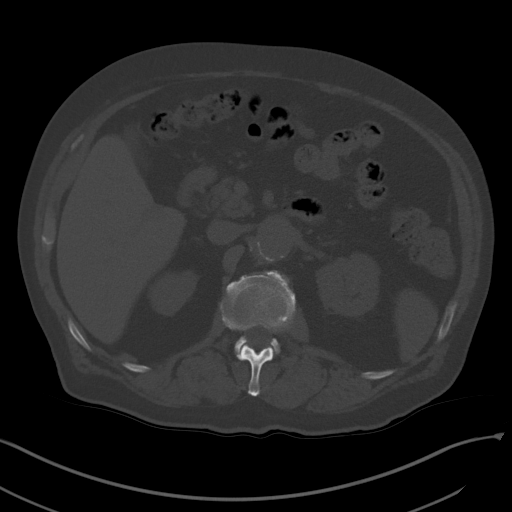
[im 73/96  soft-tissue]
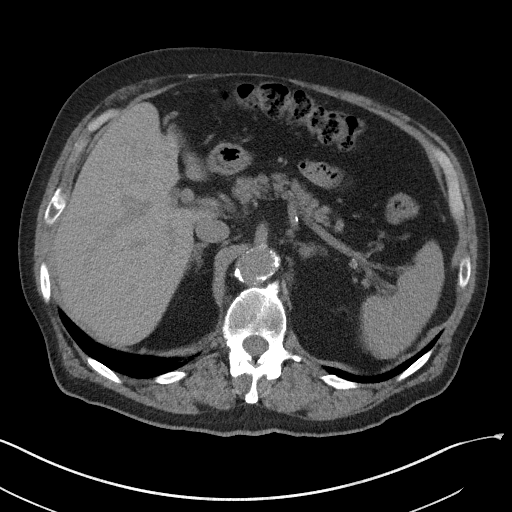
[im 84/96  soft-tissue]
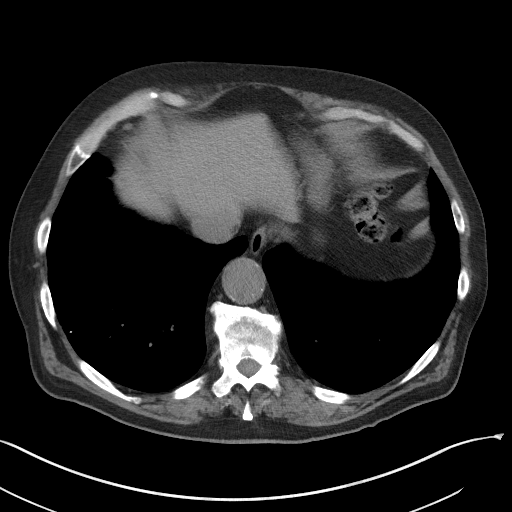
[im 90/96  soft-tissue]
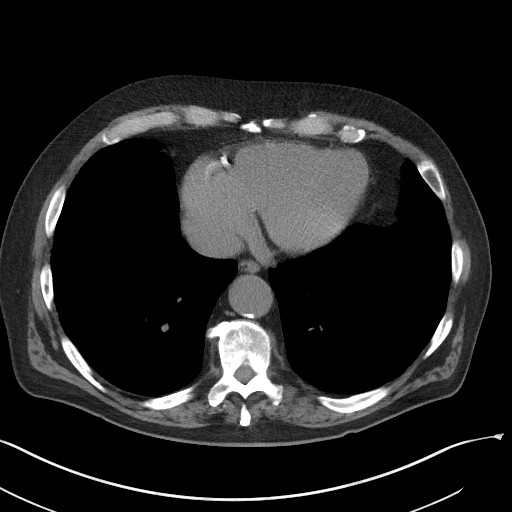

[Series 5: coronal st · coronal · 0.79mm/px · 3 of 104 slices shown]
[im 35/104  soft-tissue]
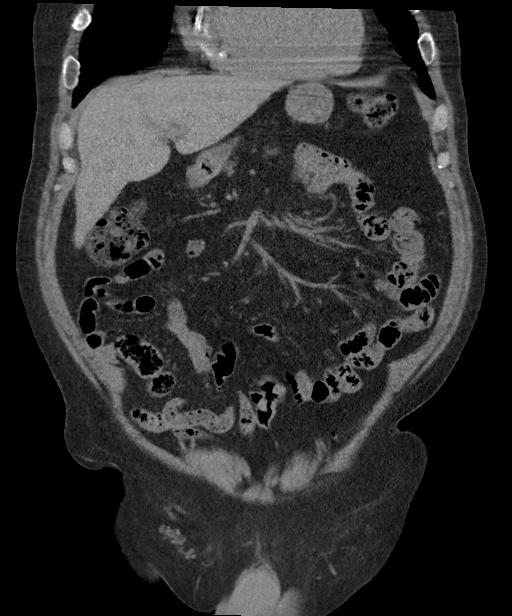
[im 46/104  soft-tissue]
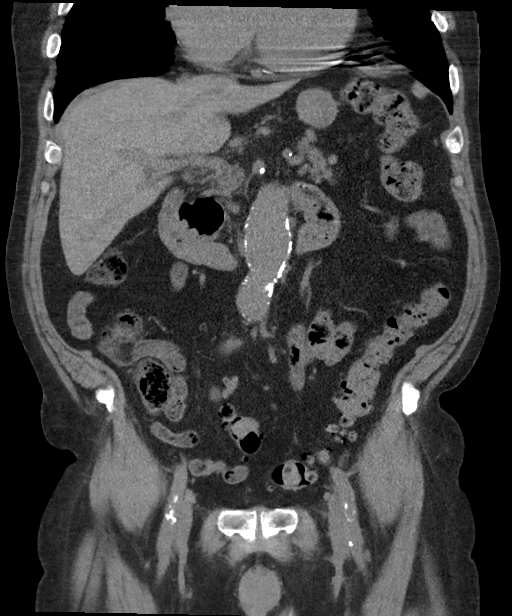
[im 58/104  soft-tissue]
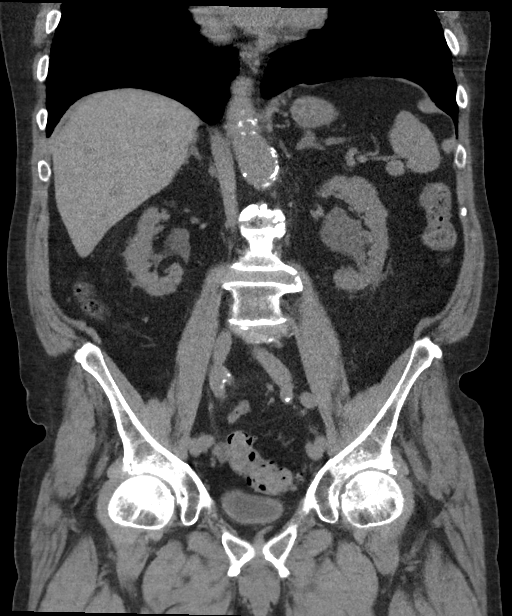

[15 of 46 positions shown; findings below may reference images not displayed]

FINDINGS: Lower chest: Fine detail of lung parenchyma in the lower lungs is
obscured by breathing motion. Calcified granuloma noted posterior
right lower lobe.

Hepatobiliary: No focal abnormality in the liver on this study
without intravenous contrast. Nonvisualization of the gallbladder
suggest cholecystectomy. No intrahepatic or extrahepatic biliary
dilation.

Pancreas: No focal mass lesion. No dilatation of the main duct. No
intraparenchymal cyst. No peripancreatic edema.

Spleen: Small exophytic process medial spleen is stable, consistent
with benign etiology.

Adrenals/Urinary Tract: No adrenal nodule or mass. No stones are
seen in either kidney. No caliectasis on the right with mild
prominence of the right renal pelvis. No associated right
hydroureter.

Minimal fullness noted left intrarenal collecting system and renal
pelvis, similar to prior study. Coronal imaging shows a punctate 2
mm nonobstructing stone in the upper pole the left kidney in
relatively abrupt transition from the distended renal pelvis to the
ureter (see coronal 55/5) suggests component of UPJ obstruction. No
left hydroureter or evidence of stone in the left ureter.

The urinary bladder appears normal for the degree of distention.

Stomach/Bowel: Stomach is unremarkable. No gastric wall thickening.
No evidence of outlet obstruction. Duodenum is normally positioned
as is the ligament of Treitz. Duodenal diverticulum noted. No small
bowel wall thickening. No small bowel dilatation. The terminal ileum
is normal. The appendix is normal. No gross colonic mass. No colonic
wall thickening. Diverticular changes are noted in the left colon
without evidence of diverticulitis.

Vascular/Lymphatic: There is abdominal aortic atherosclerosis.
Infrarenal abdominal aorta measures up to 3.9 cm diameter. There is
no gastrohepatic or hepatoduodenal ligament lymphadenopathy. No
retroperitoneal or mesenteric lymphadenopathy. No pelvic sidewall
lymphadenopathy.

Reproductive: Dystrophic calcification noted in the prostate gland.
Abnormal appearance of the upper left hemiscrotum with apparent
peripheral nodular soft tissue (see axial 96/2).

Other: No intraperitoneal free fluid.

Musculoskeletal: No worrisome lytic or sclerotic osseous
abnormality.
IMPRESSION: 1. Minimal fullness of the left intrarenal collecting system and
renal pelvis with relatively abrupt transition to collapsed left
ureter. Imaging features suggest component of UPJ obstruction.
Imaging features similar to CT scan of 06/19/2018.
2. No right hydronephrosis or hydroureter.
3. 2 mm nonobstructing stone upper pole left kidney.
4. Abnormal appearance of the upper left hemiscrotum with apparent
peripheral nodular soft tissue. This is incompletely visualized on
today's exam. Dedicated scrotal ultrasound recommended to further
evaluate.
5. Aortic Atherosclerosis ([id number]).

These results will be called to the ordering clinician or
representative by the Radiologist Assistant, and communication
documented in the PACS or [REDACTED].

## 2021-04-03 DIAGNOSIS — Z6829 Body mass index (BMI) 29.0-29.9, adult: Secondary | ICD-10-CM | POA: Diagnosis not present

## 2021-04-03 DIAGNOSIS — Z713 Dietary counseling and surveillance: Secondary | ICD-10-CM | POA: Diagnosis not present

## 2021-04-03 DIAGNOSIS — I10 Essential (primary) hypertension: Secondary | ICD-10-CM | POA: Diagnosis not present

## 2021-04-03 DIAGNOSIS — Z299 Encounter for prophylactic measures, unspecified: Secondary | ICD-10-CM | POA: Diagnosis not present

## 2021-04-03 DIAGNOSIS — I868 Varicose veins of other specified sites: Secondary | ICD-10-CM | POA: Diagnosis not present

## 2021-04-05 IMAGING — US US SCROTUM W/ DOPPLER COMPLETE
1 series · 13 of 25 positions shown · non-contrast
Comparison: None.

CLINICAL DATA: Hydrocele

EXAM:
SCROTAL ULTRASOUND
DOPPLER ULTRASOUND OF THE TESTICLES
TECHNIQUE: Complete ultrasound examination of the testicles, epididymis, and
other scrotal structures was performed. Color and spectral Doppler
ultrasound were also utilized to evaluate blood flow to the
testicles.

[Series 1: us scrotum w/doppler · 13 of 58 slices shown]
[im 1/58]
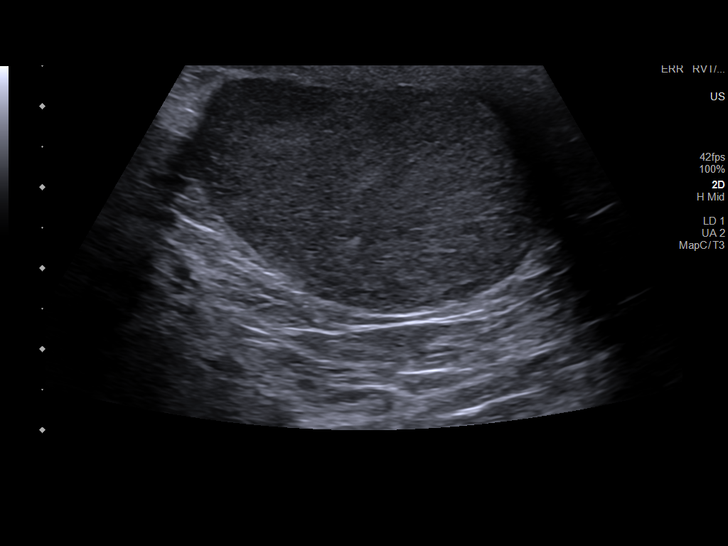
[im 5/58]
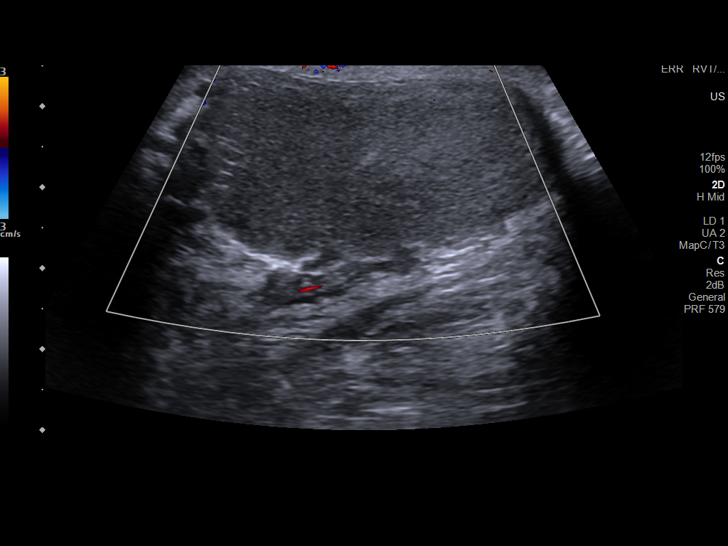
[im 10/58]
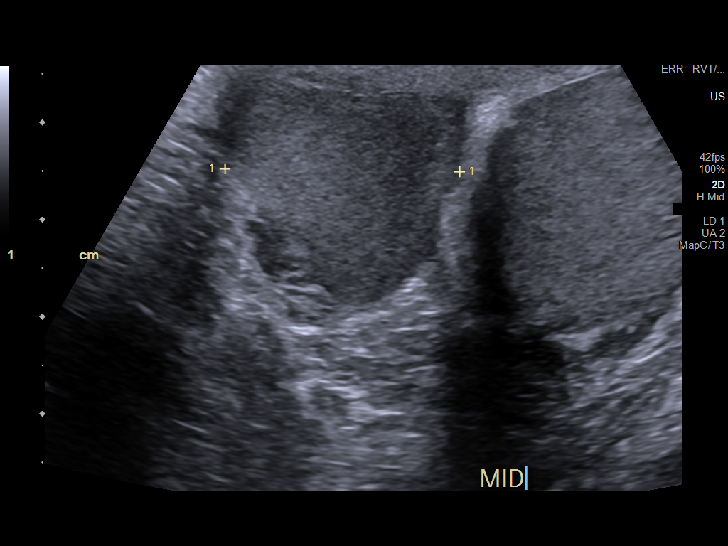
[im 15/58]
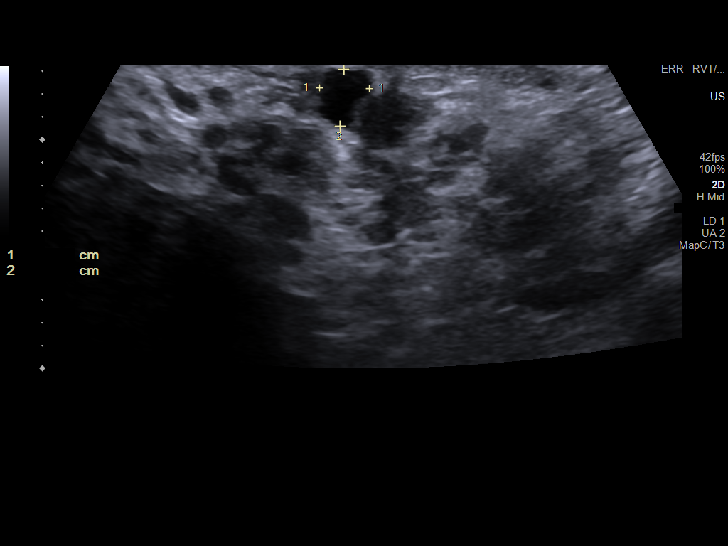
[im 20/58]
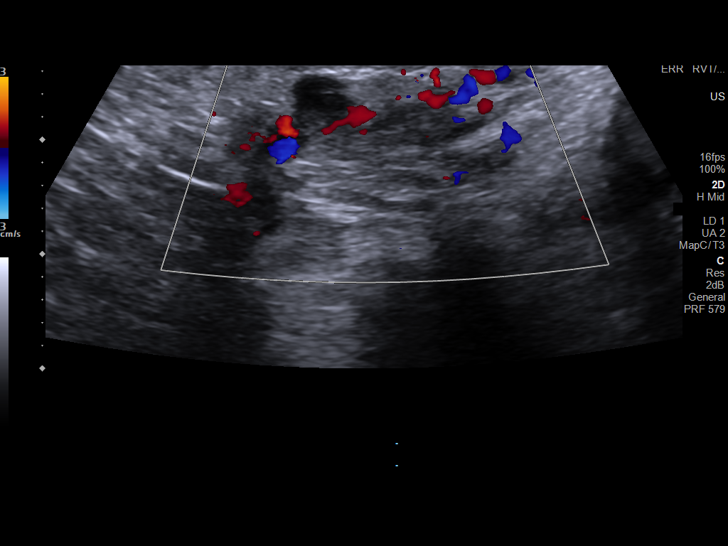
[im 24/58]
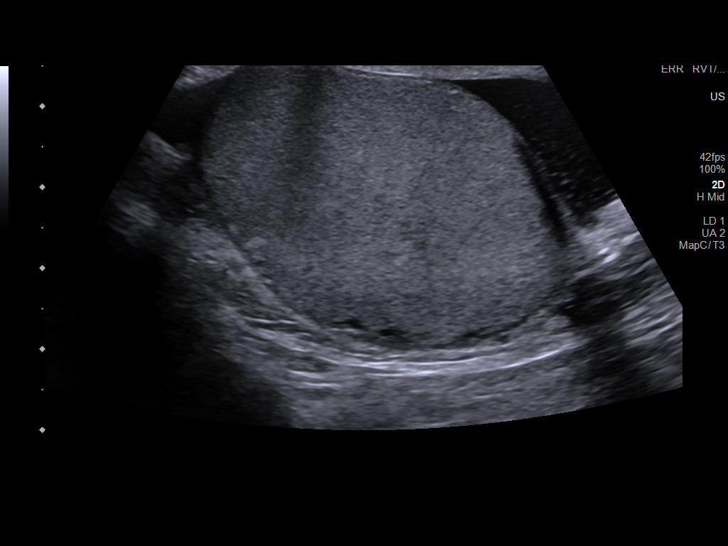
[im 29/58]
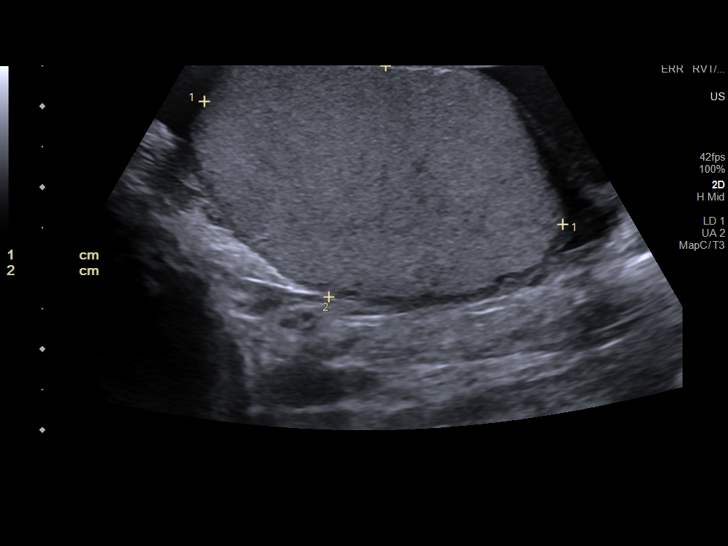
[im 34/58]
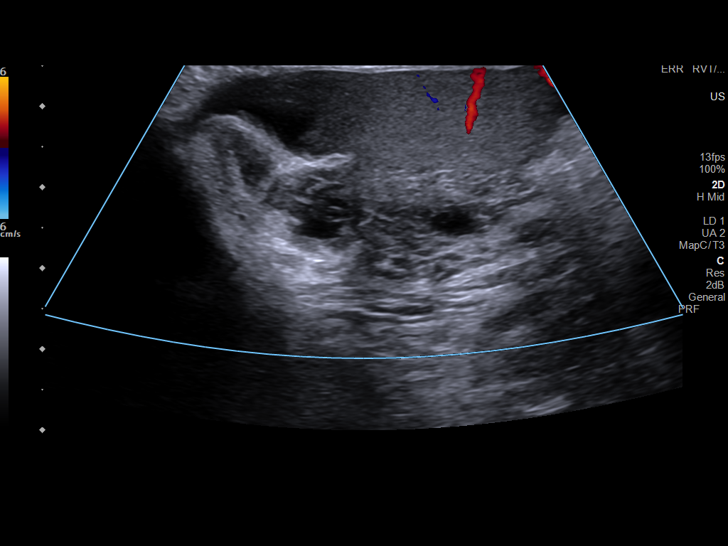
[im 39/58]
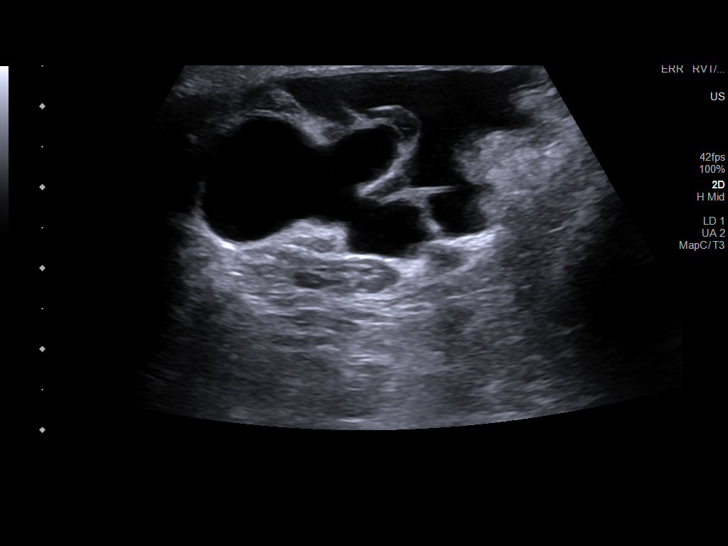
[im 43/58]
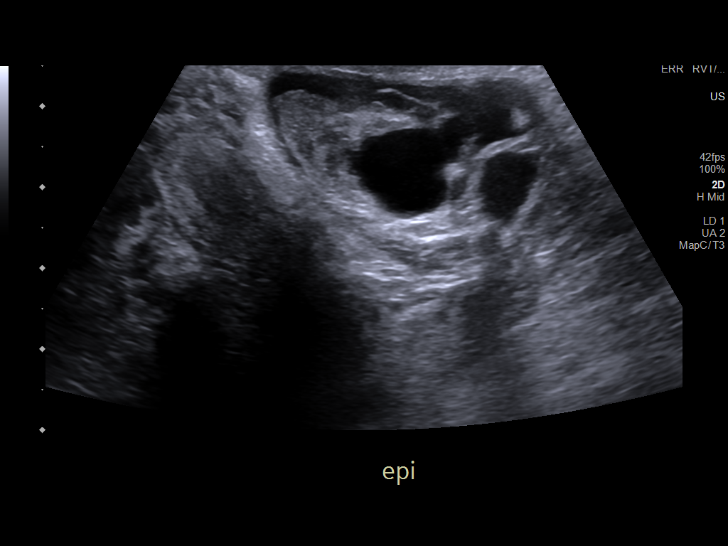
[im 48/58]
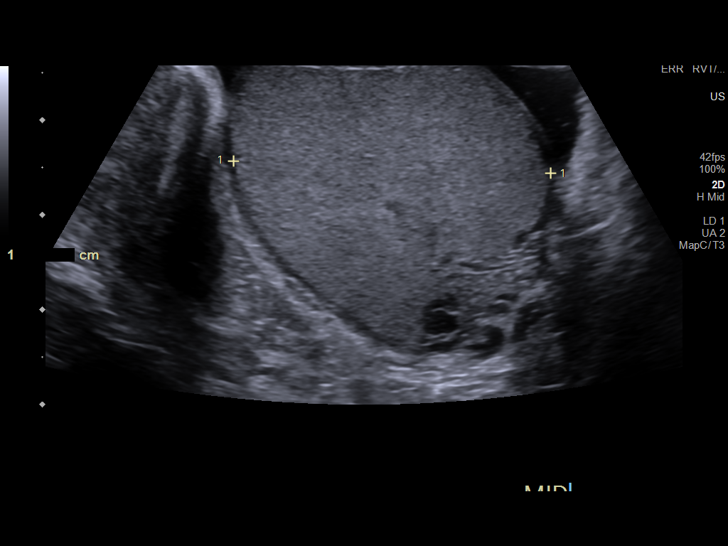
[im 53/58]
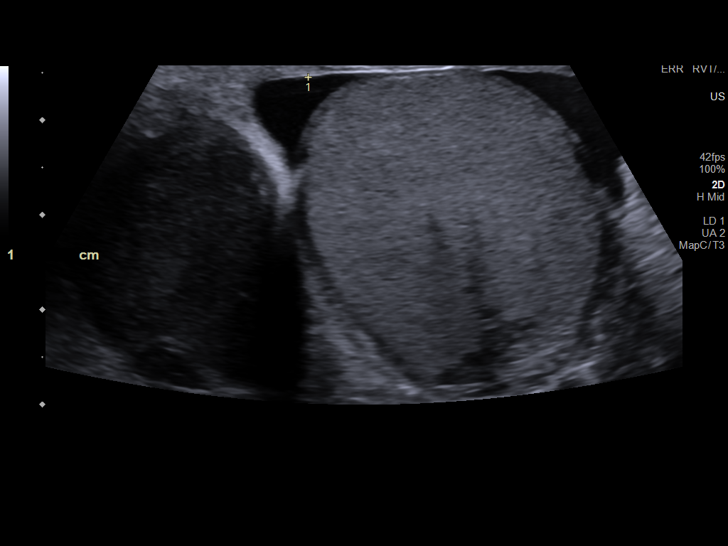
[im 58/58]
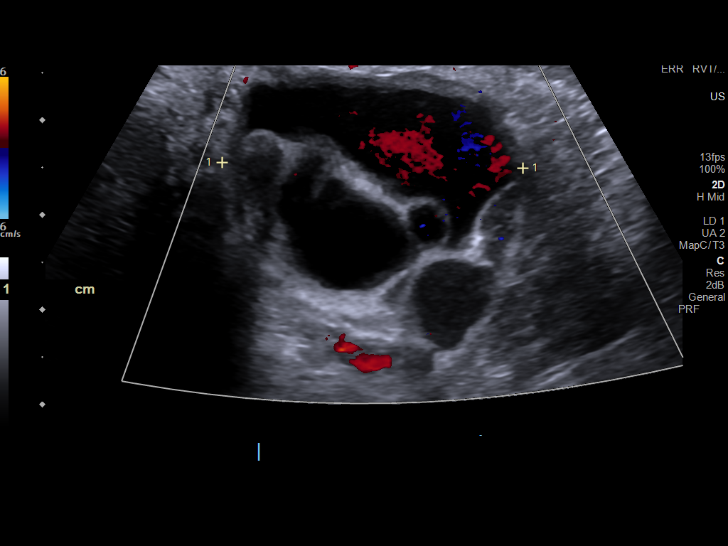

[13 of 25 positions shown; findings below may reference images not displayed]

FINDINGS: Right testicle

Measurements: 4.3 x 2.8 x 2.4 cm. The right testicle is
heterogeneous in appearance and somewhat hypoechoic when compared to
the right. There is no testicular mass.

Left testicle

Measurements: 4.7 x 2.9 x 3.6 cm. No mass or microlithiasis
visualized.

Right epididymis:  There is a 6 mm right-sided epididymal head cyst.

Left epididymis: There is a septated epididymal head cyst. This
measures approximately 3.4 x 3.2 cm.

Hydrocele:  There is a small left-sided hydrocele.

Varicocele:  None visualized.

Pulsed Doppler interrogation of both testes demonstrates
demonstrates normal arterial waveforms bilaterally. However, clear
venous waveforms were difficult to obtain bilaterally.

There is asymmetry of the scrotal wall with the scrotal wall
thickness greatest on the right.
IMPRESSION: 1. No definite evidence for testicular torsion.  No testicular mass.
2. There is a small left-sided hydrocele in addition to a septated
left-sided epididymal head cyst.
3. By the right testicle is hypoechoic when compared to the left.
This is of unknown clinical significance and may be secondary to a
prior infectious or inflammatory insult.

## 2021-04-17 DIAGNOSIS — I440 Atrioventricular block, first degree: Secondary | ICD-10-CM | POA: Diagnosis not present

## 2021-04-17 DIAGNOSIS — I4510 Unspecified right bundle-branch block: Secondary | ICD-10-CM | POA: Diagnosis not present

## 2021-04-17 DIAGNOSIS — M47814 Spondylosis without myelopathy or radiculopathy, thoracic region: Secondary | ICD-10-CM | POA: Diagnosis not present

## 2021-04-17 DIAGNOSIS — R001 Bradycardia, unspecified: Secondary | ICD-10-CM | POA: Diagnosis not present

## 2021-04-17 DIAGNOSIS — R110 Nausea: Secondary | ICD-10-CM | POA: Diagnosis not present

## 2021-04-17 DIAGNOSIS — F0390 Unspecified dementia without behavioral disturbance: Secondary | ICD-10-CM | POA: Diagnosis not present

## 2021-04-17 DIAGNOSIS — I700 Atherosclerosis of aorta: Secondary | ICD-10-CM | POA: Diagnosis not present

## 2021-04-17 DIAGNOSIS — I2510 Atherosclerotic heart disease of native coronary artery without angina pectoris: Secondary | ICD-10-CM | POA: Diagnosis not present

## 2021-04-17 DIAGNOSIS — R1030 Lower abdominal pain, unspecified: Secondary | ICD-10-CM | POA: Diagnosis not present

## 2021-04-17 DIAGNOSIS — M47816 Spondylosis without myelopathy or radiculopathy, lumbar region: Secondary | ICD-10-CM | POA: Diagnosis not present

## 2021-04-17 DIAGNOSIS — R1084 Generalized abdominal pain: Secondary | ICD-10-CM | POA: Diagnosis not present

## 2021-04-17 DIAGNOSIS — M47815 Spondylosis without myelopathy or radiculopathy, thoracolumbar region: Secondary | ICD-10-CM | POA: Diagnosis not present

## 2021-04-19 ENCOUNTER — Ambulatory Visit: Payer: Medicare PPO | Admitting: Urology

## 2021-04-19 VITALS — BP 131/68 | HR 60

## 2021-04-19 DIAGNOSIS — N4341 Spermatocele of epididymis, single: Secondary | ICD-10-CM | POA: Diagnosis not present

## 2021-04-19 DIAGNOSIS — N503 Cyst of epididymis: Secondary | ICD-10-CM | POA: Diagnosis not present

## 2021-04-19 NOTE — Progress Notes (Signed)
?Subjective: ? ?1. Spermatocele of epididymis, single   ?2. Epididymal cyst   ? ?04/19/21: Buddy returns today in f/u for his history of a left spermatocele.   He has had no pain or problems with it.  He is voiding well with an IPSS of 11.  HE has nocturia x 3.  His UA is clear.  ? ? ?04/27/20: Mr. Groh returns today in f/u with a repeat scrotal US which shows a stable complex left spermatocele.  He has no pain.  He is voiding well with an IPSS of 3. His UA is clear today.  ? ?01/27/20: Mr. Gravley is sent back in consultation for the finding of mild left hydronephrosis on recent CT imaging.  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   More worrisome was some nodularity within a left hydrocele which was partially visualized.   He is having some low back pain but no scrotal or flank pain.  He is voiding ok with an IPSS of 5.   He had a scrotal US today that showed some right testicular atrophy and a 3.4cm septated cyst of the head of the epididymis and a small hydrocele.    ? IPSS   ? ? [place] Name 04/19/21 1300  ?  ?  ?  ? International Prostate Symptom Score  ? How often have you had the sensation of not emptying your bladder? Not at All    ? How often have you had to urinate less than every two hours? Less than half the time    ? How often have you found you stopped and started again several times when you urinated? About half the time    ? How often have you found it difficult to postpone urination? Not at All    ? How often have you had a weak urinary stream? About half the time    ? How often have you had to strain to start urination? Not at All    ? How many times did you typically get up at night to urinate? 3 Times    ? Total IPSS Score 11    ?  ? Quality of Life due to urinary symptoms  ? If you were to spend the rest of your life with your urinary condition just the way it is now how would you feel about that? Mostly  Satisfied    ? ?  ?  ? ?  ?  ? ?ROS: ? ?ROS:  ?A complete review of systems was performed.  All systems are negative except for pertinent findings as noted.  ? ?ROS ? ?No Known Allergies ? ?Outpatient Encounter Medications as of 04/19/2021  ?Medication Sig  ? aspirin EC 81 MG tablet Take 81 mg by mouth daily.  ? donepezil (ARICEPT) 10 MG tablet Take 10 mg by mouth at bedtime.  ? ipratropium (ATROVENT) 0.06 % nasal spray   ? levothyroxine (SYNTHROID, LEVOTHROID) 112 MCG tablet Take 112 mcg by mouth daily before breakfast.   ? LORazepam (ATIVAN) 0.5 MG tablet Take by mouth.  ? memantine (NAMENDA XR) 28 MG CP24 24 hr capsule Take by mouth.  ? MEMANTINE HCL ER PO Take 14 mg by mouth daily.  ? metoprolol succinate (TOPROL-XL) 50 MG 24 hr tablet Take 50 mg by mouth daily. Take with or immediately following a meal.  ? nitroGLYCERIN (NITROSTAT) 0.4 MG SL tablet Place 1  tablet (0.4 mg total) under the tongue every 5 (five) minutes as needed. Up to 3 doses.  ? ramipril (ALTACE) 10 MG capsule Take 10 mg by mouth daily.  ? simvastatin (ZOCOR) 40 MG tablet Take 40 mg by mouth daily.  ? triamcinolone (KENALOG) 0.1 %   ? ?No facility-administered encounter medications on file as of 04/19/2021.  ? ? ?Past Medical History:  ?Diagnosis Date  ? Coronary atherosclerosis of native coronary artery   ? DES LAD and DES x3 RCA 2005, LVEF 60%   ? Essential hypertension   ? Hypothyroidism   ? Mixed hyperlipidemia   ? Myocardial infarction [facility])   ? 1989 (Streptokinase) and 2005  ? ? ?Past Surgical History:  ?Procedure Laterality Date  ? CHOLECYSTECTOMY    ? COLONOSCOPY N/A 01/18/2015  ? Procedure: COLONOSCOPY;  Surgeon: Rogene Houston, MD;  [facility];  Service: Endoscopy;  Laterality: N/A;  730  ? CORONARY ANGIOPLASTY    ? Stents  ? TONSILLECTOMY    ? ? ?Social History  ? ?Socioeconomic History  ? Marital status: Married  ?  Spouse name: Not on file  ? Number of children: Not on file  ? Years of education: Not on file  ? Highest  education level: Not on file  ?Occupational History  ? Occupation: Pharmacist, hospital at [employer]  ?  Comment: Retired  ?Tobacco Use  ? Smoking status: Former  ?  Packs/day: 0.80  ?  Years: 30.00  ?  Pack years: 24.00  ?  Types: Cigarettes  ?  Start date: 09/08/1957  ?  Quit date: 01/05/1988  ?  Years since quitting: 33.3  ? Smokeless tobacco: Former  ?  Types: Chew  ? Tobacco comments:  ?  chewed a little  ?Vaping Use  ? Vaping Use: Never used  ?Substance and Sexual Activity  ? Alcohol use: Yes  ?  Comment: Occasional  ? Drug use: No  ? Sexual activity: Not on file  ?Other Topics Concern  ? Not on file  ?Social History Narrative  ? Not on file  ? ?Social Determinants of Health  ? ?Financial Resource Strain: Not on file  ?Food Insecurity: Not on file  ?Transportation Needs: Not on file  ?Physical Activity: Not on file  ?Stress: Not on file  ?Social Connections: Not on file  ?Intimate Partner Violence: Not on file  ? ? ?Family History  ?Problem Relation Age of Onset  ? Coronary artery disease Other   ? Thyroid disease Father   ? Hypertension Father   ? ? ? ? ? ?Objective: ?Vitals:  ? 04/19/21 1311  ?BP: 131/68  ?Pulse: 60  ? ? ? ? ?Physical Exam ?Genitourinary: ?   Comments: He has a small soft spermatocele above the left testicle and it seems smaller than before.  Remainder of GU exam is normal.  ? ? ?Lab Results:  ?PSA ?No results found for: PSA ?No results found for: TESTOSTERONE ? ?UA is unremarkable today. ? ?Studies/Results: ?CLINICAL DATA:  Epididymal cyst follow-up ?  ?EXAM: ?SCROTAL ULTRASOUND ?  ?DOPPLER ULTRASOUND OF THE TESTICLES ?  ?TECHNIQUE: ?Complete ultrasound examination of the testicles, epididymis, and ?other scrotal structures was performed. Color and spectral Doppler ?ultrasound were also utilized to evaluate blood flow to the ?testicles. ?  ?COMPARISON:  None. ?  ?FINDINGS: ?Right testicle ?  ?Measurements: 4.8 x 1.8 x 2.6 cm. There is no testicular mass. The ?testicle is heterogeneous in  appearance. The testicle is hypoechoic ?when  compared to the right and demonstrates overall decreased color ?Doppler flow ?  ?Left testicle ?  ?Measurements: 5 x 2.4 x 3.4 cm. There is a prominent rete testes. ?There is no testicular mass. ?  ?Right epididymis:  There is a epididymal head cyst measuring 6 mm ?  ?Left epididymis: Again noted is a complex cystic mass involving the ?left epididymis. This measures approximately 2.4 x 1.8 x 1.9 cm and ?a appears to contain internal septations and debris. ?  ?Hydrocele:  There is small bilateral hydroceles. ?  ?Varicocele:  None visualized. ?  ?Pulsed Doppler interrogation of both testes demonstrates normal ?arterial waveforms bilaterally. A venous waveform is again difficult ?to obtain involving the right testicle. ?  ?IMPRESSION: ?1. Relatively stable complex cystic structure involving the left ?epididymis. This is favored to represent a complex spermatocele. ?2. Persistent abnormal appearance of the right testicle with ?persistent difficulty in obtaining a venous waveform. This is felt ?to be secondary to a prior infectious or inflammatory insult. ?Partial torsion is a consideration but is felt to be less likely in ?the absence of pain. ?3. No testicular mass. ?  ?  ?Electronically Signed ?  By: Constance Holster M.D. ?  On: 04/23/2020 19:12 ? ?Results for orders placed or performed in visit on 04/19/21 (from the past 24 hour(s))  ?Urinalysis, Routine w reflex microscopic     Status: Abnormal  ? Collection Time: 04/19/21  1:47 PM  ?Result Value Ref Range  ? Specific Gravity, UA 1.025 1.005 - 1.030  ? pH, UA 5.5 5.0 - 7.5  ? Color, UA Amber (A) Yellow  ? Appearance Ur Clear Clear  ? Leukocytes,UA Negative Negative  ? Protein,UA Negative Negative/Trace  ? Glucose, UA Negative Negative  ? Ketones, UA Trace (A) Negative  ? RBC, UA Negative Negative  ? Bilirubin, UA Negative Negative  ? Urobilinogen, Ur 0.2 0.2 - 1.0 mg/dL  ? Nitrite, UA Negative Negative  ? Microscopic  Examination Comment   ? Narrative  ? Performed at:  [facility] ?[address] [place], [place]  [id number] ?Lab Director: [place], Phone:  [phone]  ? ? ? ?Assessment & Plan: ?1.

## 2021-04-20 ENCOUNTER — Encounter: Payer: Self-pay | Admitting: Urology

## 2021-04-20 DIAGNOSIS — Z299 Encounter for prophylactic measures, unspecified: Secondary | ICD-10-CM | POA: Diagnosis not present

## 2021-04-20 DIAGNOSIS — I700 Atherosclerosis of aorta: Secondary | ICD-10-CM | POA: Diagnosis not present

## 2021-04-20 DIAGNOSIS — Z6829 Body mass index (BMI) 29.0-29.9, adult: Secondary | ICD-10-CM | POA: Diagnosis not present

## 2021-04-20 DIAGNOSIS — I10 Essential (primary) hypertension: Secondary | ICD-10-CM | POA: Diagnosis not present

## 2021-04-20 DIAGNOSIS — G309 Alzheimer's disease, unspecified: Secondary | ICD-10-CM | POA: Diagnosis not present

## 2021-04-20 DIAGNOSIS — R739 Hyperglycemia, unspecified: Secondary | ICD-10-CM | POA: Diagnosis not present

## 2021-04-20 LAB — URINALYSIS, ROUTINE W REFLEX MICROSCOPIC
Bilirubin, UA: NEGATIVE
Glucose, UA: NEGATIVE
Leukocytes,UA: NEGATIVE
Nitrite, UA: NEGATIVE
Protein,UA: NEGATIVE
RBC, UA: NEGATIVE
Specific Gravity, UA: 1.025 (ref 1.005–1.030)
Urobilinogen, Ur: 0.2 mg/dL (ref 0.2–1.0)
pH, UA: 5.5 (ref 5.0–7.5)

## 2021-04-23 NOTE — Progress Notes (Signed)
? ? ?Cardiology Office Note ? ?Date: 04/24/2021  ? ?ID: Cole Mayer, DOB [birth year], MRN [id number] ? ?PCP:  Monico Blitz, MD  ?Cardiologist:  Rozann Lesches, MD ?Electrophysiologist:  None  ? ?Chief Complaint  ?Patient presents with  ? Cardiac follow-up  ? ? ?History of Present Illness: ?Cole Mayer is an 86 y.o. male last seen in April 2022.  He is here today with his wife for a follow-up visit.  He does not report any obvious angina symptoms or nitroglycerin use with typical activities.  He remains on a stable cardiac regimen as outlined below.  He is also on Aricept and Namenda for treatment of progressive dementia.  Functional with ADLs, does need some assistance.  He did misplace his most recent nitroglycerin bottle refill. ? ?He is still following with Dr. Manuella Ghazi.  I reviewed his most recent lab work as noted below.  Last ischemic testing was in 2019, we continue observation in the absence of obvious recurrent angina. ? ?Recently reviewed his ECG today which shows sinus bradycardia with prolonged PR interval, incomplete right lower branch block, nonspecific ST changes. ? ?Past Medical History:  ?Diagnosis Date  ? Coronary atherosclerosis of native coronary artery   ? DES LAD and DES x3 RCA 2005, LVEF 60%   ? Dementia ([place])   ? Essential hypertension   ? Hypothyroidism   ? Mixed hyperlipidemia   ? Myocardial infarction [facility])   ? 1989 (Streptokinase) and 2005  ? ? ?Past Surgical History:  ?Procedure Laterality Date  ? CHOLECYSTECTOMY    ? COLONOSCOPY N/A 01/18/2015  ? Procedure: COLONOSCOPY;  Surgeon: Rogene Houston, MD;  Location: [facility];  Service: Endoscopy;  Laterality: N/A;  730  ? CORONARY ANGIOPLASTY    ? Stents  ? TONSILLECTOMY    ? ? ?Current Outpatient Medications  ?Medication Sig Dispense Refill  ? aspirin EC 81 MG tablet Take 81 mg by mouth daily.    ? donepezil (ARICEPT) 10 MG tablet Take 10 mg by mouth at bedtime.    ? ipratropium (ATROVENT) 0.06 % nasal spray     ? levothyroxine  (SYNTHROID, LEVOTHROID) 112 MCG tablet Take 112 mcg by mouth daily before breakfast.     ? LORazepam (ATIVAN) 0.5 MG tablet Take 0.5 mg by mouth.    ? memantine (NAMENDA XR) 28 MG CP24 24 hr capsule Take by mouth.    ? MEMANTINE HCL ER PO Take 14 mg by mouth daily.    ? metoprolol succinate (TOPROL-XL) 50 MG 24 hr tablet Take 50 mg by mouth daily. Take with or immediately following a meal.    ? ramipril (ALTACE) 10 MG capsule Take 10 mg by mouth daily.    ? simvastatin (ZOCOR) 40 MG tablet Take 40 mg by mouth daily.    ? triamcinolone (KENALOG) 0.1 %     ? nitroGLYCERIN (NITROSTAT) 0.4 MG SL tablet Place 1 tablet (0.4 mg total) under the tongue every 5 (five) minutes x 3 doses as needed for chest pain (if no relief after 2nd dose, proceed to ED or call 911). Up to 3 doses. 25 tablet 3  ? ?No current facility-administered medications for this visit.  ? ?Allergies:  Patient has no known allergies.  ? ?ROS: No orthopnea or PND.  No syncope. ? ?Physical Exam: ?VS:  BP 124/64   Pulse (!) 53   Ht 6' (1.829 m)   Wt 208 lb 12.8 oz (94.7 kg)   SpO2 98%   BMI 28.32  kg/m? , BMI Body mass index is 28.32 kg/m?. ? ?Wt Readings from Last 3 Encounters:  ?04/24/21 208 lb 12.8 oz (94.7 kg)  ?04/27/20 216 lb (98 kg)  ?04/24/20 216 lb (98 kg)  ?  ?General: Patient appears comfortable at rest. ?HEENT: Conjunctiva and lids normal. ?Neck: Supple, no elevated JVP or carotid bruits, no thyromegaly. ?Lungs: Clear to auscultation, nonlabored breathing at rest. ?Cardiac: Regular rate and rhythm, no S3, 1/6 systolic murmur, no pericardial rub. ?Abdomen: Soft, bowel sounds present. ?Extremities: Venous stasis. ? ?ECG:  An ECG dated 04/24/2020 was personally reviewed today and demonstrated:  Sinus rhythm with prolonged PR interval, increased voltage with incomplete right bundle branch block and old inferior infarct pattern. ? ?Recent Labwork: ? ?March 2023: Hemoglobin 15.0, platelets 207, potassium 4.4, BUN 29, creatinine 1.39, AST 19, ALT  20 ? ?Other Studies Reviewed Today: ? ?Lexiscan Myoview 03/18/2017: ?T wave inversion was noted during stress in the V3, V4 and III leads. ?Defect 1: There is a medium defect of moderate severity present in the mid inferior, mid inferolateral, apical inferior and apical lateral location. ?Findings may be consistent with prior myocardial infarction with a very mild degree of peri-infarct ischemia. However, variable soft tissue attenuation is also likely contributing to this defect. ?Nuclear stress EF: 64%. ?Overall, this is a low risk study. ? ?Assessment and Plan: ? ?1.  CAD status post DES to the LAD and RCA in 2005.  We continue observation on medical therapy in the absence of recurrent angina symptoms.  Last ischemic testing was in 2019.  I reviewed his ECG today.  Continue aspirin, Toprol-XL, Altace, Zocor, and refill provided for fresh bottle of nitroglycerin. ? ?2.  Mixed hyperlipidemia on Zocor with continued follow-up by Dr. Manuella Ghazi. ? ?Medication Adjustments/Labs and Tests Ordered: ?Current medicines are reviewed at length with the patient today.  Concerns regarding medicines are outlined above. ? ?Tests Ordered: ?Orders Placed This Encounter  ?Procedures  ? EKG 12-Lead  ? ? ?Medication Changes: ?Meds ordered this encounter  ?Medications  ? nitroGLYCERIN (NITROSTAT) 0.4 MG SL tablet  ?  Sig: Place 1 tablet (0.4 mg total) under the tongue every 5 (five) minutes x 3 doses as needed for chest pain (if no relief after 2nd dose, proceed to ED or call 911). Up to 3 doses.  ?  Dispense:  25 tablet  ?  Refill:  3  ? ? ?Disposition:  Follow up  1 year, sooner if needed. ? ?Signed, ?Satira Sark, MD, [facility] ?04/24/2021 12:13 PM    ?[facility] ?[place], [place], [place] [address] ?Phone: [phone]; Fax: [phone]  ?

## 2021-04-24 ENCOUNTER — Ambulatory Visit: Payer: Medicare PPO | Admitting: Cardiology

## 2021-04-24 ENCOUNTER — Encounter: Payer: Self-pay | Admitting: Cardiology

## 2021-04-24 VITALS — BP 124/64 | HR 53 | Ht 72.0 in | Wt 208.8 lb

## 2021-04-24 DIAGNOSIS — E782 Mixed hyperlipidemia: Secondary | ICD-10-CM | POA: Diagnosis not present

## 2021-04-24 DIAGNOSIS — I25119 Atherosclerotic heart disease of native coronary artery with unspecified angina pectoris: Secondary | ICD-10-CM

## 2021-04-24 MED ORDER — NITROGLYCERIN 0.4 MG SL SUBL: 0.4000 mg | SUBLINGUAL_TABLET | SUBLINGUAL | 3 refills | Status: DC | PRN

## 2021-04-24 NOTE — Patient Instructions (Addendum)

## 2021-04-26 ENCOUNTER — Ambulatory Visit: Payer: Medicare PPO | Admitting: Urology

## 2021-05-21 DIAGNOSIS — Z299 Encounter for prophylactic measures, unspecified: Secondary | ICD-10-CM | POA: Diagnosis not present

## 2021-05-21 DIAGNOSIS — Z6829 Body mass index (BMI) 29.0-29.9, adult: Secondary | ICD-10-CM | POA: Diagnosis not present

## 2021-05-21 DIAGNOSIS — I10 Essential (primary) hypertension: Secondary | ICD-10-CM | POA: Diagnosis not present

## 2021-05-21 DIAGNOSIS — Z713 Dietary counseling and surveillance: Secondary | ICD-10-CM | POA: Diagnosis not present

## 2021-05-21 DIAGNOSIS — J309 Allergic rhinitis, unspecified: Secondary | ICD-10-CM | POA: Diagnosis not present

## 2021-06-07 DIAGNOSIS — C4442 Squamous cell carcinoma of skin of scalp and neck: Secondary | ICD-10-CM | POA: Diagnosis not present

## 2021-06-07 DIAGNOSIS — Z08 Encounter for follow-up examination after completed treatment for malignant neoplasm: Secondary | ICD-10-CM | POA: Diagnosis not present

## 2021-06-07 DIAGNOSIS — Z85828 Personal history of other malignant neoplasm of skin: Secondary | ICD-10-CM | POA: Diagnosis not present

## 2021-06-29 IMAGING — US US SCROTUM W/ DOPPLER COMPLETE
1 series · 13 of 25 positions shown · non-contrast
Comparison: None.

CLINICAL DATA: Epididymal cyst follow-up

EXAM:
SCROTAL ULTRASOUND
DOPPLER ULTRASOUND OF THE TESTICLES
TECHNIQUE: Complete ultrasound examination of the testicles, epididymis, and
other scrotal structures was performed. Color and spectral Doppler
ultrasound were also utilized to evaluate blood flow to the
testicles.

[Series 1: us scrotum w/doppler · 13 of 89 slices shown]
[im 1/89]
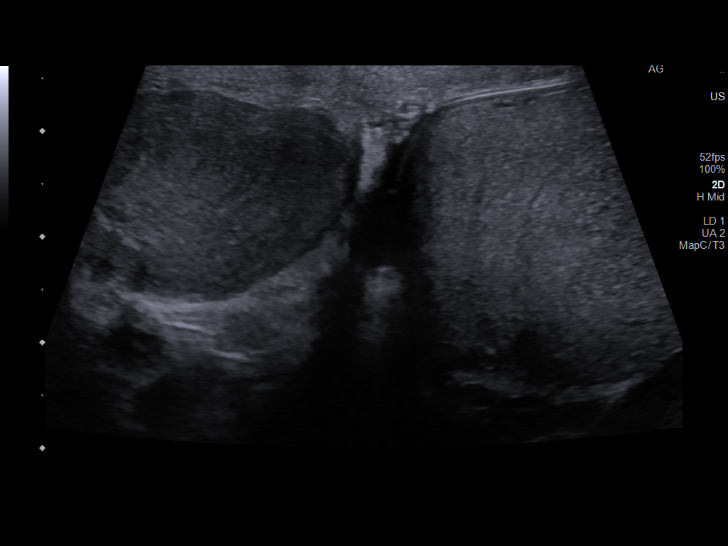
[im 8/89]
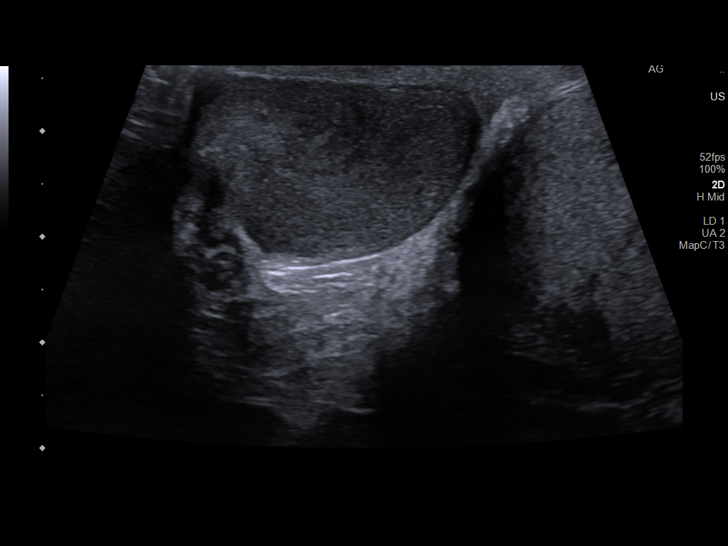
[im 15/89]
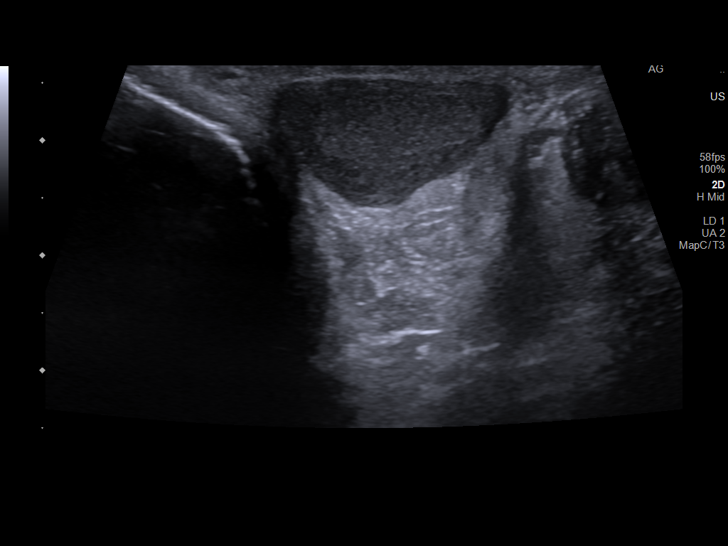
[im 23/89]
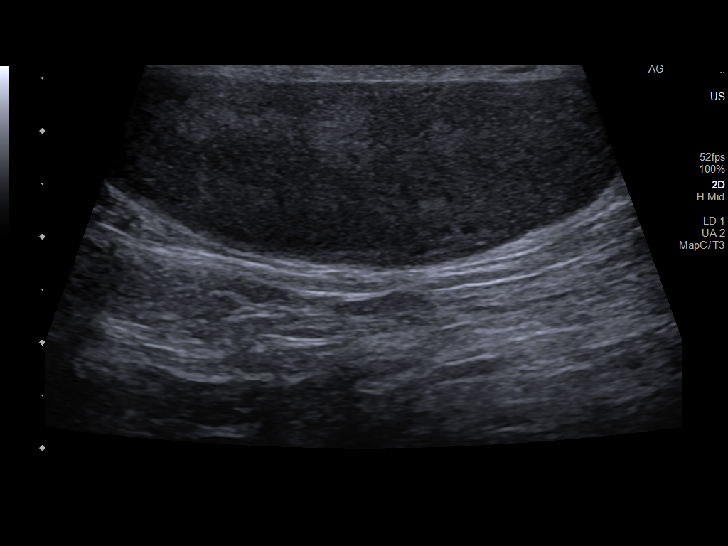
[im 30/89]
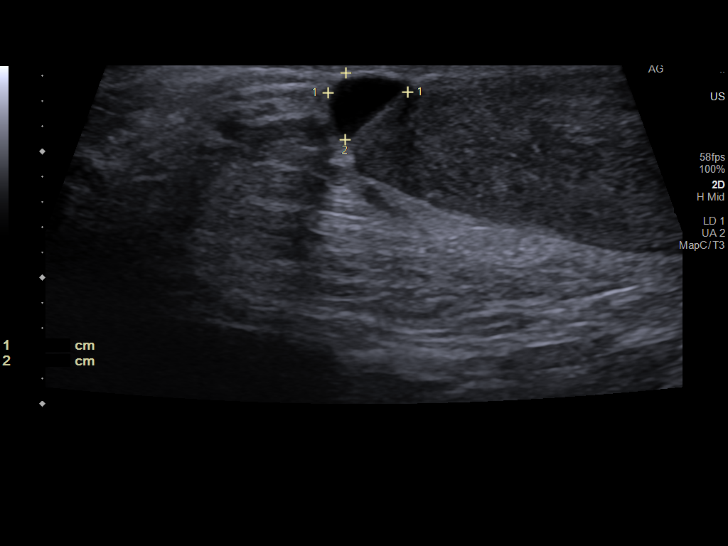
[im 37/89]
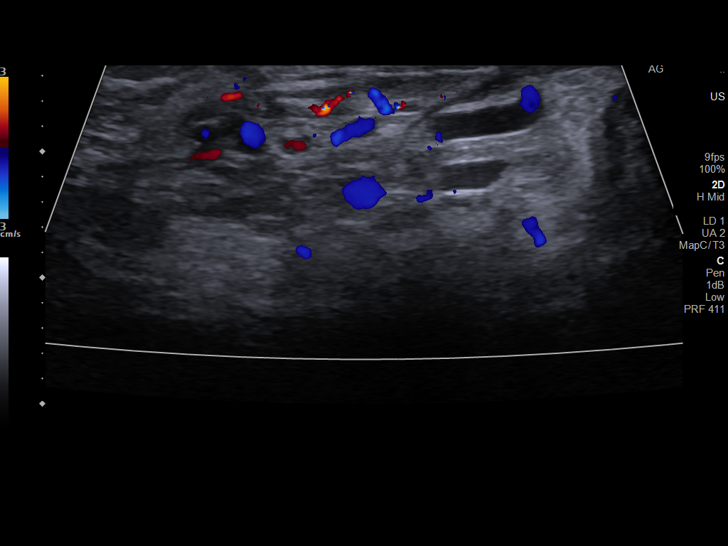
[im 45/89]
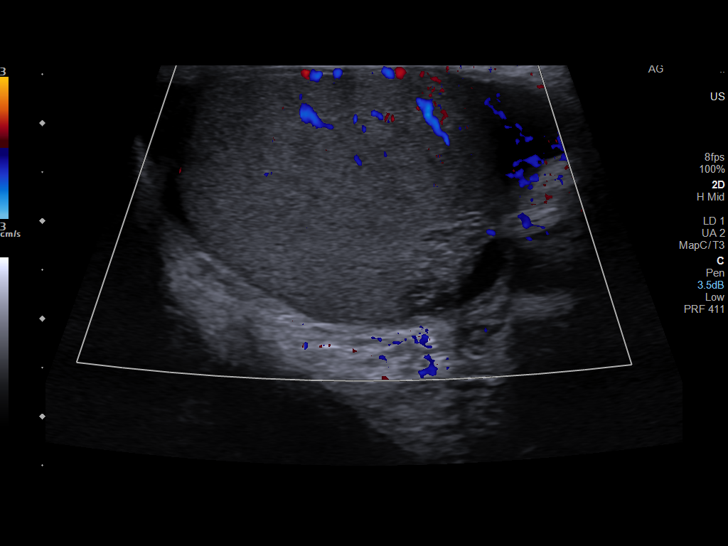
[im 52/89]
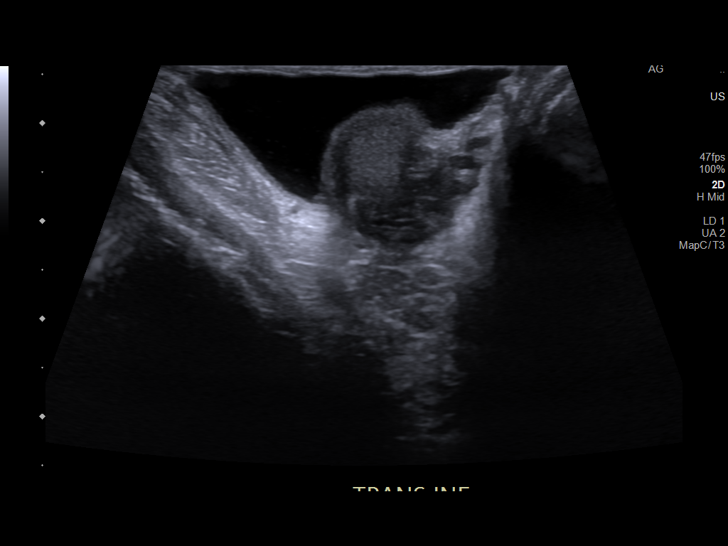
[im 59/89]
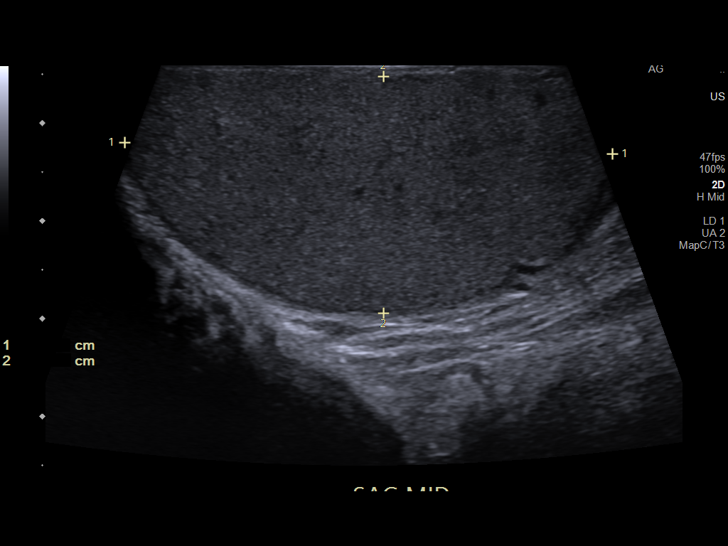
[im 67/89]
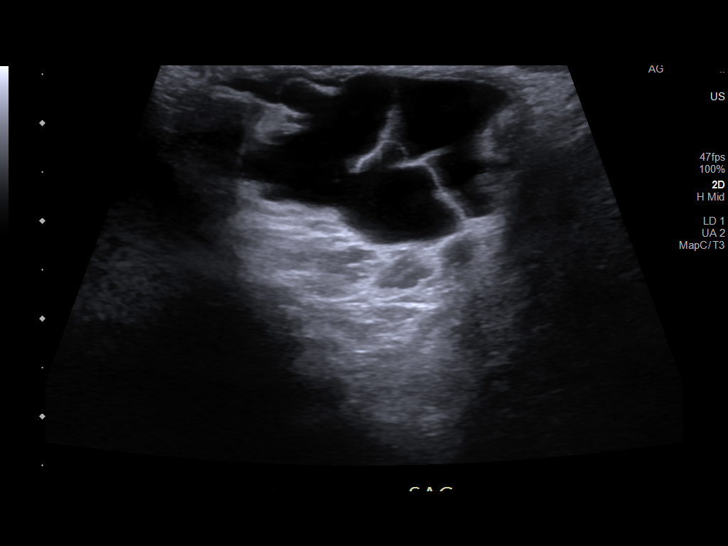
[im 74/89]
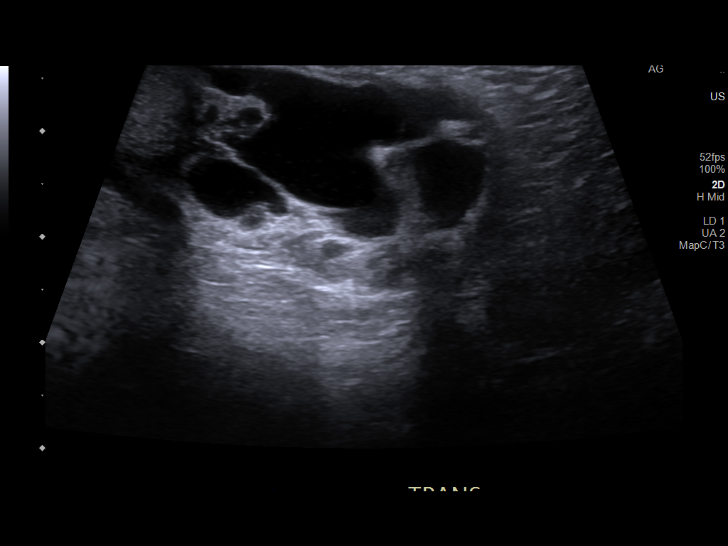
[im 81/89]
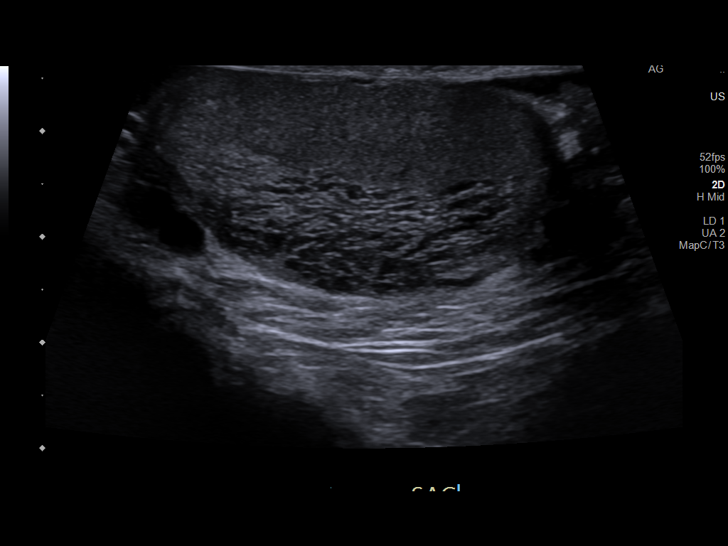
[im 89/89]
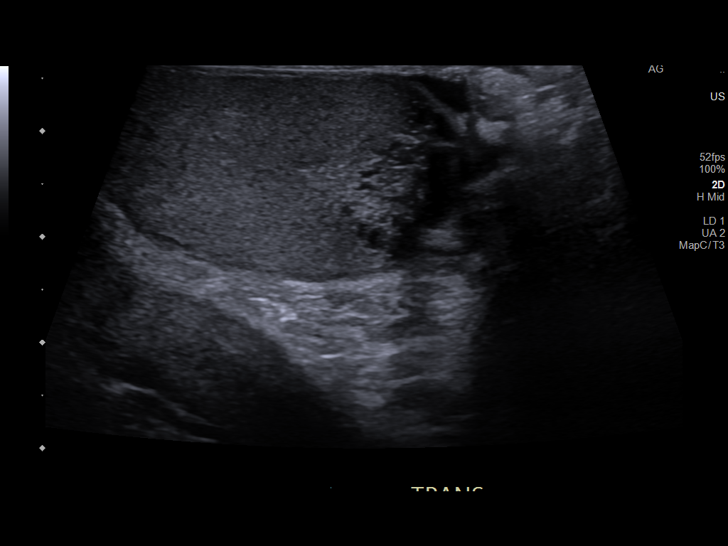

[13 of 25 positions shown; findings below may reference images not displayed]

FINDINGS: Right testicle

Measurements: 4.8 x 1.8 x 2.6 cm. There is no testicular mass. The
testicle is heterogeneous in appearance. The testicle is hypoechoic
when compared to the right and demonstrates overall decreased color
Doppler flow

Left testicle

Measurements: 5 x 2.4 x 3.4 cm. There is a prominent rete testes.
There is no testicular mass.

Right epididymis:  There is a epididymal head cyst measuring 6 mm

Left epididymis: Again noted is a complex cystic mass involving the
left epididymis. This measures approximately 2.4 x 1.8 x 1.9 cm and
a appears to contain internal septations and debris.

Hydrocele:  There is small bilateral hydroceles.

Varicocele:  None visualized.

Pulsed Doppler interrogation of both testes demonstrates normal
arterial waveforms bilaterally. A venous waveform is again difficult
to obtain involving the right testicle.
IMPRESSION: 1. Relatively stable complex cystic structure involving the left
epididymis. This is favored to represent a complex spermatocele.
2. Persistent abnormal appearance of the right testicle with
persistent difficulty in obtaining a venous waveform. This is felt
to be secondary to a prior infectious or inflammatory insult.
Partial torsion is a consideration but is felt to be less likely in
the absence of pain.
3. No testicular mass.

## 2021-07-04 DIAGNOSIS — R059 Cough, unspecified: Secondary | ICD-10-CM | POA: Diagnosis not present

## 2021-07-04 DIAGNOSIS — I10 Essential (primary) hypertension: Secondary | ICD-10-CM | POA: Diagnosis not present

## 2021-07-04 DIAGNOSIS — R350 Frequency of micturition: Secondary | ICD-10-CM | POA: Diagnosis not present

## 2021-07-04 DIAGNOSIS — Z299 Encounter for prophylactic measures, unspecified: Secondary | ICD-10-CM | POA: Diagnosis not present

## 2021-07-04 DIAGNOSIS — G309 Alzheimer's disease, unspecified: Secondary | ICD-10-CM | POA: Diagnosis not present

## 2021-07-12 DIAGNOSIS — Z85828 Personal history of other malignant neoplasm of skin: Secondary | ICD-10-CM | POA: Diagnosis not present

## 2021-07-12 DIAGNOSIS — Z08 Encounter for follow-up examination after completed treatment for malignant neoplasm: Secondary | ICD-10-CM | POA: Diagnosis not present

## 2021-07-17 ENCOUNTER — Emergency Department (HOSPITAL_COMMUNITY): Payer: Medicare PPO

## 2021-07-17 ENCOUNTER — Inpatient Hospital Stay (HOSPITAL_COMMUNITY)
Admission: EM | Admit: 2021-07-17 | Discharge: 2021-07-19 | DRG: 640 | Disposition: A | Discharge status: Home / Self Care | Payer: Medicare PPO | Attending: Internal Medicine | Admitting: Internal Medicine

## 2021-07-17 ENCOUNTER — Encounter (HOSPITAL_COMMUNITY): Payer: Self-pay | Admitting: *Deleted

## 2021-07-17 ENCOUNTER — Observation Stay (HOSPITAL_COMMUNITY): Payer: Medicare PPO

## 2021-07-17 DIAGNOSIS — M47812 Spondylosis without myelopathy or radiculopathy, cervical region: Secondary | ICD-10-CM | POA: Diagnosis not present

## 2021-07-17 DIAGNOSIS — Z79890 Hormone replacement therapy (postmenopausal): Secondary | ICD-10-CM

## 2021-07-17 DIAGNOSIS — S40012A Contusion of left shoulder, initial encounter: Secondary | ICD-10-CM | POA: Diagnosis present

## 2021-07-17 DIAGNOSIS — E782 Mixed hyperlipidemia: Secondary | ICD-10-CM | POA: Diagnosis present

## 2021-07-17 DIAGNOSIS — I959 Hypotension, unspecified: Secondary | ICD-10-CM | POA: Diagnosis not present

## 2021-07-17 DIAGNOSIS — S41111A Laceration without foreign body of right upper arm, initial encounter: Secondary | ICD-10-CM | POA: Diagnosis present

## 2021-07-17 DIAGNOSIS — Z7982 Long term (current) use of aspirin: Secondary | ICD-10-CM

## 2021-07-17 DIAGNOSIS — I10 Essential (primary) hypertension: Secondary | ICD-10-CM | POA: Diagnosis present

## 2021-07-17 DIAGNOSIS — S199XXA Unspecified injury of neck, initial encounter: Secondary | ICD-10-CM | POA: Diagnosis not present

## 2021-07-17 DIAGNOSIS — X58XXXA Exposure to other specified factors, initial encounter: Secondary | ICD-10-CM | POA: Diagnosis present

## 2021-07-17 DIAGNOSIS — R0689 Other abnormalities of breathing: Secondary | ICD-10-CM | POA: Diagnosis not present

## 2021-07-17 DIAGNOSIS — F0392 Unspecified dementia, unspecified severity, with psychotic disturbance: Secondary | ICD-10-CM | POA: Diagnosis present

## 2021-07-17 DIAGNOSIS — E876 Hypokalemia: Secondary | ICD-10-CM | POA: Diagnosis present

## 2021-07-17 DIAGNOSIS — R7989 Other specified abnormal findings of blood chemistry: Secondary | ICD-10-CM

## 2021-07-17 DIAGNOSIS — E039 Hypothyroidism, unspecified: Secondary | ICD-10-CM | POA: Diagnosis present

## 2021-07-17 DIAGNOSIS — R9431 Abnormal electrocardiogram [ECG] [EKG]: Secondary | ICD-10-CM | POA: Diagnosis not present

## 2021-07-17 DIAGNOSIS — I248 Other forms of acute ischemic heart disease: Secondary | ICD-10-CM | POA: Diagnosis present

## 2021-07-17 DIAGNOSIS — I252 Old myocardial infarction: Secondary | ICD-10-CM | POA: Diagnosis not present

## 2021-07-17 DIAGNOSIS — R609 Edema, unspecified: Secondary | ICD-10-CM | POA: Diagnosis not present

## 2021-07-17 DIAGNOSIS — Z7189 Other specified counseling: Secondary | ICD-10-CM | POA: Diagnosis not present

## 2021-07-17 DIAGNOSIS — S41112A Laceration without foreign body of left upper arm, initial encounter: Secondary | ICD-10-CM | POA: Diagnosis present

## 2021-07-17 DIAGNOSIS — R778 Other specified abnormalities of plasma proteins: Secondary | ICD-10-CM | POA: Diagnosis not present

## 2021-07-17 DIAGNOSIS — I440 Atrioventricular block, first degree: Secondary | ICD-10-CM | POA: Diagnosis present

## 2021-07-17 DIAGNOSIS — R4182 Altered mental status, unspecified: Principal | ICD-10-CM

## 2021-07-17 DIAGNOSIS — Z9183 Wandering in diseases classified elsewhere: Secondary | ICD-10-CM

## 2021-07-17 DIAGNOSIS — G9340 Encephalopathy, unspecified: Secondary | ICD-10-CM | POA: Diagnosis not present

## 2021-07-17 DIAGNOSIS — T679XXA Effect of heat and light, unspecified, initial encounter: Secondary | ICD-10-CM | POA: Diagnosis not present

## 2021-07-17 DIAGNOSIS — N179 Acute kidney failure, unspecified: Secondary | ICD-10-CM | POA: Diagnosis not present

## 2021-07-17 DIAGNOSIS — E860 Dehydration: Secondary | ICD-10-CM | POA: Diagnosis not present

## 2021-07-17 DIAGNOSIS — Z8249 Family history of ischemic heart disease and other diseases of the circulatory system: Secondary | ICD-10-CM | POA: Diagnosis not present

## 2021-07-17 DIAGNOSIS — Z8349 Family history of other endocrine, nutritional and metabolic diseases: Secondary | ICD-10-CM | POA: Diagnosis not present

## 2021-07-17 DIAGNOSIS — R9082 White matter disease, unspecified: Secondary | ICD-10-CM | POA: Diagnosis not present

## 2021-07-17 DIAGNOSIS — I6523 Occlusion and stenosis of bilateral carotid arteries: Secondary | ICD-10-CM | POA: Diagnosis not present

## 2021-07-17 DIAGNOSIS — S0101XA Laceration without foreign body of scalp, initial encounter: Secondary | ICD-10-CM | POA: Diagnosis present

## 2021-07-17 DIAGNOSIS — S4992XA Unspecified injury of left shoulder and upper arm, initial encounter: Secondary | ICD-10-CM | POA: Diagnosis not present

## 2021-07-17 DIAGNOSIS — Z515 Encounter for palliative care: Secondary | ICD-10-CM

## 2021-07-17 DIAGNOSIS — F0390 Unspecified dementia without behavioral disturbance: Secondary | ICD-10-CM | POA: Diagnosis not present

## 2021-07-17 DIAGNOSIS — Z87891 Personal history of nicotine dependence: Secondary | ICD-10-CM

## 2021-07-17 DIAGNOSIS — Y929 Unspecified place or not applicable: Secondary | ICD-10-CM

## 2021-07-17 DIAGNOSIS — I2510 Atherosclerotic heart disease of native coronary artery without angina pectoris: Secondary | ICD-10-CM | POA: Diagnosis present

## 2021-07-17 DIAGNOSIS — S0081XA Abrasion of other part of head, initial encounter: Secondary | ICD-10-CM | POA: Diagnosis not present

## 2021-07-17 DIAGNOSIS — G9389 Other specified disorders of brain: Secondary | ICD-10-CM | POA: Diagnosis present

## 2021-07-17 DIAGNOSIS — R001 Bradycardia, unspecified: Secondary | ICD-10-CM | POA: Diagnosis present

## 2021-07-17 DIAGNOSIS — Z79899 Other long term (current) drug therapy: Secondary | ICD-10-CM

## 2021-07-17 DIAGNOSIS — G9341 Metabolic encephalopathy: Secondary | ICD-10-CM | POA: Diagnosis present

## 2021-07-17 DIAGNOSIS — R270 Ataxia, unspecified: Secondary | ICD-10-CM | POA: Diagnosis not present

## 2021-07-17 LAB — URINALYSIS, ROUTINE W REFLEX MICROSCOPIC
Bacteria, UA: NONE SEEN
Bilirubin Urine: NEGATIVE
Glucose, UA: NEGATIVE mg/dL
Ketones, ur: 5 mg/dL — AB
Leukocytes,Ua: NEGATIVE
Nitrite: NEGATIVE
Protein, ur: NEGATIVE mg/dL
Specific Gravity, Urine: 1.005 (ref 1.005–1.030)
pH: 6 (ref 5.0–8.0)

## 2021-07-17 LAB — COMPREHENSIVE METABOLIC PANEL
ALT: 18 U/L (ref 0–44)
AST: 24 U/L (ref 15–41)
Albumin: 3.9 g/dL (ref 3.5–5.0)
Alkaline Phosphatase: 36 U/L — ABNORMAL LOW (ref 38–126)
Anion gap: 13 (ref 5–15)
BUN: 23 mg/dL (ref 8–23)
CO2: 25 mmol/L (ref 22–32)
Calcium: 9.4 mg/dL (ref 8.9–10.3)
Chloride: 104 mmol/L (ref 98–111)
Creatinine, Ser: 1.58 mg/dL — ABNORMAL HIGH (ref 0.61–1.24)
GFR, Estimated: 43 mL/min — ABNORMAL LOW (ref >60)
Glucose, Bld: 102 mg/dL — ABNORMAL HIGH (ref 70–99)
Potassium: 2.8 mmol/L — ABNORMAL LOW (ref 3.5–5.1)
Sodium: 142 mmol/L (ref 135–145)
Total Bilirubin: 1.5 mg/dL — ABNORMAL HIGH (ref 0.3–1.2)
Total Protein: 7 g/dL (ref 6.5–8.1)

## 2021-07-17 LAB — CK: Total CK: 203 U/L (ref 49–397)

## 2021-07-17 LAB — CBC WITH DIFFERENTIAL/PLATELET
Abs Immature Granulocytes: 0.08 10*3/uL — ABNORMAL HIGH (ref 0.00–0.07)
Basophils Absolute: 0.1 10*3/uL (ref 0.0–0.1)
Basophils Relative: 1 %
Eosinophils Absolute: 0 10*3/uL (ref 0.0–0.5)
Eosinophils Relative: 0 %
HCT: 43.8 % (ref 39.0–52.0)
Hemoglobin: 14.9 g/dL (ref 13.0–17.0)
Immature Granulocytes: 1 %
Lymphocytes Relative: 12 %
Lymphs Abs: 1.4 10*3/uL (ref 0.7–4.0)
MCH: 30.2 pg (ref 26.0–34.0)
MCHC: 34 g/dL (ref 30.0–36.0)
MCV: 88.8 fL (ref 80.0–100.0)
Monocytes Absolute: 0.8 10*3/uL (ref 0.1–1.0)
Monocytes Relative: 7 %
Neutro Abs: 9.4 10*3/uL — ABNORMAL HIGH (ref 1.7–7.7)
Neutrophils Relative %: 79 %
Platelets: 220 10*3/uL (ref 150–400)
RBC: 4.93 MIL/uL (ref 4.22–5.81)
RDW: 12.2 % (ref 11.5–15.5)
WBC: 11.8 10*3/uL — ABNORMAL HIGH (ref 4.0–10.5)
nRBC: 0 % (ref 0.0–0.2)

## 2021-07-17 LAB — TROPONIN I (HIGH SENSITIVITY)
Troponin I (High Sensitivity): 57 ng/L — ABNORMAL HIGH (ref <18)
Troponin I (High Sensitivity): 76 ng/L — ABNORMAL HIGH (ref <18)
Troponin I (High Sensitivity): 90 ng/L — ABNORMAL HIGH (ref <18)

## 2021-07-17 LAB — MAGNESIUM: Magnesium: 2.5 mg/dL — ABNORMAL HIGH (ref 1.7–2.4)

## 2021-07-17 MED ORDER — ACETAMINOPHEN 325 MG PO TABS: 650.0000 mg | ORAL_TABLET | Freq: Four times a day (QID) | ORAL | Status: DC | PRN

## 2021-07-17 MED ORDER — RAMIPRIL 5 MG PO CAPS
10.0000 mg | ORAL_CAPSULE | Freq: Every day | ORAL | Status: DC
  Administered 2021-07-18 – 2021-07-19 (×2): 10 mg via ORAL
  Filled 2021-07-17 (×2): qty 2

## 2021-07-17 MED ORDER — METOPROLOL SUCCINATE ER 50 MG PO TB24
50.0000 mg | ORAL_TABLET | Freq: Every day | ORAL | Status: DC
  Administered 2021-07-18: 50 mg via ORAL
  Filled 2021-07-17: qty 1

## 2021-07-17 MED ORDER — BACITRACIN ZINC 500 UNIT/GM EX OINT
TOPICAL_OINTMENT | Freq: Two times a day (BID) | CUTANEOUS | Status: DC
  Filled 2021-07-17: qty 0.9

## 2021-07-17 MED ORDER — POTASSIUM CHLORIDE 10 MEQ/100ML IV SOLN
10.0000 meq | Freq: Once | INTRAVENOUS | Status: AC
  Administered 2021-07-17: 10 meq via INTRAVENOUS
  Filled 2021-07-17: qty 100

## 2021-07-17 MED ORDER — POTASSIUM CHLORIDE IN NACL 40-0.9 MEQ/L-% IV SOLN: INTRAVENOUS | Status: AC

## 2021-07-17 MED ORDER — MAGNESIUM SULFATE 2 GM/50ML IV SOLN
2.0000 g | Freq: Once | INTRAVENOUS | Status: AC
  Administered 2021-07-17: 2 g via INTRAVENOUS
  Filled 2021-07-17: qty 50

## 2021-07-17 MED ORDER — ASPIRIN 81 MG PO TBEC
81.0000 mg | DELAYED_RELEASE_TABLET | Freq: Every day | ORAL | Status: DC
  Administered 2021-07-18 – 2021-07-19 (×2): 81 mg via ORAL
  Filled 2021-07-17 (×2): qty 1

## 2021-07-17 MED ORDER — POTASSIUM CHLORIDE 20 MEQ PO PACK
40.0000 meq | PACK | Freq: Once | ORAL | Status: AC
Start: 2021-07-17 — End: 2021-07-17
  Administered 2021-07-17: 40 meq via ORAL
  Filled 2021-07-17: qty 2

## 2021-07-17 MED ORDER — LEVOTHYROXINE SODIUM 112 MCG PO TABS
112.0000 ug | ORAL_TABLET | Freq: Every day | ORAL | Status: DC
  Administered 2021-07-18 – 2021-07-19 (×2): 112 ug via ORAL
  Filled 2021-07-17 (×2): qty 1

## 2021-07-17 MED ORDER — SODIUM CHLORIDE 0.9 % IV BOLUS
1000.0000 mL | Freq: Once | INTRAVENOUS | Status: AC
  Administered 2021-07-17: 1000 mL via INTRAVENOUS

## 2021-07-17 MED ORDER — ACETAMINOPHEN 650 MG RE SUPP: 650.0000 mg | Freq: Four times a day (QID) | RECTAL | Status: DC | PRN

## 2021-07-17 MED ORDER — DONEPEZIL HCL 5 MG PO TABS
10.0000 mg | ORAL_TABLET | Freq: Every day | ORAL | Status: DC
Start: 2021-07-17 — End: 2021-07-18
  Administered 2021-07-17: 10 mg via ORAL
  Filled 2021-07-17: qty 2

## 2021-07-17 MED ORDER — POLYETHYLENE GLYCOL 3350 17 G PO PACK: 17.0000 g | PACK | Freq: Every day | ORAL | Status: DC | PRN

## 2021-07-17 MED ORDER — ASPIRIN 81 MG PO CHEW
324.0000 mg | CHEWABLE_TABLET | Freq: Once | ORAL | Status: AC
  Administered 2021-07-17: 324 mg via ORAL
  Filled 2021-07-17: qty 4

## 2021-07-17 MED ORDER — MEMANTINE HCL ER 28 MG PO CP24
28.0000 mg | ORAL_CAPSULE | Freq: Every day | ORAL | Status: DC
  Administered 2021-07-18 – 2021-07-19 (×2): 28 mg via ORAL
  Filled 2021-07-17 (×4): qty 1

## 2021-07-17 NOTE — ED Triage Notes (Signed)
Pt brought in by ems for c/o found wandering outside for about an hour; pt arrived with redness all over body with multiple abrasions and scratches  EMS administered of NS en route

## 2021-07-18 ENCOUNTER — Other Ambulatory Visit: Payer: Self-pay

## 2021-07-18 ENCOUNTER — Encounter (HOSPITAL_COMMUNITY): Payer: Self-pay | Admitting: Internal Medicine

## 2021-07-18 DIAGNOSIS — I10 Essential (primary) hypertension: Secondary | ICD-10-CM | POA: Diagnosis present

## 2021-07-18 DIAGNOSIS — Y929 Unspecified place or not applicable: Secondary | ICD-10-CM | POA: Diagnosis not present

## 2021-07-18 DIAGNOSIS — X58XXXA Exposure to other specified factors, initial encounter: Secondary | ICD-10-CM | POA: Diagnosis present

## 2021-07-18 DIAGNOSIS — Z79899 Other long term (current) drug therapy: Secondary | ICD-10-CM | POA: Diagnosis not present

## 2021-07-18 DIAGNOSIS — I2510 Atherosclerotic heart disease of native coronary artery without angina pectoris: Secondary | ICD-10-CM | POA: Diagnosis present

## 2021-07-18 DIAGNOSIS — I252 Old myocardial infarction: Secondary | ICD-10-CM | POA: Diagnosis not present

## 2021-07-18 DIAGNOSIS — G9341 Metabolic encephalopathy: Secondary | ICD-10-CM | POA: Diagnosis present

## 2021-07-18 DIAGNOSIS — E876 Hypokalemia: Secondary | ICD-10-CM | POA: Diagnosis present

## 2021-07-18 DIAGNOSIS — Z87891 Personal history of nicotine dependence: Secondary | ICD-10-CM | POA: Diagnosis not present

## 2021-07-18 DIAGNOSIS — Z7982 Long term (current) use of aspirin: Secondary | ICD-10-CM | POA: Diagnosis not present

## 2021-07-18 DIAGNOSIS — Z79890 Hormone replacement therapy (postmenopausal): Secondary | ICD-10-CM | POA: Diagnosis not present

## 2021-07-18 DIAGNOSIS — S0101XA Laceration without foreign body of scalp, initial encounter: Secondary | ICD-10-CM | POA: Diagnosis present

## 2021-07-18 DIAGNOSIS — E039 Hypothyroidism, unspecified: Secondary | ICD-10-CM | POA: Diagnosis present

## 2021-07-18 DIAGNOSIS — S41112A Laceration without foreign body of left upper arm, initial encounter: Secondary | ICD-10-CM | POA: Diagnosis present

## 2021-07-18 DIAGNOSIS — R9431 Abnormal electrocardiogram [ECG] [EKG]: Secondary | ICD-10-CM | POA: Diagnosis not present

## 2021-07-18 DIAGNOSIS — Z8249 Family history of ischemic heart disease and other diseases of the circulatory system: Secondary | ICD-10-CM | POA: Diagnosis not present

## 2021-07-18 DIAGNOSIS — N179 Acute kidney failure, unspecified: Secondary | ICD-10-CM | POA: Diagnosis not present

## 2021-07-18 DIAGNOSIS — G9389 Other specified disorders of brain: Secondary | ICD-10-CM | POA: Diagnosis present

## 2021-07-18 DIAGNOSIS — G9340 Encephalopathy, unspecified: Secondary | ICD-10-CM | POA: Diagnosis not present

## 2021-07-18 DIAGNOSIS — R778 Other specified abnormalities of plasma proteins: Secondary | ICD-10-CM | POA: Diagnosis not present

## 2021-07-18 DIAGNOSIS — E782 Mixed hyperlipidemia: Secondary | ICD-10-CM | POA: Diagnosis present

## 2021-07-18 DIAGNOSIS — Z515 Encounter for palliative care: Secondary | ICD-10-CM | POA: Diagnosis not present

## 2021-07-18 DIAGNOSIS — I248 Other forms of acute ischemic heart disease: Secondary | ICD-10-CM | POA: Diagnosis present

## 2021-07-18 DIAGNOSIS — Z9183 Wandering in diseases classified elsewhere: Secondary | ICD-10-CM | POA: Diagnosis not present

## 2021-07-18 DIAGNOSIS — Z7189 Other specified counseling: Secondary | ICD-10-CM

## 2021-07-18 DIAGNOSIS — S40012A Contusion of left shoulder, initial encounter: Secondary | ICD-10-CM | POA: Diagnosis present

## 2021-07-18 DIAGNOSIS — S41111A Laceration without foreign body of right upper arm, initial encounter: Secondary | ICD-10-CM | POA: Diagnosis present

## 2021-07-18 DIAGNOSIS — F0390 Unspecified dementia without behavioral disturbance: Secondary | ICD-10-CM | POA: Diagnosis not present

## 2021-07-18 DIAGNOSIS — F0392 Unspecified dementia, unspecified severity, with psychotic disturbance: Secondary | ICD-10-CM | POA: Diagnosis present

## 2021-07-18 DIAGNOSIS — E860 Dehydration: Secondary | ICD-10-CM | POA: Diagnosis present

## 2021-07-18 DIAGNOSIS — Z8349 Family history of other endocrine, nutritional and metabolic diseases: Secondary | ICD-10-CM | POA: Diagnosis not present

## 2021-07-18 LAB — CBC
HCT: 39.7 % (ref 39.0–52.0)
Hemoglobin: 13.8 g/dL (ref 13.0–17.0)
MCH: 30.9 pg (ref 26.0–34.0)
MCHC: 34.8 g/dL (ref 30.0–36.0)
MCV: 89 fL (ref 80.0–100.0)
Platelets: 204 10*3/uL (ref 150–400)
RBC: 4.46 MIL/uL (ref 4.22–5.81)
RDW: 12.5 % (ref 11.5–15.5)
WBC: 10.1 10*3/uL (ref 4.0–10.5)
nRBC: 0 % (ref 0.0–0.2)

## 2021-07-18 LAB — BASIC METABOLIC PANEL
Anion gap: 8 (ref 5–15)
BUN: 19 mg/dL (ref 8–23)
CO2: 23 mmol/L (ref 22–32)
Calcium: 8.3 mg/dL — ABNORMAL LOW (ref 8.9–10.3)
Chloride: 109 mmol/L (ref 98–111)
Creatinine, Ser: 1.25 mg/dL — ABNORMAL HIGH (ref 0.61–1.24)
GFR, Estimated: 57 mL/min — ABNORMAL LOW (ref >60)
Glucose, Bld: 97 mg/dL (ref 70–99)
Potassium: 3.7 mmol/L (ref 3.5–5.1)
Sodium: 140 mmol/L (ref 135–145)

## 2021-07-18 LAB — MAGNESIUM: Magnesium: 2.4 mg/dL (ref 1.7–2.4)

## 2021-07-18 LAB — TSH: TSH: 1.26 u[IU]/mL (ref 0.350–4.500)

## 2021-07-18 LAB — TROPONIN I (HIGH SENSITIVITY): Troponin I (High Sensitivity): 65 ng/L — ABNORMAL HIGH (ref <18)

## 2021-07-18 MED ORDER — HYDROXYZINE HCL 25 MG PO TABS
25.0000 mg | ORAL_TABLET | Freq: Once | ORAL | Status: AC | PRN
  Administered 2021-07-18: 25 mg via ORAL
  Filled 2021-07-18: qty 1

## 2021-07-18 MED ORDER — HYDRALAZINE HCL 25 MG PO TABS: 25.0000 mg | ORAL_TABLET | Freq: Three times a day (TID) | ORAL | Status: DC | PRN

## 2021-07-18 MED ORDER — HYDRALAZINE HCL 20 MG/ML IJ SOLN
10.0000 mg | Freq: Once | INTRAMUSCULAR | Status: AC
  Administered 2021-07-18: 10 mg via INTRAVENOUS
  Filled 2021-07-18: qty 1

## 2021-07-18 MED ORDER — POTASSIUM CHLORIDE CRYS ER 20 MEQ PO TBCR
40.0000 meq | EXTENDED_RELEASE_TABLET | Freq: Once | ORAL | Status: AC
  Administered 2021-07-18: 40 meq via ORAL
  Filled 2021-07-18: qty 2

## 2021-07-18 NOTE — Progress Notes (Signed)
  Transition of Care [facility]) Screening Note   Patient Details  Name: Cole Mayer Date of Birth: 08/17/1935   Transition of Care [facility]) CM/SW Contact:    Ihor Gully, LCSW Phone Number: 07/18/2021, 12:04 PM    Transition of Care Department [facility]) has reviewed patient and no TOC needs have been identified at this time. We will continue to monitor patient advancement through interdisciplinary progression rounds. If new patient transition needs arise, please place a TOC consult.

## 2021-07-18 NOTE — Progress Notes (Signed)
Tammy with Central Telemetry monitoring called to report that patient's heart rate dropped to 37 with a 2.77 sec pause. This RN woke patient and assessed him. He easily arouses and denies chest pain. This RN reached out to Dr Josph MachoLamont Snowball and informed her of bradycardia as well as troponin's trending up.She recommends a repeat troponin and Magnesium with AM labs. Joan Flores, RN

## 2021-07-18 NOTE — Progress Notes (Addendum)
Tele states that patient is in 2nd degree type two with hr in the 30s. MD Karleen Hampshire notified. EKG was ordered.
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
## 2021-07-18 NOTE — Progress Notes (Signed)
At  0602 this RN reached out to Dr. Josph MachoLamont Snowball to inform her that patient is hypertensive 176/110 manually. He denies chest pain and is not short of breath. Dr.Zierle- Lamont Snowball ordered 10 mg hydralazine IV.  Bryson Corona Edd Fabian

## 2021-07-18 NOTE — Care Management Obs Status (Signed)
[place] NOTIFICATION   Patient Details  Name: Cole Mayer MRN: [id number] Date of Birth: [birth year]   Medicare Observation Status Notification Given:  Yes    [name] 07/18/2021, 4:07 PM

## 2021-07-18 NOTE — Progress Notes (Addendum)
[facility]                                                                               Cole Mayer, is a 86 y.o. male, DOB - [birth year], KAJ:[id number] Admit date - 07/17/2021    Outpatient Primary MD for the patient is Monico Blitz, MD  LOS - 0  days    Brief summary    Cole Mayer is a 86 y.o. male with medical history significant for CAD, dementia, hypertension. Patient was brought to the ED by EMS after he was found wandering about.  Patient lives at home with his wife, this is the second time patient has left the house and gone wandering.   Assessment & Plan    Assessment and Plan: * Acute encephalopathy Acute metabolic encephalopathy from dehydration.  On admission pt was dehydrated  and in mild AKI, with hypokalemia.  No source of infection so far. CT head without any abnormality.  With significant memory deficits. Continue to monitor.    Elevated troponin/ QT prolongation.  Denies chest pain.  EKG showing accelerated junctional rhythm, but artifacts are present.  QTc 510.  Troponin 57 > 76. Repeat EKG shows sinus bradycardia with first degree AV block with prolonged Qtc.  Keep potassium greater than 4 and Magnesium greater than 2.  Elevated troponins probably from demand ischemia from dehydration   Asymptomatic Bradycardia:  Get TSH, stop the metoprolol .  Recheck EKG in am.   Holding aricept in view of the Bradycardia.   Dementia ([place]) At baseline, he has good and bad days, some days he is able to recognize spouse and family, able to answer simple questions.  He can feed and toilet himself, but requires help with most ADLs including clothing Therapy evaluations ordered. Continue with Namenda.  In view of the above and deconditioning, will repeat palliative care consult for goals of care discussion.     Estimated body mass index is 27 kg/m as calculated from the following:   Height as of this encounter: 6' (1.829 m).   Weight as of this  encounter: 90.3 kg.  Code Status: full code.  DVT Prophylaxis:  SCDs Start: 07/17/21 2225   Level of Care: Level of care: Telemetry Family Communication: Updated patient's family at bedside.   Disposition Plan:     Remains inpatient appropriate:  prolonged Qtc, dehydration and further work up for bradycardia.     Consultants:   none  Antimicrobials:   Anti-infectives (From admission, onward)    None        Medications  Scheduled Meds:  aspirin EC  81 mg Oral Daily   bacitracin   Topical BID   donepezil  10 mg Oral QHS   levothyroxine  112 mcg Oral QAC breakfast   memantine  28 mg Oral Daily   ramipril  10 mg Oral Daily   Continuous Infusions:  0.9 % NaCl with KCl 40 mEq / L 75 mL/hr at 07/18/21 1045   PRN Meds:.acetaminophen **OR** acetaminophen, polyethylene glycol    Subjective:   Cole Mayer was seen and examined today.   Comfortable, denies any complaints.   Objective:   Vitals:  07/18/21 0654 07/18/21 0857 07/18/21 1005 07/18/21 1209  BP: (!) 170/108 (!) 180/69 (!) 171/71 (!) 151/72  Pulse:  66 62   Resp:   18   Temp:   97.6 F (36.4 C)   TempSrc:   Oral   SpO2:   96% 100%  Weight:      Height:        Intake/Output Summary (Last 24 hours) at 07/18/2021 1338 Last data filed at 07/18/2021 0945 Gross per 24 hour  Intake 1683.15 ml  Output 1000 ml  Net 683.15 ml   Filed Weights   07/17/21 1430 07/17/21 2140  Weight: 79.9 kg 90.3 kg     Exam General exam: Appears calm and comfortable  Respiratory system: Clear to auscultation. Respiratory effort normal. Cardiovascular system: S1 & S2 heard, RRR. No JVD, No pedal edema. Gastrointestinal system: Abdomen is nondistended, soft and nontender. Normal bowel sounds heard. Central nervous system: Alert and oriented to self, grossly non focal.  Extremities: Symmetric 5 x 5 power. Skin: No rashes, lesions or ulcers Psychiatry: flat affect.     Data Reviewed:  I have personally reviewed  following labs and imaging studies   CBC Lab Results  Component Value Date   WBC 10.1 07/18/2021   RBC 4.46 07/18/2021   HGB 13.8 07/18/2021   HCT 39.7 07/18/2021   MCV 89.0 07/18/2021   MCH 30.9 07/18/2021   PLT 204 07/18/2021   MCHC 34.8 07/18/2021   RDW 12.5 07/18/2021   LYMPHSABS 1.4 07/17/2021   MONOABS 0.8 07/17/2021   EOSABS 0.0 07/17/2021   BASOSABS 0.1 30/86/5784     Last metabolic panel Lab Results  Component Value Date   NA 140 07/18/2021   K 3.7 07/18/2021   CL 109 07/18/2021   CO2 23 07/18/2021   BUN 19 07/18/2021   CREATININE 1.25 (H) 07/18/2021   GLUCOSE 97 07/18/2021   GFRNONAA 57 (L) 07/18/2021   CALCIUM 8.3 (L) 07/18/2021   PROT 7.0 07/17/2021   ALBUMIN 3.9 07/17/2021   BILITOT 1.5 (H) 07/17/2021   ALKPHOS 36 (L) 07/17/2021   AST 24 07/17/2021   ALT 18 07/17/2021   ANIONGAP 8 07/18/2021    CBG (last 3)  No results for input(s): "GLUCAP" in the last 72 hours.    Coagulation Profile: No results for input(s): "INR", "PROTIME" in the last 168 hours.   Radiology Studies: DG CHEST PORT 1 VIEW  Result Date: 07/17/2021 CLINICAL DATA:  Altered mental status EXAM: PORTABLE CHEST 1 VIEW COMPARISON:  04/18/2018 FINDINGS: The heart size and mediastinal contours are within normal limits. Both lungs are clear. The visualized skeletal structures are unremarkable. IMPRESSION: No active disease. Electronically Signed   By: Elmer Picker M.D.   On: 07/17/2021 20:59   DG Shoulder Left  Result Date: 07/17/2021 CLINICAL DATA:  Tenderness LEFT shoulder, trauma, fall EXAM: LEFT SHOULDER - 2+ VIEW COMPARISON:  None FINDINGS: Osseous demineralization. AC joint alignment normal. Visualized ribs intact. No fracture, dislocation, or bone destruction. IMPRESSION: No acute osseous abnormalities. Electronically Signed   By: Lavonia Dana M.D.   On: 07/17/2021 17:11   CT Head Wo Contrast  Result Date: 07/17/2021 CLINICAL DATA:  Multiple abrasions and scratches,  patient found wandering, ataxia, head trauma. EXAM: CT HEAD WITHOUT CONTRAST CT CERVICAL SPINE WITHOUT CONTRAST TECHNIQUE: Multidetector CT imaging of the head and cervical spine was performed following the standard protocol without intravenous contrast. Multiplanar CT image reconstructions of the cervical spine were also generated. RADIATION DOSE REDUCTION: This  exam was performed according to the departmental dose-optimization program which includes automated exposure control, adjustment of the mA and/or kV according to patient size and/or use of iterative reconstruction technique. COMPARISON:  Report from 05/05/2017 FINDINGS: CT HEAD FINDINGS Brain: Atrophy with associated ventriculomegaly including the temporal horns, likely ex vacuo. Periventricular white matter and corona radiata hypodensities favor chronic ischemic microvascular white matter disease. Otherwise, the brainstem, cerebellum, cerebral peduncles, thalamus, basal ganglia, basilar cisterns, and ventricular system appear within normal limits. No intracranial hemorrhage, mass lesion, or acute CVA. Vascular: There is atherosclerotic calcification of the cavernous carotid arteries bilaterally. Skull: Unremarkable Sinuses/Orbits: Chronic ethmoid sinusitis. Other: No supplemental non-categorized findings. CT CERVICAL SPINE FINDINGS Alignment: No vertebral subluxation is observed. Skull base and vertebrae: No fracture or acute bony findings. Soft tissues and spinal canal: Bilateral common carotid atherosclerotic calcification. Disc levels: Mild left foraminal impingement at C2-3 and C3-4, and mild right foraminal impingement at C3-4, C4-5, and C5-6, due to uncinate and facet spurring. Moderate central narrowing of the thecal sac at C3-4 due to central disc protrusion and intervertebral spurring. Upper chest: Unremarkable Other: No supplemental non-categorized findings. IMPRESSION: 1. No acute intracranial findings and no acute cervical spine findings. 2.  Cerebral atrophy with associated ex vacuo ventriculomegaly. 3. Periventricular white matter and corona radiata hypodensities favor chronic ischemic microvascular white matter disease. 4. Atherosclerosis. 5. Mild to moderate multilevel cervical impingement due to spondylosis and degenerative disc disease. Electronically Signed   By: Van Clines M.D.   On: 07/17/2021 15:45   CT Cervical Spine Wo Contrast  Result Date: 07/17/2021 CLINICAL DATA:  Multiple abrasions and scratches, patient found wandering, ataxia, head trauma. EXAM: CT HEAD WITHOUT CONTRAST CT CERVICAL SPINE WITHOUT CONTRAST TECHNIQUE: Multidetector CT imaging of the head and cervical spine was performed following the standard protocol without intravenous contrast. Multiplanar CT image reconstructions of the cervical spine were also generated. RADIATION DOSE REDUCTION: This exam was performed according to the departmental dose-optimization program which includes automated exposure control, adjustment of the mA and/or kV according to patient size and/or use of iterative reconstruction technique. COMPARISON:  Report from 05/05/2017 FINDINGS: CT HEAD FINDINGS Brain: Atrophy with associated ventriculomegaly including the temporal horns, likely ex vacuo. Periventricular white matter and corona radiata hypodensities favor chronic ischemic microvascular white matter disease. Otherwise, the brainstem, cerebellum, cerebral peduncles, thalamus, basal ganglia, basilar cisterns, and ventricular system appear within normal limits. No intracranial hemorrhage, mass lesion, or acute CVA. Vascular: There is atherosclerotic calcification of the cavernous carotid arteries bilaterally. Skull: Unremarkable Sinuses/Orbits: Chronic ethmoid sinusitis. Other: No supplemental non-categorized findings. CT CERVICAL SPINE FINDINGS Alignment: No vertebral subluxation is observed. Skull base and vertebrae: No fracture or acute bony findings. Soft tissues and spinal canal:  Bilateral common carotid atherosclerotic calcification. Disc levels: Mild left foraminal impingement at C2-3 and C3-4, and mild right foraminal impingement at C3-4, C4-5, and C5-6, due to uncinate and facet spurring. Moderate central narrowing of the thecal sac at C3-4 due to central disc protrusion and intervertebral spurring. Upper chest: Unremarkable Other: No supplemental non-categorized findings. IMPRESSION: 1. No acute intracranial findings and no acute cervical spine findings. 2. Cerebral atrophy with associated ex vacuo ventriculomegaly. 3. Periventricular white matter and corona radiata hypodensities favor chronic ischemic microvascular white matter disease. 4. Atherosclerosis. 5. Mild to moderate multilevel cervical impingement due to spondylosis and degenerative disc disease. Electronically Signed   By: Van Clines M.D.   On: 07/17/2021 15:45       Hosie Poisson M.D. [facility] 07/18/2021,  1:38 PM  Available via Epic secure chat 7am-7pm After 7 pm, please refer to night coverage provider listed on amion.

## 2021-07-19 ENCOUNTER — Ambulatory Visit: Payer: Medicare PPO | Admitting: Urology

## 2021-07-19 ENCOUNTER — Inpatient Hospital Stay (HOSPITAL_COMMUNITY): Payer: Medicare PPO

## 2021-07-19 ENCOUNTER — Other Ambulatory Visit (HOSPITAL_COMMUNITY): Payer: Self-pay | Admitting: *Deleted

## 2021-07-19 DIAGNOSIS — R778 Other specified abnormalities of plasma proteins: Secondary | ICD-10-CM | POA: Diagnosis not present

## 2021-07-19 DIAGNOSIS — Z7189 Other specified counseling: Secondary | ICD-10-CM | POA: Diagnosis not present

## 2021-07-19 DIAGNOSIS — R9431 Abnormal electrocardiogram [ECG] [EKG]: Secondary | ICD-10-CM | POA: Diagnosis not present

## 2021-07-19 DIAGNOSIS — G9341 Metabolic encephalopathy: Secondary | ICD-10-CM

## 2021-07-19 DIAGNOSIS — Z515 Encounter for palliative care: Secondary | ICD-10-CM | POA: Diagnosis not present

## 2021-07-19 DIAGNOSIS — F0390 Unspecified dementia without behavioral disturbance: Secondary | ICD-10-CM | POA: Diagnosis not present

## 2021-07-19 DIAGNOSIS — G9340 Encephalopathy, unspecified: Secondary | ICD-10-CM | POA: Diagnosis not present

## 2021-07-19 LAB — CBC WITH DIFFERENTIAL/PLATELET
Abs Immature Granulocytes: 0.06 10*3/uL (ref 0.00–0.07)
Basophils Absolute: 0.1 10*3/uL (ref 0.0–0.1)
Basophils Relative: 1 %
Eosinophils Absolute: 0.1 10*3/uL (ref 0.0–0.5)
Eosinophils Relative: 1 %
HCT: 39.3 % (ref 39.0–52.0)
Hemoglobin: 13.3 g/dL (ref 13.0–17.0)
Immature Granulocytes: 1 %
Lymphocytes Relative: 17 %
Lymphs Abs: 1.7 10*3/uL (ref 0.7–4.0)
MCH: 30.4 pg (ref 26.0–34.0)
MCHC: 33.8 g/dL (ref 30.0–36.0)
MCV: 89.7 fL (ref 80.0–100.0)
Monocytes Absolute: 1 10*3/uL (ref 0.1–1.0)
Monocytes Relative: 10 %
Neutro Abs: 7 10*3/uL (ref 1.7–7.7)
Neutrophils Relative %: 70 %
Platelets: 193 10*3/uL (ref 150–400)
RBC: 4.38 MIL/uL (ref 4.22–5.81)
RDW: 12.5 % (ref 11.5–15.5)
WBC: 9.9 10*3/uL (ref 4.0–10.5)
nRBC: 0 % (ref 0.0–0.2)

## 2021-07-19 LAB — ECHOCARDIOGRAM COMPLETE
AR max vel: 1.92 cm2
AV Area VTI: 1.86 cm2
AV Area mean vel: 1.94 cm2
AV Mean grad: 9 mmHg
AV Peak grad: 20.3 mmHg
Ao pk vel: 2.25 m/s
Area-P 1/2: 2.6 cm2
Height: 72 in
P 1/2 time: 599 msec
S' Lateral: 3.4 cm
Weight: 3185.6 oz

## 2021-07-19 LAB — BASIC METABOLIC PANEL
Anion gap: 4 — ABNORMAL LOW (ref 5–15)
BUN: 16 mg/dL (ref 8–23)
CO2: 24 mmol/L (ref 22–32)
Calcium: 8.4 mg/dL — ABNORMAL LOW (ref 8.9–10.3)
Chloride: 112 mmol/L — ABNORMAL HIGH (ref 98–111)
Creatinine, Ser: 1.17 mg/dL (ref 0.61–1.24)
GFR, Estimated: >60 mL/min (ref >60)
Glucose, Bld: 96 mg/dL (ref 70–99)
Potassium: 3.5 mmol/L (ref 3.5–5.1)
Sodium: 140 mmol/L (ref 135–145)

## 2021-07-19 MED ORDER — POLYETHYLENE GLYCOL 3350 17 G PO PACK: 17.0000 g | PACK | Freq: Every day | ORAL | 0 refills | Status: DC | PRN

## 2021-07-19 NOTE — Progress Notes (Signed)
Palliative: Mr. Jorge, Retz, is lying quietly in bed.  He appears chronically ill and somewhat frail.  He greets me, making and mostly keeping eye contact.  He has known dementia.  I believe that he can make his basic needs known.  His wife, [name], is present at bedside.  We talk about his acute illness, selected labs, further testing, discharge needs.  I share that Mr. Keating has been transitioned to inpatient from observation status.  At this point, [name] states that she and her daughter feel like they can care for him at home.  [name] shares that she and her daughter discussed outpatient palliative services yesterday and they would like to start services.  Provider choice offered, hosp[facility] of [facility] selected.  Conference with attending, bedside nursing staff, transition of care team related to patient condition, needs, goals of care, disposition.  Plan:   Continue full scope/full code.  States that they will make CODE STATUS decisions as things start to change.  Requesting outpatient palliative services with hosp[facility] of [facility].  24 minutes Quinn Axe, NP Palliative medicine team Team phone [phone] Greater than 50% of this time was spent counseling and coordinating care related to the above assessment and plan.

## 2021-07-19 NOTE — Progress Notes (Signed)
*  PRELIMINARY RESULTS* Echocardiogram 2D Echocardiogram has been performed.  Cole Mayer 07/19/2021, 11:43 AM

## 2021-07-19 NOTE — Evaluation (Signed)
Physical Therapy Evaluation Patient Details Name: Cole Mayer MRN: [id number] DOB: 11-22-1935 Today's Date: 07/19/2021  History of Present Illness  Cole Mayer is a 86 y.o. male with medical history significant for CAD, dementia, hypertension.  Patient was brought to the ED by EMS after he was found wandering about.  Patient lives at home with his wife, this is the second time patient has left the house and gone wandering.   Today patient left the house, and was gone for about 5 hours, patient's wife had to call the sheriff to help her look for patient.  Patient was found in the woods on another patient's property, with a lot of bruising on his head and extremities.   Clinical Impression  Patient presents supine in bed with family present. Consents to PT evaluation. Patient is independent with bed mobility demonstrating appropriate strength and trunk control needed to complete task. Patient is independent with transfers demonstrating smooth and efficient movement with ability to stand upright. Patient is able to ambulate 150 feet in hallway with min guard for safety and no AD. Mild gait deviations impacting gait pattern but able to ambulate at appropriate speed. Occasional drifting L/R with lack of spatial awareness. Mild balance deficits observed. Patient functioning near/at baseline for functional mobility and gait. Patient left in bed with family in room and nursing staff notified of mobility status. PLAN:  Patient to be discharged home today and discharged from acute physical therapy to care of nursing for ambulation as tolerated for length of stay with recommendations stated below       Recommendations for follow up therapy are one component of a multi-disciplinary discharge planning process, led by the attending physician.  Recommendations may be updated based on patient status, additional functional criteria and insurance authorization.  Follow Up Recommendations No PT follow up       Assistance Recommended at Discharge Set up Supervision/Assistance  Patient can return home with the following  A little help with walking and/or transfers;Help with stairs or ramp for entrance    Equipment Recommendations None recommended by PT  Recommendations for Other Services       Functional Status Assessment Patient has not had a recent decline in their functional status     Precautions / Restrictions Precautions Precautions: Fall Restrictions Weight Bearing Restrictions: No      Mobility  Bed Mobility Overal bed mobility: Independent             General bed mobility comments: Independent supine to sit with appropriate strength and trunk control    Transfers Overall transfer level: Independent Equipment used: None               General transfer comment: Independent with transfers and able to stand upright    Ambulation/Gait Ambulation/Gait assistance: Min guard Gait Distance (Feet): 150 Feet Assistive device: None Gait Pattern/deviations: Decreased step length - right, Decreased step length - left, Decreased stride length, Wide base of support, Drifts right/left, Decreased dorsiflexion - left, Decreased dorsiflexion - right Gait velocity: normal     General Gait Details: Able to ambulate 150 feet in hallway with min guard for safety and no AD. Mild gait deviations impacting gait pattern but able to ambulate at appropriate speed. Occassional drifting with lack of spatial awareness. Mild balance deficits observed.  Stairs            Wheelchair Mobility    Modified Rankin (Stroke Patients Only)       Balance Overall balance assessment:  Mild deficits observed, not formally tested                                           Pertinent Vitals/Pain Pain Assessment Pain Assessment: No/denies pain    Home Living Family/patient expects to be discharged to:: Private residence Living Arrangements: Spouse/significant  other Available Help at Discharge: Family;Available 24 hours/day;Available PRN/intermittently Type of Home: House Home Access: Level entry       Home Layout: One level Home Equipment: Cane - single point;Other (comment);Grab bars - tub/shower;Shower seat (walking stick)      Prior Function Prior Level of Function : Needs assist       Physical Assist : ADLs (physical)   ADLs (physical): IADLs Mobility Comments: Patient reports being able to ambulate independently at home with intermittent use of SPC and walking stick when needed. Also a Hydrographic surveyor. Unsure if currently driving ADLs Comments: Patient reports being independent with ADL's with wife helping with dressing prn.     Hand Dominance        Extremity/Trunk Assessment   Upper Extremity Assessment Upper Extremity Assessment: Overall WFL for tasks assessed    Lower Extremity Assessment Lower Extremity Assessment: Overall WFL for tasks assessed    Cervical / Trunk Assessment Cervical / Trunk Assessment: Normal  Communication   Communication: No difficulties  Cognition Arousal/Alertness: Awake/alert Behavior During Therapy: WFL for tasks assessed/performed Overall Cognitive Status: History of cognitive impairments - at baseline                                 General Comments: Dementia at baseline        General Comments      Exercises     Assessment/Plan    PT Assessment Patient does not need any further PT services  PT Problem List         PT Treatment Interventions      PT Goals (Current goals can be found in the Care Plan section)  Acute Rehab PT Goals Patient Stated Goal: return home PT Goal Formulation: With patient/family Time For Goal Achievement: 07/19/21 Potential to Achieve Goals: Good    Frequency       Co-evaluation               AM-PAC PT "6 Clicks" Mobility  Outcome Measure Help needed turning from your back to your side while in a flat bed  without using bedrails?: None Help needed moving from lying on your back to sitting on the side of a flat bed without using bedrails?: None Help needed moving to and from a bed to a chair (including a wheelchair)?: None Help needed standing up from a chair using your arms (e.g., wheelchair or bedside chair)?: None Help needed to walk in hospital room?: A Little Help needed climbing 3-5 steps with a railing? : A Little 6 Click Score: 22    End of Session   Activity Tolerance: Patient tolerated treatment well;No increased pain Patient left: in bed;with family/visitor present Nurse Communication: Mobility status PT Visit Diagnosis: Unsteadiness on feet (R26.81);Other abnormalities of gait and mobility (R26.89);Muscle weakness (generalized) (M62.81)    Time: 6045-4098 PT Time Calculation (min) (ACUTE ONLY): 20 min   Charges:   PT Evaluation $PT Eval Moderate Complexity: 1 Mod PT Treatments $Therapeutic Activity: 8-22 mins  2:26 PM, 07/19/21 Lestine Box, S/PT

## 2021-07-19 NOTE — Progress Notes (Signed)
Nsg Discharge Note  Admit Date:  07/17/2021 Discharge date: 07/19/2021   Cole Mayer to be D/C'd Home per MD order.  AVS completed.   Patient/caregiver able to verbalize understanding.  Discharge Medication: Allergies as of 07/19/2021   No Known Allergies      Medication List     STOP taking these medications    diazepam 5 MG tablet Commonly known as: VALIUM   donepezil 10 MG tablet Commonly known as: ARICEPT   doxycycline 100 MG capsule Commonly known as: VIBRAMYCIN   metoprolol succinate 50 MG 24 hr tablet Commonly known as: TOPROL-XL       TAKE these medications    aspirin EC 81 MG tablet Take 81 mg by mouth daily.   ipratropium 0.06 % nasal spray Commonly known as: ATROVENT   levothyroxine 112 MCG tablet Commonly known as: SYNTHROID Take 112 mcg by mouth daily before breakfast.   LORazepam 0.5 MG tablet Commonly known as: ATIVAN Take 0.5 mg by mouth daily as needed for anxiety or sedation.   memantine 28 MG Cp24 24 hr capsule Commonly known as: NAMENDA XR Take 28 mg by mouth daily.   nitroGLYCERIN 0.4 MG SL tablet Commonly known as: NITROSTAT Place 1 tablet (0.4 mg total) under the tongue every 5 (five) minutes x 3 doses as needed for chest pain (if no relief after 2nd dose, proceed to ED or call 911). Up to 3 doses.   polyethylene glycol 17 g packet Commonly known as: MIRALAX / GLYCOLAX Take 17 g by mouth daily as needed for mild constipation.   ramipril 10 MG capsule Commonly known as: ALTACE Take 10 mg by mouth daily.   simvastatin 40 MG tablet Commonly known as: ZOCOR Take 40 mg by mouth daily.        Discharge Assessment: Vitals:   07/19/21 0439 07/19/21 1420  BP: 138/72 (!) 155/68  Pulse: (!) 58 64  Resp: 18 18  Temp: 98.4 F (36.9 C) 97.7 F (36.5 C)  SpO2: 95% 96%   Skin clean, dry and intact without evidence of skin break down, no evidence of skin tears noted. IV catheter discontinued intact. Site without signs and  symptoms of complications - no redness or edema noted at insertion site, patient denies c/o pain - only slight tenderness at site.  Dressing with slight pressure applied.  D/c Instructions-Education: Discharge instructions given to patient/family with verbalized understanding. D/c education completed with patient/family including follow up instructions, medication list, d/c activities limitations if indicated, with other d/c instructions as indicated by MD - patient able to verbalize understanding, all questions fully answered. Patient instructed to return to ED, call 911, or call MD for any changes in condition.  Patient escorted via [place], and D/C home via private auto.  Kathie Rhodes, RN 07/19/2021 5:59 PM

## 2021-07-20 ENCOUNTER — Telehealth: Payer: Self-pay | Admitting: Cardiology

## 2021-07-20 NOTE — Telephone Encounter (Signed)
Per d/c summary -   Follow-Ups  Schedule an appointment with Monico Blitz, MD (Internal Medicine) in 1 week (07/26/2021) Schedule an appointment with Satira Sark, MD (Cardiology) in 1 week (07/26/2021) Follow up with [facility] (CardioPulmonary   Please advise as we know Dr. Domenic Polite does not have anything available.  Also, doesn't look like any of the extenders has anything for about a month out.

## 2021-07-20 NOTE — Telephone Encounter (Signed)
Wife Chief Operating Officer) notified.  OV scheduled for Wednesday, 7/5 at 8:00 am.

## 2021-07-20 NOTE — Telephone Encounter (Signed)
Wife of patient called. Patient was released from the ED last night and was told to follow up with Dr. Domenic Polite in a week.  No appointments were available until 09/05/21 and the wife simply said that is unacceptable and she could not wait that long. The patient had a lot of changes made to his medications and he needs to be seen soon.  Please call wife on the home phone number and leave a message on their home answering machine if they are not home

## 2021-07-21 NOTE — Progress Notes (Unsigned)
Cardiology Office Note  Date: 07/25/2021   ID: Elwyn, Klosinski 1935-01-22, MRN [id number]  PCP:  Monico Blitz, MD  Cardiologist:  Rozann Lesches, MD Electrophysiologist:  None   Chief Complaint  Patient presents with   Hospitalization Follow-up    History of Present Illness: Cole Mayer is an 86 y.o. male last seen in April and now presenting for a posthospital follow-up visit.  Records indicate recent hospitalization with acute encephalopathy on top of known dementia, patient was brought in by EMS after he was found wandering.  Due to bradycardia he was taken off metoprolol, also had minimal elevation in high-sensitivity troponin I levels in the 50-70 range without clear evidence of ACS.  He was hydrated given concerns about volume contraction as well.  At discharge he was taken off of diazepam, donepezil, doxycycline, and Toprol-XL.  He is here today with his wife.  She tells me that he seems back to baseline.  He does not describe any chest pain on discussion today.  He has had no obvious palpitations or syncope.  Heart rate today is in the 60s.  Initial blood pressure elevated with systolic in the 381W, but rechecked by me at 138/86.  They do have a home blood pressure cuff and will check blood pressure periodically.  He is on Altace, but if blood pressure trends up may need an additional agent.  I reviewed his medications and recent lab work as well as follow-up echocardiogram.  Past Medical History:  Diagnosis Date   Coronary atherosclerosis of native coronary artery    DES LAD and DES x3 RCA 2005, LVEF 60%    Dementia ([place])    Essential hypertension    Hypothyroidism    Mixed hyperlipidemia    Myocardial infarction ([place])    1989 (Streptokinase) and 2005    Past Surgical History:  Procedure Laterality Date   CHOLECYSTECTOMY     COLONOSCOPY N/A 01/18/2015   Procedure: COLONOSCOPY;  Surgeon: Rogene Houston, MD;  Location: [facility];  Service: Endoscopy;   Laterality: N/A;  730   CORONARY ANGIOPLASTY     Stents   TONSILLECTOMY      Current Outpatient Medications  Medication Sig Dispense Refill   aspirin EC 81 MG tablet Take 81 mg by mouth daily.     ipratropium (ATROVENT) 0.06 % nasal spray      levothyroxine (SYNTHROID, LEVOTHROID) 112 MCG tablet Take 112 mcg by mouth daily before breakfast.      LORazepam (ATIVAN) 0.5 MG tablet Take 0.5 mg by mouth daily as needed for anxiety or sedation.     memantine (NAMENDA XR) 28 MG CP24 24 hr capsule Take 28 mg by mouth daily.     nitroGLYCERIN (NITROSTAT) 0.4 MG SL tablet Place 1 tablet (0.4 mg total) under the tongue every 5 (five) minutes x 3 doses as needed for chest pain (if no relief after 2nd dose, proceed to ED or call 911). Up to 3 doses. 25 tablet 3   polyethylene glycol (MIRALAX / GLYCOLAX) 17 g packet Take 17 g by mouth daily as needed for mild constipation. 14 each 0   ramipril (ALTACE) 10 MG capsule Take 10 mg by mouth daily.     simvastatin (ZOCOR) 40 MG tablet Take 40 mg by mouth daily.     No current facility-administered medications for this visit.   Allergies:  Patient has no known allergies.   ROS: No orthopnea or PND.  No syncope.  Physical Exam:  VS:  BP 138/86   Pulse 62   Ht 6' (1.829 m)   Wt 202 lb 6.4 oz (91.8 kg)   SpO2 98%   BMI 27.45 kg/m , BMI Body mass index is 27.45 kg/m.  Wt Readings from Last 3 Encounters:  07/25/21 202 lb 6.4 oz (91.8 kg)  07/17/21 199 lb 1.6 oz (90.3 kg)  04/24/21 208 lb 12.8 oz (94.7 kg)    General: Patient appears comfortable at rest. HEENT: Conjunctiva and lids normal. Neck: Supple, no elevated JVP or carotid bruits. Lungs: Clear to auscultation, nonlabored breathing at rest. Cardiac: Regular rate and rhythm, no S3, 1/6 systolic murmur, no pericardial rub. Extremities: No pitting edema.  ECG:  An ECG dated 07/18/2021 was personally reviewed today and demonstrated:  Sinus bradycardia with significantly prolonged PR interval,  nonspecific ST changes, and prolonged QT interval.  Right bundle branch block and old inferior infarct pattern noted.  Recent Labwork: 07/17/2021: ALT 18; AST 24 07/18/2021: Magnesium 2.4; TSH 1.260 07/19/2021: BUN 16; Creatinine, Ser 1.17; Hemoglobin 13.3; Platelets 193; Potassium 3.5; Sodium 140   Other Studies Reviewed Today:  Lexiscan Myoview 03/18/2017: T wave inversion was noted during stress in the V3, V4 and III leads. Defect 1: There is a medium defect of moderate severity present in the mid inferior, mid inferolateral, apical inferior and apical lateral location. Findings may be consistent with prior myocardial infarction with a very mild degree of peri-infarct ischemia. However, variable soft tissue attenuation is also likely contributing to this defect. Nuclear stress EF: 64%. Overall, this is a low risk study.  Echocardiogram 07/19/2021: 1. Left ventricular ejection fraction, by estimation, is 55 to 60%. The  left ventricle has normal function. The left ventricle has no regional  wall motion abnormalities. Left ventricular diastolic parameters are  consistent with Grade II diastolic  dysfunction (pseudonormalization).   2. Right ventricular systolic function is normal. The right ventricular  size is normal. Tricuspid regurgitation signal is inadequate for assessing  PA pressure.   3. Left atrial size was moderately dilated.   4. The aortic valve is calcified. Aortic valve regurgitation is mild.  Mild aortic valve stenosis. Aortic valve area, by VTI measures 1.86 cm.  Aortic valve mean gradient measures 9.0 mmHg. Aortic valve Vmax measures  2.25 m/s. DI is 0.45.   5. The inferior vena cava is normal in size with greater than 50%  respiratory variability, suggesting right atrial pressure of 3 mmHg.   6. Aortic dilatation noted. There is borderline dilatation of the  ascending aorta, measuring 38 mm.   7. The mitral valve was not well visualized. Trivial mitral valve   regurgitation.   Assessment and Plan:  1.  Recently documented sinus bradycardia, combination of conduction system disease with age, also on Aricept and Toprol-XL which was discontinued at recent hospital stay.  His heart rate is in the 60s today.  We will continue to observe for now.  No syncope.  2.  CAD status post DES to the LAD and RCA in 2005.  Last ischemic testing was in 2019.  He did have a follow-up echocardiogram during recent hospital stay with LVEF 55 to 60%.  No wall motion abnormalities.  Minimal elevation in high-sensitivity troponin I not consistent with ACS and he does not report any recent chest pain.  Continue aspirin, Altace, Zocor, and as needed nitroglycerin.  3.  Essential hypertension.  Track blood pressure periodically with home cuff.  Continue Altace.  Medication Adjustments/Labs and Tests Ordered: Current medicines  are reviewed at length with the patient today.  Concerns regarding medicines are outlined above.   Tests Ordered: No orders of the defined types were placed in this encounter.   Medication Changes: No orders of the defined types were placed in this encounter.   Disposition:  Follow up  6 months.  Signed, Satira Sark, MD, [facility] 07/25/2021 8:13 AM    [facility], [place], [place] [address] Phone: [phone]; Fax: [phone]

## 2021-07-21 NOTE — Discharge Summary (Signed)
Physician Discharge Summary   Patient: Cole Mayer MRN: [id number] DOB: 03/28/1935  Admit date:     07/17/2021  Discharge date: 07/19/2021  Discharge Physician: Hosie Poisson   PCP: Monico Blitz, MD   Recommendations at discharge:  Please follow up with Cardiology next week.  Please follow up with PCP in 1 to 2 weeks.  We have stopped your metoprolol because of bradycardia and pauses. .  Please follow up with outpatient palliative care as recommended.  We have also stopped aricept for bradycardia.    Discharge Diagnoses: Principal Problem:   Acute encephalopathy Active Problems:   Dementia ([place])   Elevated troponin   Acute metabolic encephalopathy   Hospital Course:  BOWEN KIA is a 86 y.o. male with medical history significant for CAD, dementia, hypertension. Patient was brought to the ED by EMS after he was found wandering about.  Patient lives at home with his wife, this is the second time patient has left the house and gone wandering. he was found to be encephalopathic and dehydrated.    Assessment and Plan:     Acute encephalopathy Acute metabolic encephalopathy from dehydration.  On admission pt was dehydrated  and in mild AKI, with hypokalemia.  No source of infection so far. CT head without any abnormality.  With significant memory deficits. Continue to monitor.  Dehydration resolved with fluids.      Elevated troponin/ QT prolongation.  Denies chest pain.  EKG showing accelerated junctional rhythm, but artifacts are present.  QTc 510.  Troponin 57 > 76. Repeat EKG shows sinus bradycardia with first degree AV block with prolonged Qtc.  Keep potassium greater than 4 and Magnesium greater than 2.  Elevated troponins probably from demand ischemia from dehydration  Pt denies any chest pain or sob.    Asymptomatic Bradycardia:  Get TSH, stop the metoprolol for the pauses. TSH wnl.   Holding aricept in view of the Bradycardia.  Echocardiogram showed Left  ventricular ejection fraction, by estimation, is 55 to 60%. The  left ventricle has normal function. The left ventricle has no regional  wall motion abnormalities. Left ventricular diastolic parameters are  consistent with Grade II diastolic  dysfunction (pseudonormalization Recommend follow up with cardiology in one week.    Dementia ([name]) At baseline, he has good and bad days, some days he is able to recognize spouse and family, able to answer simple questions.  He can feed and toilet himself, but requires help with most ADLs including clothing Therapy evaluations ordered. Continue with Namenda.  In view of the above and deconditioning,  requested palliative care consult for goals of care discussion.    Estimated body mass index is 27 kg/m as calculated from the following:   Height as of this encounter: 6' (1.829 m).   Weight as of this encounter: 90.3 kg.      Consultants: palliative care Procedures performed: echo  Disposition: Home Diet recommendation:  Discharge Diet Orders (From admission, onward)     Start     Ordered   07/19/21 0000  Diet - low sodium heart healthy        07/19/21 1754           Regular diet DISCHARGE MEDICATION: Allergies as of 07/19/2021   No Known Allergies      Medication List     STOP taking these medications    diazepam 5 MG tablet Commonly known as: VALIUM   donepezil 10 MG tablet Commonly known as: ARICEPT  doxycycline 100 MG capsule Commonly known as: VIBRAMYCIN   metoprolol succinate 50 MG 24 hr tablet Commonly known as: TOPROL-XL       TAKE these medications    aspirin EC 81 MG tablet Take 81 mg by mouth daily.   ipratropium 0.06 % nasal spray Commonly known as: ATROVENT   levothyroxine 112 MCG tablet Commonly known as: SYNTHROID Take 112 mcg by mouth daily before breakfast.   LORazepam 0.5 MG tablet Commonly known as: ATIVAN Take 0.5 mg by mouth daily as needed for anxiety or sedation.   memantine 28 MG  Cp24 24 hr capsule Commonly known as: NAMENDA XR Take 28 mg by mouth daily.   nitroGLYCERIN 0.4 MG SL tablet Commonly known as: NITROSTAT Place 1 tablet (0.4 mg total) under the tongue every 5 (five) minutes x 3 doses as needed for chest pain (if no relief after 2nd dose, proceed to ED or call 911). Up to 3 doses.   polyethylene glycol 17 g packet Commonly known as: MIRALAX / GLYCOLAX Take 17 g by mouth daily as needed for mild constipation.   ramipril 10 MG capsule Commonly known as: ALTACE Take 10 mg by mouth daily.   simvastatin 40 MG tablet Commonly known as: ZOCOR Take 40 mg by mouth daily.        Follow-up Information     Monico Blitz, MD. Schedule an appointment as soon as possible for a visit in 1 week(s).   Specialty: Internal Medicine Contact information: [address]  [place] [address] [phone]         Satira Sark, MD. Schedule an appointment as soon as possible for a visit in 1 week(s).   Specialty: Cardiology Contact information: [place] [address] [phone]                Discharge Exam: Filed Weights   07/17/21 1430 07/17/21 2140  Weight: 79.9 kg 90.3 kg   General exam: Appears calm and comfortable  Respiratory system: Clear to auscultation. Respiratory effort normal. Cardiovascular system: S1 & S2 heard, RRR.  No pedal edema. Gastrointestinal system: Abdomen is nondistended, soft and nontender. Normal bowel sounds heard. Central nervous system: Alert and oriented. No focal neurological deficits. Extremities: Symmetric 5 x 5 power. Skin: bruising over the arm bandaged.  Psychiatry: Mood & affect appropriate.    Condition at discharge: fair  The results of significant diagnostics from this hospitalization (including imaging, microbiology, ancillary and laboratory) are listed below for reference.   Imaging Studies: ECHOCARDIOGRAM COMPLETE  Result Date: 07/19/2021    ECHOCARDIOGRAM REPORT   Patient  Name:   Cole Mayer Date of Exam: 07/19/2021 Medical Rec #:  [id number]       Height:       72.0 in Accession #:    [id number]      Weight:       199.1 lb Date of Birth:  02-Sep-1935       BSA:          2.126 m Patient Age:    86 years        BP:           138/72 mmHg Patient Gender: M               HR:           58 bpm. Exam Location:  [facility] Procedure: 2D Echo, Cardiac Doppler and Color Doppler Indications:    Abnormal ECG R94.31  History:        Patient has prior history of Echocardiogram examinations, most                 recent 11/18/2003. CAD and Previous Myocardial Infarction; Risk                 Factors:Hypertension and Dyslipidemia. Dementia ([place]), Elevated                 troponin.  Sonographer:    Alvino Chapel RCS Referring Phys: Theola Sequin IMPRESSIONS  1. Left ventricular ejection fraction, by estimation, is 55 to 60%. The left ventricle has normal function. The left ventricle has no regional wall motion abnormalities. Left ventricular diastolic parameters are consistent with Grade II diastolic dysfunction (pseudonormalization).  2. Right ventricular systolic function is normal. The right ventricular size is normal. Tricuspid regurgitation signal is inadequate for assessing PA pressure.  3. Left atrial size was moderately dilated.  4. The aortic valve is calcified. Aortic valve regurgitation is mild. Mild aortic valve stenosis. Aortic valve area, by VTI measures 1.86 cm. Aortic valve mean gradient measures 9.0 mmHg. Aortic valve Vmax measures 2.25 m/s. DI is 0.45.  5. The inferior vena cava is normal in size with greater than 50% respiratory variability, suggesting right atrial pressure of 3 mmHg.  6. Aortic dilatation noted. There is borderline dilatation of the ascending aorta, measuring 38 mm.  7. The mitral valve was not well visualized. Trivial mitral valve regurgitation. FINDINGS  Left Ventricle: Left ventricular ejection fraction, by estimation, is 55 to 60%. The left ventricle has  normal function. The left ventricle has no regional wall motion abnormalities. The left ventricular internal cavity size was normal in size. There is  no left ventricular hypertrophy. Left ventricular diastolic parameters are consistent with Grade II diastolic dysfunction (pseudonormalization). Indeterminate filling pressures. Right Ventricle: The right ventricular size is normal. No increase in right ventricular wall thickness. Right ventricular systolic function is normal. Tricuspid regurgitation signal is inadequate for assessing PA pressure. Left Atrium: Left atrial size was moderately dilated. Right Atrium: Right atrial size was normal in size. Pericardium: There is no evidence of pericardial effusion. Mitral Valve: The mitral valve was not well visualized. Trivial mitral valve regurgitation. Tricuspid Valve: The tricuspid valve is not well visualized. Tricuspid valve regurgitation is not demonstrated. Aortic Valve: The aortic valve is calcified. Aortic valve regurgitation is mild. Aortic regurgitation PHT measures 599 msec. Mild aortic stenosis is present. Aortic valve mean gradient measures 9.0 mmHg. Aortic valve peak gradient measures 20.2 mmHg. Aortic valve area, by VTI measures 1.86 cm. Pulmonic Valve: The pulmonic valve was grossly normal. Pulmonic valve regurgitation is trivial. Aorta: Aortic dilatation noted. There is borderline dilatation of the ascending aorta, measuring 38 mm. Venous: The inferior vena cava is normal in size with greater than 50% respiratory variability, suggesting right atrial pressure of 3 mmHg. IAS/Shunts: No atrial level shunt detected by color flow Doppler.  LEFT VENTRICLE PLAX 2D LVIDd:         4.80 cm   Diastology LVIDs:         3.40 cm   LV e' medial:    5.77 cm/s LV PW:         1.00 cm   LV E/e' medial:  11.5 LV IVS:        1.10 cm   LV e' lateral:   8.70 cm/s LVOT diam:     2.30 cm   LV E/e' lateral: 7.6 LV SV:  93 LV SV Index:   44 LVOT Area:     4.15 cm  RIGHT  VENTRICLE RV S prime:     14.00 cm/s TAPSE (M-mode): 2.5 cm LEFT ATRIUM           Index        RIGHT ATRIUM           Index LA diam:      3.40 cm 1.60 cm/m   RA Area:     20.70 cm LA Vol (A2C): 97.1 ml 45.66 ml/m  RA Volume:   64.20 ml  30.19 ml/m LA Vol (A4C): 85.4 ml 40.16 ml/m  AORTIC VALVE AV Area (Vmax):    1.92 cm AV Area (Vmean):   1.94 cm AV Area (VTI):     1.86 cm AV Vmax:           225.00 cm/s AV Vmean:          138.000 cm/s AV VTI:            0.499 m AV Peak Grad:      20.2 mmHg AV Mean Grad:      9.0 mmHg LVOT Vmax:         104.00 cm/s LVOT Vmean:        64.450 cm/s LVOT VTI:          0.223 m LVOT/AV VTI ratio: 0.45 AI PHT:            599 msec  AORTA Ao Root diam: 3.60 cm MITRAL VALVE MV Area (PHT): 2.60 cm    SHUNTS MV Decel Time: 292 msec    Systemic VTI:  0.22 m MV E velocity: 66.40 cm/s  Systemic Diam: 2.30 cm MV A velocity: 44.70 cm/s MV E/A ratio:  1.49 Lyman Bishop MD Electronically signed by Lyman Bishop MD Signature Date/Time: 07/19/2021/5:36:35 PM    Final    DG CHEST PORT 1 VIEW  Result Date: 07/17/2021 CLINICAL DATA:  Altered mental status EXAM: PORTABLE CHEST 1 VIEW COMPARISON:  04/18/2018 FINDINGS: The heart size and mediastinal contours are within normal limits. Both lungs are clear. The visualized skeletal structures are unremarkable. IMPRESSION: No active disease. Electronically Signed   By: Elmer Picker M.D.   On: 07/17/2021 20:59   DG Shoulder Left  Result Date: 07/17/2021 CLINICAL DATA:  Tenderness LEFT shoulder, trauma, fall EXAM: LEFT SHOULDER - 2+ VIEW COMPARISON:  None FINDINGS: Osseous demineralization. AC joint alignment normal. Visualized ribs intact. No fracture, dislocation, or bone destruction. IMPRESSION: No acute osseous abnormalities. Electronically Signed   By: Lavonia Dana M.D.   On: 07/17/2021 17:11   CT Head Wo Contrast  Result Date: 07/17/2021 CLINICAL DATA:  Multiple abrasions and scratches, patient found wandering, ataxia, head trauma.  EXAM: CT HEAD WITHOUT CONTRAST CT CERVICAL SPINE WITHOUT CONTRAST TECHNIQUE: Multidetector CT imaging of the head and cervical spine was performed following the standard protocol without intravenous contrast. Multiplanar CT image reconstructions of the cervical spine were also generated. RADIATION DOSE REDUCTION: This exam was performed according to the departmental dose-optimization program which includes automated exposure control, adjustment of the mA and/or kV according to patient size and/or use of iterative reconstruction technique. COMPARISON:  Report from 05/05/2017 FINDINGS: CT HEAD FINDINGS Brain: Atrophy with associated ventriculomegaly including the temporal horns, likely ex vacuo. Periventricular white matter and corona radiata hypodensities favor chronic ischemic microvascular white matter disease. Otherwise, the brainstem, cerebellum, cerebral peduncles, thalamus, basal ganglia, basilar cisterns, and ventricular system appear within normal limits. No  intracranial hemorrhage, mass lesion, or acute CVA. Vascular: There is atherosclerotic calcification of the cavernous carotid arteries bilaterally. Skull: Unremarkable Sinuses/Orbits: Chronic ethmoid sinusitis. Other: No supplemental non-categorized findings. CT CERVICAL SPINE FINDINGS Alignment: No vertebral subluxation is observed. Skull base and vertebrae: No fracture or acute bony findings. Soft tissues and spinal canal: Bilateral common carotid atherosclerotic calcification. Disc levels: Mild left foraminal impingement at C2-3 and C3-4, and mild right foraminal impingement at C3-4, C4-5, and C5-6, due to uncinate and facet spurring. Moderate central narrowing of the thecal sac at C3-4 due to central disc protrusion and intervertebral spurring. Upper chest: Unremarkable Other: No supplemental non-categorized findings. IMPRESSION: 1. No acute intracranial findings and no acute cervical spine findings. 2. Cerebral atrophy with associated ex vacuo  ventriculomegaly. 3. Periventricular white matter and corona radiata hypodensities favor chronic ischemic microvascular white matter disease. 4. Atherosclerosis. 5. Mild to moderate multilevel cervical impingement due to spondylosis and degenerative disc disease. Electronically Signed   By: Van Clines M.D.   On: 07/17/2021 15:45   CT Cervical Spine Wo Contrast  Result Date: 07/17/2021 CLINICAL DATA:  Multiple abrasions and scratches, patient found wandering, ataxia, head trauma. EXAM: CT HEAD WITHOUT CONTRAST CT CERVICAL SPINE WITHOUT CONTRAST TECHNIQUE: Multidetector CT imaging of the head and cervical spine was performed following the standard protocol without intravenous contrast. Multiplanar CT image reconstructions of the cervical spine were also generated. RADIATION DOSE REDUCTION: This exam was performed according to the departmental dose-optimization program which includes automated exposure control, adjustment of the mA and/or kV according to patient size and/or use of iterative reconstruction technique. COMPARISON:  Report from 05/05/2017 FINDINGS: CT HEAD FINDINGS Brain: Atrophy with associated ventriculomegaly including the temporal horns, likely ex vacuo. Periventricular white matter and corona radiata hypodensities favor chronic ischemic microvascular white matter disease. Otherwise, the brainstem, cerebellum, cerebral peduncles, thalamus, basal ganglia, basilar cisterns, and ventricular system appear within normal limits. No intracranial hemorrhage, mass lesion, or acute CVA. Vascular: There is atherosclerotic calcification of the cavernous carotid arteries bilaterally. Skull: Unremarkable Sinuses/Orbits: Chronic ethmoid sinusitis. Other: No supplemental non-categorized findings. CT CERVICAL SPINE FINDINGS Alignment: No vertebral subluxation is observed. Skull base and vertebrae: No fracture or acute bony findings. Soft tissues and spinal canal: Bilateral common carotid atherosclerotic  calcification. Disc levels: Mild left foraminal impingement at C2-3 and C3-4, and mild right foraminal impingement at C3-4, C4-5, and C5-6, due to uncinate and facet spurring. Moderate central narrowing of the thecal sac at C3-4 due to central disc protrusion and intervertebral spurring. Upper chest: Unremarkable Other: No supplemental non-categorized findings. IMPRESSION: 1. No acute intracranial findings and no acute cervical spine findings. 2. Cerebral atrophy with associated ex vacuo ventriculomegaly. 3. Periventricular white matter and corona radiata hypodensities favor chronic ischemic microvascular white matter disease. 4. Atherosclerosis. 5. Mild to moderate multilevel cervical impingement due to spondylosis and degenerative disc disease. Electronically Signed   By: Van Clines M.D.   On: 07/17/2021 15:45    Microbiology: Results for orders placed or performed in visit on 04/27/20  Microscopic Examination     Status: Abnormal   Collection Time: 04/27/20  2:11 PM   Urine  Result Value Ref Range Status   WBC, UA 0-5 0 - 5 /hpf Final   RBC, Urine 0-2 0 - 2 /hpf Final   Epithelial Cells (non renal) None seen 0 - 10 /hpf Final   Renal Epithel, UA None seen None seen /hpf Final   Mucus, UA Present Not Estab. Final   Bacteria, UA  Few (A) None seen/Few Final    Labs: CBC: Recent Labs  Lab 07/17/21 1445 07/18/21 0612 07/19/21 0601  WBC 11.8* 10.1 9.9  NEUTROABS 9.4*  --  7.0  HGB 14.9 13.8 13.3  HCT 43.8 39.7 39.3  MCV 88.8 89.0 89.7  PLT 220 204 975   Basic Metabolic Panel: Recent Labs  Lab 07/17/21 1445 07/17/21 2059 07/18/21 0612 07/19/21 0601  NA 142  --  140 140  K 2.8*  --  3.7 3.5  CL 104  --  109 112*  CO2 25  --  23 24  GLUCOSE 102*  --  97 96  BUN 23  --  19 16  CREATININE 1.58*  --  1.25* 1.17  CALCIUM 9.4  --  8.3* 8.4*  MG  --  2.5* 2.4  --    Liver Function Tests: Recent Labs  Lab 07/17/21 1445  AST 24  ALT 18  ALKPHOS 36*  BILITOT 1.5*  PROT  7.0  ALBUMIN 3.9   CBG: No results for input(s): "GLUCAP" in the last 168 hours.  Discharge time spent: 38 minutes  Signed: Hosie Poisson, MD [facility] 07/21/2021

## 2021-07-25 ENCOUNTER — Ambulatory Visit: Payer: Medicare PPO | Admitting: Cardiology

## 2021-07-25 ENCOUNTER — Encounter: Payer: Self-pay | Admitting: Cardiology

## 2021-07-25 VITALS — BP 138/86 | HR 62 | Ht 72.0 in | Wt 202.4 lb

## 2021-07-25 DIAGNOSIS — I10 Essential (primary) hypertension: Secondary | ICD-10-CM | POA: Diagnosis not present

## 2021-07-25 DIAGNOSIS — I25119 Atherosclerotic heart disease of native coronary artery with unspecified angina pectoris: Secondary | ICD-10-CM | POA: Diagnosis not present

## 2021-07-25 DIAGNOSIS — Z299 Encounter for prophylactic measures, unspecified: Secondary | ICD-10-CM | POA: Diagnosis not present

## 2021-07-25 DIAGNOSIS — F0280 Dementia in other diseases classified elsewhere without behavioral disturbance: Secondary | ICD-10-CM | POA: Diagnosis not present

## 2021-07-25 DIAGNOSIS — G309 Alzheimer's disease, unspecified: Secondary | ICD-10-CM | POA: Diagnosis not present

## 2021-07-25 DIAGNOSIS — Z6829 Body mass index (BMI) 29.0-29.9, adult: Secondary | ICD-10-CM | POA: Diagnosis not present

## 2021-07-25 DIAGNOSIS — R001 Bradycardia, unspecified: Secondary | ICD-10-CM

## 2021-07-25 NOTE — Patient Instructions (Signed)

## 2021-08-30 DIAGNOSIS — C4442 Squamous cell carcinoma of skin of scalp and neck: Secondary | ICD-10-CM | POA: Diagnosis not present

## 2021-09-02 ENCOUNTER — Encounter (HOSPITAL_COMMUNITY): Payer: Self-pay

## 2021-09-02 ENCOUNTER — Emergency Department (HOSPITAL_COMMUNITY): Payer: Medicare PPO

## 2021-09-02 ENCOUNTER — Other Ambulatory Visit: Payer: Self-pay

## 2021-09-02 ENCOUNTER — Emergency Department (HOSPITAL_COMMUNITY)
Admission: EM | Admit: 2021-09-02 | Discharge: 2021-09-03 | Disposition: A | Discharge status: Home / Self Care | Payer: Medicare PPO | Attending: Emergency Medicine | Admitting: Emergency Medicine

## 2021-09-02 DIAGNOSIS — M8580 Other specified disorders of bone density and structure, unspecified site: Secondary | ICD-10-CM | POA: Diagnosis not present

## 2021-09-02 DIAGNOSIS — K5790 Diverticulosis of intestine, part unspecified, without perforation or abscess without bleeding: Secondary | ICD-10-CM | POA: Insufficient documentation

## 2021-09-02 DIAGNOSIS — I2510 Atherosclerotic heart disease of native coronary artery without angina pectoris: Secondary | ICD-10-CM | POA: Diagnosis not present

## 2021-09-02 DIAGNOSIS — I714 Abdominal aortic aneurysm, without rupture, unspecified: Secondary | ICD-10-CM

## 2021-09-02 DIAGNOSIS — Z7982 Long term (current) use of aspirin: Secondary | ICD-10-CM | POA: Insufficient documentation

## 2021-09-02 DIAGNOSIS — Z79899 Other long term (current) drug therapy: Secondary | ICD-10-CM | POA: Insufficient documentation

## 2021-09-02 DIAGNOSIS — N200 Calculus of kidney: Secondary | ICD-10-CM | POA: Diagnosis not present

## 2021-09-02 DIAGNOSIS — R079 Chest pain, unspecified: Secondary | ICD-10-CM | POA: Diagnosis not present

## 2021-09-02 DIAGNOSIS — N179 Acute kidney failure, unspecified: Secondary | ICD-10-CM | POA: Diagnosis not present

## 2021-09-02 DIAGNOSIS — E039 Hypothyroidism, unspecified: Secondary | ICD-10-CM | POA: Insufficient documentation

## 2021-09-02 DIAGNOSIS — F0390 Unspecified dementia without behavioral disturbance: Secondary | ICD-10-CM | POA: Diagnosis not present

## 2021-09-02 DIAGNOSIS — K429 Umbilical hernia without obstruction or gangrene: Secondary | ICD-10-CM | POA: Diagnosis not present

## 2021-09-02 DIAGNOSIS — K5730 Diverticulosis of large intestine without perforation or abscess without bleeding: Secondary | ICD-10-CM | POA: Diagnosis not present

## 2021-09-02 DIAGNOSIS — N400 Enlarged prostate without lower urinary tract symptoms: Secondary | ICD-10-CM | POA: Diagnosis not present

## 2021-09-02 DIAGNOSIS — I10 Essential (primary) hypertension: Secondary | ICD-10-CM | POA: Insufficient documentation

## 2021-09-02 DIAGNOSIS — I77811 Abdominal aortic ectasia: Secondary | ICD-10-CM | POA: Diagnosis not present

## 2021-09-02 LAB — HEPATIC FUNCTION PANEL
ALT: 22 U/L (ref 0–44)
AST: 24 U/L (ref 15–41)
Albumin: 4.5 g/dL (ref 3.5–5.0)
Alkaline Phosphatase: 38 U/L (ref 38–126)
Bilirubin, Direct: 0.3 mg/dL — ABNORMAL HIGH (ref 0.0–0.2)
Indirect Bilirubin: 0.7 mg/dL (ref 0.3–0.9)
Total Bilirubin: 1 mg/dL (ref 0.3–1.2)
Total Protein: 7.8 g/dL (ref 6.5–8.1)

## 2021-09-02 LAB — URINALYSIS, ROUTINE W REFLEX MICROSCOPIC
Bilirubin Urine: NEGATIVE
Glucose, UA: NEGATIVE mg/dL
Hgb urine dipstick: NEGATIVE
Ketones, ur: NEGATIVE mg/dL
Leukocytes,Ua: NEGATIVE
Nitrite: NEGATIVE
Protein, ur: NEGATIVE mg/dL
Specific Gravity, Urine: 1.013 (ref 1.005–1.030)
pH: 5 (ref 5.0–8.0)

## 2021-09-02 LAB — CBC
HCT: 47.6 % (ref 39.0–52.0)
Hemoglobin: 15.9 g/dL (ref 13.0–17.0)
MCH: 30.3 pg (ref 26.0–34.0)
MCHC: 33.4 g/dL (ref 30.0–36.0)
MCV: 90.7 fL (ref 80.0–100.0)
Platelets: 223 10*3/uL (ref 150–400)
RBC: 5.25 MIL/uL (ref 4.22–5.81)
RDW: 12.8 % (ref 11.5–15.5)
WBC: 9.6 10*3/uL (ref 4.0–10.5)
nRBC: 0 % (ref 0.0–0.2)

## 2021-09-02 LAB — TROPONIN I (HIGH SENSITIVITY)
Troponin I (High Sensitivity): 31 ng/L — ABNORMAL HIGH (ref <18)
Troponin I (High Sensitivity): 31 ng/L — ABNORMAL HIGH (ref <18)

## 2021-09-02 LAB — BASIC METABOLIC PANEL
Anion gap: 11 (ref 5–15)
BUN: 29 mg/dL — ABNORMAL HIGH (ref 8–23)
CO2: 22 mmol/L (ref 22–32)
Calcium: 9.5 mg/dL (ref 8.9–10.3)
Chloride: 105 mmol/L (ref 98–111)
Creatinine, Ser: 2.03 mg/dL — ABNORMAL HIGH (ref 0.61–1.24)
GFR, Estimated: 32 mL/min — ABNORMAL LOW (ref >60)
Glucose, Bld: 110 mg/dL — ABNORMAL HIGH (ref 70–99)
Potassium: 3.7 mmol/L (ref 3.5–5.1)
Sodium: 138 mmol/L (ref 135–145)

## 2021-09-02 LAB — LIPASE, BLOOD: Lipase: 33 U/L (ref 11–51)

## 2021-09-02 MED ORDER — SODIUM CHLORIDE 0.9 % IV SOLN
1000.0000 mL | INTRAVENOUS | Status: DC
  Administered 2021-09-03: 1000 mL via INTRAVENOUS

## 2021-09-02 MED ORDER — SODIUM CHLORIDE 0.9 % IV BOLUS
1000.0000 mL | Freq: Once | INTRAVENOUS | Status: AC
  Administered 2021-09-03: 1000 mL via INTRAVENOUS

## 2021-09-02 MED ORDER — SODIUM CHLORIDE 0.9 % IV BOLUS (SEPSIS)
500.0000 mL | Freq: Once | INTRAVENOUS | Status: AC
  Administered 2021-09-03: 500 mL via INTRAVENOUS

## 2021-09-02 NOTE — ED Notes (Signed)
Pt to ct 

## 2021-09-02 NOTE — ED Triage Notes (Signed)
Pov from home with with wife. Pt has no complaints now but she says that while they were eating dinner in [place] he was complaining of right flank pain. But prior to that he was complaining of back pain the day before. On the way here she said that he said he feels sick and said that he had some chest pain. However all pain has resolved and has no complaints according tot he patient.  Pt is a poor historian r/t dementia.

## 2021-09-02 NOTE — ED Provider Notes (Signed)
[facility] EMERGENCY DEPARTMENT Provider Note   CSN: [id number] Arrival date & time: 09/02/21  1912     History  Chief Complaint  Patient presents with   Chest Pain    Cole Mayer is a 86 y.o. male.   Chest Pain    Patient has a history of hyperlipidemia, coronary artery disease, myocardial infarction, hypothyroidism, hypertension and dementia.  Patient did not have much appetite today.  He has had some loose stools.  Wife states they went out to dinner with family this evening.  While he was there eating dinner he had an episode of appearing very pale.  Patient states he was not feeling well.  He states told his wife he needed to see his doctor.  He initially was complaining some pain in his flank that had been going down into his lower abdomen.  On the way over he started also complaining of some chest discomfort.  Although symptoms have now resolved at this point.  Patient denies any complaints.  Due to his dementia he does not recall having those complaints earlier  Home Medications Prior to Admission medications   Medication Sig Start Date End Date Taking? Authorizing Provider  aspirin EC 81 MG tablet Take 81 mg by mouth daily.    [provider]  ipratropium (ATROVENT) 0.06 % nasal spray  11/30/19   [provider]  levothyroxine (SYNTHROID, LEVOTHROID) 112 MCG tablet Take 112 mcg by mouth daily before breakfast.     [provider]  LORazepam (ATIVAN) 0.5 MG tablet Take 0.5 mg by mouth daily as needed for anxiety or sedation. 04/03/21   [provider]  memantine (NAMENDA XR) 28 MG CP24 24 hr capsule Take 28 mg by mouth daily. 03/09/21   [provider]  nitroGLYCERIN (NITROSTAT) 0.4 MG SL tablet Place 1 tablet (0.4 mg total) under the tongue every 5 (five) minutes x 3 doses as needed for chest pain (if no relief after 2nd dose, proceed to ED or call 911). Up to 3 doses. 04/24/21   Satira Sark, MD  polyethylene glycol (MIRALAX  / GLYCOLAX) 17 g packet Take 17 g by mouth daily as needed for mild constipation. 07/19/21   Hosie Poisson, MD  ramipril (ALTACE) 10 MG capsule Take 10 mg by mouth daily.    [provider]  simvastatin (ZOCOR) 40 MG tablet Take 40 mg by mouth daily.    [provider]      Allergies    Patient has no known allergies.    Review of Systems   Review of Systems  Cardiovascular:  Positive for chest pain.    Physical Exam Updated Vital Signs BP 138/72   Pulse 61   Temp 97.6 F (36.4 C)   Resp 20   Ht 1.829 m (6')   Wt 92 kg   SpO2 99%   BMI 27.51 kg/m  Physical Exam Vitals and nursing note reviewed.  Constitutional:      General: He is not in acute distress.    Appearance: He is well-developed.  HENT:     Head: Normocephalic and atraumatic.     Right Ear: External ear normal.     Left Ear: External ear normal.  Eyes:     General: No scleral icterus.       Right eye: No discharge.        Left eye: No discharge.     Conjunctiva/sclera: Conjunctivae normal.  Neck:     Trachea: No tracheal  deviation.  Cardiovascular:     Rate and Rhythm: Normal rate and regular rhythm.  Pulmonary:     Effort: Pulmonary effort is normal. No respiratory distress.     Breath sounds: Normal breath sounds. No stridor. No wheezing or rales.  Abdominal:     General: Bowel sounds are normal. There is no distension.     Palpations: Abdomen is soft.     Tenderness: There is no abdominal tenderness. There is no guarding or rebound.  Musculoskeletal:        General: No tenderness or deformity.     Cervical back: Neck supple.  Skin:    General: Skin is warm and dry.     Findings: No rash.  Neurological:     General: No focal deficit present.     Mental Status: He is alert.     Cranial Nerves: No cranial nerve deficit (no facial droop, extraocular movements intact, no slurred speech).     Sensory: No sensory deficit.     Motor: No abnormal muscle tone or seizure activity.      Coordination: Coordination normal.  Psychiatric:        Mood and Affect: Mood normal.     ED Results / Procedures / Treatments   Labs (all labs ordered are listed, but only abnormal results are displayed) Labs Reviewed  BASIC METABOLIC PANEL - Abnormal; Notable for the following components:      Result Value   Glucose, Bld 110 (*)    BUN 29 (*)    Creatinine, Ser 2.03 (*)    GFR, Estimated 32 (*)    All other components within normal limits  HEPATIC FUNCTION PANEL - Abnormal; Notable for the following components:   Bilirubin, Direct 0.3 (*)    All other components within normal limits  TROPONIN I (HIGH SENSITIVITY) - Abnormal; Notable for the following components:   Troponin I (High Sensitivity) 31 (*)    All other components within normal limits  TROPONIN I (HIGH SENSITIVITY) - Abnormal; Notable for the following components:   Troponin I (High Sensitivity) 31 (*)    All other components within normal limits  CBC  LIPASE, BLOOD  URINALYSIS, ROUTINE W REFLEX MICROSCOPIC    EKG EKG Interpretation  Date/Time:  Sunday September 02 2021 19:34:11 EDT Ventricular Rate:  77 PR Interval:    QRS Duration: 116 QT Interval:  428 QTC Calculation: 484 R Axis:   98 Text Interpretation: Atrial flutter with variable A-V block Incomplete right bundle branch block Right ventricular hypertrophy with repolarization abnormality Possible Inferior infarct (cited on or before 18-Jul-2021) Abnormal ECG When compared with ECG of 18-Jul-2021 13:31, Atrial flutter has replaced Sinus rhythm Questionable change in initial forces of Posterior leads T wave inversion now evident in Anterior leads Confirmed by Dorie Rank [phone]) on 09/02/2021 7:46:30 PM  Radiology DG Chest 2 View  Result Date: 09/02/2021 CLINICAL DATA:  Chest pain EXAM: CHEST - 2 VIEW COMPARISON:  07/17/2021 FINDINGS: Cardiac shadow is stable in appearance. Mild aortic calcifications are seen. Lungs are well aerated bilaterally. No focal  infiltrate or effusion is seen. Degenerative changes of the thoracic spine are noted. IMPRESSION: No active cardiopulmonary disease. Electronically Signed   By: Inez Catalina M.D.   On: 09/02/2021 19:52    Procedures Procedures    Medications Ordered in ED Medications  sodium chloride 0.9 % bolus 500 mL (has no administration in time range)    Followed by  0.9 %  sodium chloride infusion (has no  administration in time range)  sodium chloride 0.9 % bolus 1,000 mL (has no administration in time range)    ED Course/ Medical Decision Making/ A&P Clinical Course as of 09/02/21 2359  Sun Sep 02, 2021  2101 CBC Normal [JK]  2102 DG Chest 2 View Chest x-ray without acute findings [JK]  2326 Urinalysis, Routine w reflex microscopic Normal [JK]  2326 Lipase, blood [JK]  2327 Normal [JK]  2327 Hepatic function panel(!) Normal [JK]  2327 CBC Normal [JK]  3151 Basic metabolic panel(!) Creatinine elevated compared to previous [JK]  2327 Troponin I (High Sensitivity)(!) Troponin elevated but decreased compared to previous [JK]    Clinical Course User Index [JK] Dorie Rank, MD                           Medical Decision Making Amount and/or Complexity of Data Reviewed Labs: ordered. Decision-making details documented in ED Course. Radiology: ordered. Decision-making details documented in ED Course.  Risk Prescription drug management.   Patient presented to the ED for evaluation of flank pain as well as chest discomfort.  Patient has history of dementia and he does not recall the complaints but his wife is here and able to provide the history.  Initially started with nausea and flank pain but then had some chest discomfort on the way over.  EKG is reassuring.  Initial troponin is elevated but it is decreased compared to previous values and the delta is unchanged.  I doubt he is having an acute coronary event.  Patient does not have any abdominal tenderness right now but he does have an  elevated creatinine.  We will proceed with IV fluid boluses.  Will CT to evaluate for any obstructive process.  Patient remains asymptomatic.  We will plan on reassessing after the CT scan.  Care turned over to Dr Waverly Ferrari.        Final Clinical Impression(s) / ED Diagnoses Final diagnoses:  AKI (acute kidney injury) [facility])    Rx / DC Orders ED Discharge Orders     None         Dorie Rank, MD 09/02/21 2359

## 2021-09-03 NOTE — ED Provider Notes (Signed)
Patient signed out to me by Dr. Tomi Bamberger.  He initially presented with flank pain and then developed some chest pain.  His troponins have been cycled and are flat, reassuring, at baseline.  No further chest pain.  CT abdomen and pelvis was performed.  There were incidental findings including abdominal aortic aneurysm and calcifications but no acute findings.  We will refer back to primary care for monitoring.  Patient noted to have an acute kidney injury.  Given IV fluids here and has done well.  He has a follow-up appoint with his PCP in 3 days, will provide him with his labs and x-ray reports to bring with him to his primary doctor.   [name], MD 09/03/21 0200

## 2021-09-05 DIAGNOSIS — F0390 Unspecified dementia without behavioral disturbance: Secondary | ICD-10-CM | POA: Diagnosis not present

## 2021-09-05 DIAGNOSIS — Z0000 Encounter for general adult medical examination without abnormal findings: Secondary | ICD-10-CM | POA: Diagnosis not present

## 2021-09-05 DIAGNOSIS — I739 Peripheral vascular disease, unspecified: Secondary | ICD-10-CM | POA: Diagnosis not present

## 2021-09-05 DIAGNOSIS — I10 Essential (primary) hypertension: Secondary | ICD-10-CM | POA: Diagnosis not present

## 2021-09-05 DIAGNOSIS — I25119 Atherosclerotic heart disease of native coronary artery with unspecified angina pectoris: Secondary | ICD-10-CM | POA: Diagnosis not present

## 2021-09-05 DIAGNOSIS — Z299 Encounter for prophylactic measures, unspecified: Secondary | ICD-10-CM | POA: Diagnosis not present

## 2021-09-12 DIAGNOSIS — G309 Alzheimer's disease, unspecified: Secondary | ICD-10-CM | POA: Diagnosis not present

## 2021-09-12 DIAGNOSIS — I10 Essential (primary) hypertension: Secondary | ICD-10-CM | POA: Diagnosis not present

## 2021-09-12 DIAGNOSIS — Z6827 Body mass index (BMI) 27.0-27.9, adult: Secondary | ICD-10-CM | POA: Diagnosis not present

## 2021-09-12 DIAGNOSIS — R059 Cough, unspecified: Secondary | ICD-10-CM | POA: Diagnosis not present

## 2021-09-12 DIAGNOSIS — F0280 Dementia in other diseases classified elsewhere without behavioral disturbance: Secondary | ICD-10-CM | POA: Diagnosis not present

## 2021-09-12 DIAGNOSIS — Z299 Encounter for prophylactic measures, unspecified: Secondary | ICD-10-CM | POA: Diagnosis not present

## 2021-09-12 DIAGNOSIS — Z111 Encounter for screening for respiratory tuberculosis: Secondary | ICD-10-CM | POA: Diagnosis not present

## 2021-09-14 DIAGNOSIS — R443 Hallucinations, unspecified: Secondary | ICD-10-CM | POA: Diagnosis not present

## 2021-09-14 DIAGNOSIS — Z20822 Contact with and (suspected) exposure to covid-19: Secondary | ICD-10-CM | POA: Diagnosis not present

## 2021-09-14 DIAGNOSIS — F03911 Unspecified dementia, unspecified severity, with agitation: Secondary | ICD-10-CM | POA: Diagnosis not present

## 2021-09-14 DIAGNOSIS — I259 Chronic ischemic heart disease, unspecified: Secondary | ICD-10-CM | POA: Diagnosis not present

## 2021-09-14 DIAGNOSIS — R1311 Dysphagia, oral phase: Secondary | ICD-10-CM | POA: Diagnosis not present

## 2021-09-14 DIAGNOSIS — R456 Violent behavior: Secondary | ICD-10-CM | POA: Diagnosis not present

## 2021-09-14 DIAGNOSIS — G319 Degenerative disease of nervous system, unspecified: Secondary | ICD-10-CM | POA: Diagnosis not present

## 2021-09-14 DIAGNOSIS — K6389 Other specified diseases of intestine: Secondary | ICD-10-CM | POA: Diagnosis not present

## 2021-09-14 DIAGNOSIS — R45850 Homicidal ideations: Secondary | ICD-10-CM | POA: Diagnosis not present

## 2021-09-14 DIAGNOSIS — K5730 Diverticulosis of large intestine without perforation or abscess without bleeding: Secondary | ICD-10-CM | POA: Diagnosis not present

## 2021-09-14 DIAGNOSIS — F419 Anxiety disorder, unspecified: Secondary | ICD-10-CM | POA: Diagnosis not present

## 2021-09-14 DIAGNOSIS — K6289 Other specified diseases of anus and rectum: Secondary | ICD-10-CM | POA: Diagnosis not present

## 2021-09-14 DIAGNOSIS — G4700 Insomnia, unspecified: Secondary | ICD-10-CM | POA: Diagnosis not present

## 2021-09-14 DIAGNOSIS — Z046 Encounter for general psychiatric examination, requested by authority: Secondary | ICD-10-CM | POA: Diagnosis not present

## 2021-09-14 DIAGNOSIS — N400 Enlarged prostate without lower urinary tract symptoms: Secondary | ICD-10-CM | POA: Diagnosis not present

## 2021-09-14 DIAGNOSIS — I2510 Atherosclerotic heart disease of native coronary artery without angina pectoris: Secondary | ICD-10-CM | POA: Diagnosis not present

## 2021-09-14 DIAGNOSIS — R451 Restlessness and agitation: Secondary | ICD-10-CM | POA: Diagnosis not present

## 2021-09-14 DIAGNOSIS — M25561 Pain in right knee: Secondary | ICD-10-CM | POA: Diagnosis not present

## 2021-09-14 DIAGNOSIS — I10 Essential (primary) hypertension: Secondary | ICD-10-CM | POA: Diagnosis not present

## 2021-09-14 DIAGNOSIS — R413 Other amnesia: Secondary | ICD-10-CM | POA: Diagnosis not present

## 2021-09-22 DIAGNOSIS — R451 Restlessness and agitation: Secondary | ICD-10-CM | POA: Diagnosis not present

## 2021-09-22 DIAGNOSIS — F22 Delusional disorders: Secondary | ICD-10-CM | POA: Diagnosis not present

## 2021-09-22 DIAGNOSIS — G4700 Insomnia, unspecified: Secondary | ICD-10-CM | POA: Diagnosis not present

## 2021-09-22 DIAGNOSIS — F419 Anxiety disorder, unspecified: Secondary | ICD-10-CM | POA: Diagnosis not present

## 2021-09-23 DIAGNOSIS — N400 Enlarged prostate without lower urinary tract symptoms: Secondary | ICD-10-CM | POA: Diagnosis not present

## 2021-09-23 DIAGNOSIS — K5730 Diverticulosis of large intestine without perforation or abscess without bleeding: Secondary | ICD-10-CM | POA: Diagnosis not present

## 2021-09-23 DIAGNOSIS — K6389 Other specified diseases of intestine: Secondary | ICD-10-CM | POA: Diagnosis not present

## 2021-09-23 DIAGNOSIS — K6289 Other specified diseases of anus and rectum: Secondary | ICD-10-CM | POA: Diagnosis not present

## 2021-09-23 DIAGNOSIS — M25561 Pain in right knee: Secondary | ICD-10-CM | POA: Diagnosis not present

## 2021-09-30 DIAGNOSIS — G8929 Other chronic pain: Secondary | ICD-10-CM | POA: Diagnosis not present

## 2021-09-30 DIAGNOSIS — R9431 Abnormal electrocardiogram [ECG] [EKG]: Secondary | ICD-10-CM | POA: Diagnosis not present

## 2021-09-30 DIAGNOSIS — M47812 Spondylosis without myelopathy or radiculopathy, cervical region: Secondary | ICD-10-CM | POA: Diagnosis not present

## 2021-09-30 DIAGNOSIS — Z7401 Bed confinement status: Secondary | ICD-10-CM | POA: Diagnosis not present

## 2021-09-30 DIAGNOSIS — K6289 Other specified diseases of anus and rectum: Secondary | ICD-10-CM | POA: Diagnosis not present

## 2021-09-30 DIAGNOSIS — R1312 Dysphagia, oropharyngeal phase: Secondary | ICD-10-CM | POA: Diagnosis not present

## 2021-09-30 DIAGNOSIS — R293 Abnormal posture: Secondary | ICD-10-CM | POA: Diagnosis not present

## 2021-09-30 DIAGNOSIS — D72829 Elevated white blood cell count, unspecified: Secondary | ICD-10-CM | POA: Diagnosis not present

## 2021-09-30 DIAGNOSIS — I2510 Atherosclerotic heart disease of native coronary artery without angina pectoris: Secondary | ICD-10-CM | POA: Diagnosis not present

## 2021-09-30 DIAGNOSIS — R0689 Other abnormalities of breathing: Secondary | ICD-10-CM | POA: Diagnosis not present

## 2021-09-30 DIAGNOSIS — M6281 Muscle weakness (generalized): Secondary | ICD-10-CM | POA: Diagnosis not present

## 2021-09-30 DIAGNOSIS — Z792 Long term (current) use of antibiotics: Secondary | ICD-10-CM | POA: Diagnosis not present

## 2021-09-30 DIAGNOSIS — G9341 Metabolic encephalopathy: Secondary | ICD-10-CM | POA: Diagnosis not present

## 2021-09-30 DIAGNOSIS — N3000 Acute cystitis without hematuria: Secondary | ICD-10-CM | POA: Diagnosis not present

## 2021-09-30 DIAGNOSIS — E860 Dehydration: Secondary | ICD-10-CM | POA: Diagnosis not present

## 2021-09-30 DIAGNOSIS — S0990XA Unspecified injury of head, initial encounter: Secondary | ICD-10-CM | POA: Diagnosis not present

## 2021-09-30 DIAGNOSIS — R1310 Dysphagia, unspecified: Secondary | ICD-10-CM | POA: Diagnosis not present

## 2021-09-30 DIAGNOSIS — E785 Hyperlipidemia, unspecified: Secondary | ICD-10-CM | POA: Diagnosis not present

## 2021-09-30 DIAGNOSIS — R279 Unspecified lack of coordination: Secondary | ICD-10-CM | POA: Diagnosis not present

## 2021-09-30 DIAGNOSIS — I714 Abdominal aortic aneurysm, without rupture, unspecified: Secondary | ICD-10-CM | POA: Diagnosis not present

## 2021-09-30 DIAGNOSIS — F0280 Dementia in other diseases classified elsewhere without behavioral disturbance: Secondary | ICD-10-CM | POA: Diagnosis not present

## 2021-09-30 DIAGNOSIS — R4182 Altered mental status, unspecified: Secondary | ICD-10-CM | POA: Diagnosis present

## 2021-09-30 DIAGNOSIS — R41841 Cognitive communication deficit: Secondary | ICD-10-CM | POA: Diagnosis not present

## 2021-09-30 DIAGNOSIS — N179 Acute kidney failure, unspecified: Secondary | ICD-10-CM | POA: Diagnosis not present

## 2021-09-30 DIAGNOSIS — E039 Hypothyroidism, unspecified: Secondary | ICD-10-CM | POA: Diagnosis not present

## 2021-09-30 DIAGNOSIS — F0390 Unspecified dementia without behavioral disturbance: Secondary | ICD-10-CM | POA: Diagnosis not present

## 2021-09-30 DIAGNOSIS — E440 Moderate protein-calorie malnutrition: Secondary | ICD-10-CM | POA: Diagnosis not present

## 2021-09-30 DIAGNOSIS — I700 Atherosclerosis of aorta: Secondary | ICD-10-CM | POA: Diagnosis not present

## 2021-09-30 DIAGNOSIS — R918 Other nonspecific abnormal finding of lung field: Secondary | ICD-10-CM | POA: Diagnosis not present

## 2021-09-30 DIAGNOSIS — R2689 Other abnormalities of gait and mobility: Secondary | ICD-10-CM | POA: Diagnosis not present

## 2021-09-30 DIAGNOSIS — K5730 Diverticulosis of large intestine without perforation or abscess without bleeding: Secondary | ICD-10-CM | POA: Diagnosis not present

## 2021-09-30 DIAGNOSIS — I252 Old myocardial infarction: Secondary | ICD-10-CM | POA: Diagnosis not present

## 2021-09-30 DIAGNOSIS — R296 Repeated falls: Secondary | ICD-10-CM | POA: Diagnosis not present

## 2021-09-30 DIAGNOSIS — W19XXXA Unspecified fall, initial encounter: Secondary | ICD-10-CM | POA: Diagnosis not present

## 2021-09-30 DIAGNOSIS — E46 Unspecified protein-calorie malnutrition: Secondary | ICD-10-CM | POA: Diagnosis not present

## 2021-09-30 DIAGNOSIS — Y92129 Unspecified place in nursing home as the place of occurrence of the external cause: Secondary | ICD-10-CM | POA: Diagnosis not present

## 2021-09-30 DIAGNOSIS — I10 Essential (primary) hypertension: Secondary | ICD-10-CM | POA: Diagnosis not present

## 2021-09-30 DIAGNOSIS — G309 Alzheimer's disease, unspecified: Secondary | ICD-10-CM | POA: Diagnosis not present

## 2021-09-30 DIAGNOSIS — F03C0 Unspecified dementia, severe, without behavioral disturbance, psychotic disturbance, mood disturbance, and anxiety: Secondary | ICD-10-CM | POA: Diagnosis not present

## 2021-09-30 DIAGNOSIS — Z7982 Long term (current) use of aspirin: Secondary | ICD-10-CM | POA: Diagnosis not present

## 2021-09-30 DIAGNOSIS — Z043 Encounter for examination and observation following other accident: Secondary | ICD-10-CM | POA: Diagnosis not present

## 2021-09-30 DIAGNOSIS — I7143 Infrarenal abdominal aortic aneurysm, without rupture: Secondary | ICD-10-CM | POA: Diagnosis not present

## 2021-10-01 DIAGNOSIS — K5730 Diverticulosis of large intestine without perforation or abscess without bleeding: Secondary | ICD-10-CM | POA: Diagnosis not present

## 2021-10-01 DIAGNOSIS — K6289 Other specified diseases of anus and rectum: Secondary | ICD-10-CM | POA: Diagnosis not present

## 2021-10-10 ENCOUNTER — Emergency Department (HOSPITAL_COMMUNITY)
Admission: EM | Admit: 2021-10-10 | Discharge: 2021-10-10 | Disposition: A | Discharge status: Skilled Nursing Facility | Payer: Medicare PPO | Attending: Emergency Medicine | Admitting: Emergency Medicine

## 2021-10-10 ENCOUNTER — Emergency Department (HOSPITAL_COMMUNITY): Payer: Medicare PPO

## 2021-10-10 ENCOUNTER — Encounter (HOSPITAL_COMMUNITY): Payer: Self-pay

## 2021-10-10 DIAGNOSIS — G9341 Metabolic encephalopathy: Secondary | ICD-10-CM | POA: Diagnosis not present

## 2021-10-10 DIAGNOSIS — R4182 Altered mental status, unspecified: Secondary | ICD-10-CM | POA: Diagnosis present

## 2021-10-10 DIAGNOSIS — Z79899 Other long term (current) drug therapy: Secondary | ICD-10-CM | POA: Diagnosis not present

## 2021-10-10 DIAGNOSIS — G319 Degenerative disease of nervous system, unspecified: Secondary | ICD-10-CM | POA: Diagnosis present

## 2021-10-10 DIAGNOSIS — Z7189 Other specified counseling: Secondary | ICD-10-CM | POA: Diagnosis not present

## 2021-10-10 DIAGNOSIS — E876 Hypokalemia: Secondary | ICD-10-CM | POA: Diagnosis present

## 2021-10-10 DIAGNOSIS — R296 Repeated falls: Secondary | ICD-10-CM | POA: Diagnosis not present

## 2021-10-10 DIAGNOSIS — S0990XD Unspecified injury of head, subsequent encounter: Secondary | ICD-10-CM | POA: Diagnosis not present

## 2021-10-10 DIAGNOSIS — S0990XA Unspecified injury of head, initial encounter: Secondary | ICD-10-CM | POA: Diagnosis not present

## 2021-10-10 DIAGNOSIS — E43 Unspecified severe protein-calorie malnutrition: Secondary | ICD-10-CM | POA: Diagnosis not present

## 2021-10-10 DIAGNOSIS — R531 Weakness: Secondary | ICD-10-CM | POA: Diagnosis not present

## 2021-10-10 DIAGNOSIS — F0394 Unspecified dementia, unspecified severity, with anxiety: Secondary | ICD-10-CM | POA: Diagnosis present

## 2021-10-10 DIAGNOSIS — R1310 Dysphagia, unspecified: Secondary | ICD-10-CM | POA: Diagnosis not present

## 2021-10-10 DIAGNOSIS — Z029 Encounter for administrative examinations, unspecified: Secondary | ICD-10-CM | POA: Diagnosis not present

## 2021-10-10 DIAGNOSIS — Z7401 Bed confinement status: Secondary | ICD-10-CM | POA: Diagnosis not present

## 2021-10-10 DIAGNOSIS — M47814 Spondylosis without myelopathy or radiculopathy, thoracic region: Secondary | ICD-10-CM | POA: Diagnosis not present

## 2021-10-10 DIAGNOSIS — N200 Calculus of kidney: Secondary | ICD-10-CM | POA: Diagnosis not present

## 2021-10-10 DIAGNOSIS — Z7982 Long term (current) use of aspirin: Secondary | ICD-10-CM | POA: Diagnosis not present

## 2021-10-10 DIAGNOSIS — F0392 Unspecified dementia, unspecified severity, with psychotic disturbance: Secondary | ICD-10-CM | POA: Diagnosis not present

## 2021-10-10 DIAGNOSIS — F5101 Primary insomnia: Secondary | ICD-10-CM | POA: Diagnosis not present

## 2021-10-10 DIAGNOSIS — Z515 Encounter for palliative care: Secondary | ICD-10-CM | POA: Diagnosis not present

## 2021-10-10 DIAGNOSIS — R5383 Other fatigue: Secondary | ICD-10-CM | POA: Diagnosis not present

## 2021-10-10 DIAGNOSIS — I10 Essential (primary) hypertension: Secondary | ICD-10-CM | POA: Diagnosis not present

## 2021-10-10 DIAGNOSIS — R9431 Abnormal electrocardiogram [ECG] [EKG]: Secondary | ICD-10-CM | POA: Diagnosis not present

## 2021-10-10 DIAGNOSIS — Y92129 Unspecified place in nursing home as the place of occurrence of the external cause: Secondary | ICD-10-CM | POA: Insufficient documentation

## 2021-10-10 DIAGNOSIS — U071 COVID-19: Secondary | ICD-10-CM | POA: Diagnosis present

## 2021-10-10 DIAGNOSIS — R293 Abnormal posture: Secondary | ICD-10-CM | POA: Diagnosis not present

## 2021-10-10 DIAGNOSIS — M19011 Primary osteoarthritis, right shoulder: Secondary | ICD-10-CM | POA: Diagnosis not present

## 2021-10-10 DIAGNOSIS — Z9189 Other specified personal risk factors, not elsewhere classified: Secondary | ICD-10-CM | POA: Diagnosis not present

## 2021-10-10 DIAGNOSIS — F0390 Unspecified dementia without behavioral disturbance: Secondary | ICD-10-CM | POA: Insufficient documentation

## 2021-10-10 DIAGNOSIS — R1312 Dysphagia, oropharyngeal phase: Secondary | ICD-10-CM | POA: Diagnosis not present

## 2021-10-10 DIAGNOSIS — Y929 Unspecified place or not applicable: Secondary | ICD-10-CM | POA: Diagnosis not present

## 2021-10-10 DIAGNOSIS — N390 Urinary tract infection, site not specified: Secondary | ICD-10-CM | POA: Diagnosis not present

## 2021-10-10 DIAGNOSIS — F03911 Unspecified dementia, unspecified severity, with agitation: Secondary | ICD-10-CM | POA: Diagnosis not present

## 2021-10-10 DIAGNOSIS — Z87448 Personal history of other diseases of urinary system: Secondary | ICD-10-CM | POA: Diagnosis not present

## 2021-10-10 DIAGNOSIS — R2681 Unsteadiness on feet: Secondary | ICD-10-CM | POA: Diagnosis not present

## 2021-10-10 DIAGNOSIS — M47812 Spondylosis without myelopathy or radiculopathy, cervical region: Secondary | ICD-10-CM | POA: Diagnosis not present

## 2021-10-10 DIAGNOSIS — E860 Dehydration: Secondary | ICD-10-CM | POA: Diagnosis present

## 2021-10-10 DIAGNOSIS — R7989 Other specified abnormal findings of blood chemistry: Secondary | ICD-10-CM | POA: Diagnosis not present

## 2021-10-10 DIAGNOSIS — J029 Acute pharyngitis, unspecified: Secondary | ICD-10-CM | POA: Diagnosis not present

## 2021-10-10 DIAGNOSIS — I2510 Atherosclerotic heart disease of native coronary artery without angina pectoris: Secondary | ICD-10-CM | POA: Insufficient documentation

## 2021-10-10 DIAGNOSIS — I672 Cerebral atherosclerosis: Secondary | ICD-10-CM | POA: Diagnosis not present

## 2021-10-10 DIAGNOSIS — R609 Edema, unspecified: Secondary | ICD-10-CM | POA: Diagnosis not present

## 2021-10-10 DIAGNOSIS — R41841 Cognitive communication deficit: Secondary | ICD-10-CM | POA: Diagnosis not present

## 2021-10-10 DIAGNOSIS — W19XXXA Unspecified fall, initial encounter: Secondary | ICD-10-CM | POA: Diagnosis not present

## 2021-10-10 DIAGNOSIS — E870 Hyperosmolality and hypernatremia: Secondary | ICD-10-CM | POA: Diagnosis not present

## 2021-10-10 DIAGNOSIS — I700 Atherosclerosis of aorta: Secondary | ICD-10-CM | POA: Diagnosis not present

## 2021-10-10 DIAGNOSIS — N1832 Chronic kidney disease, stage 3b: Secondary | ICD-10-CM | POA: Diagnosis present

## 2021-10-10 DIAGNOSIS — R5381 Other malaise: Secondary | ICD-10-CM | POA: Diagnosis not present

## 2021-10-10 DIAGNOSIS — I252 Old myocardial infarction: Secondary | ICD-10-CM | POA: Diagnosis not present

## 2021-10-10 DIAGNOSIS — E039 Hypothyroidism, unspecified: Secondary | ICD-10-CM | POA: Diagnosis present

## 2021-10-10 DIAGNOSIS — M19012 Primary osteoarthritis, left shoulder: Secondary | ICD-10-CM | POA: Diagnosis not present

## 2021-10-10 DIAGNOSIS — A419 Sepsis, unspecified organism: Secondary | ICD-10-CM | POA: Diagnosis not present

## 2021-10-10 DIAGNOSIS — R2689 Other abnormalities of gait and mobility: Secondary | ICD-10-CM | POA: Diagnosis not present

## 2021-10-10 DIAGNOSIS — R0689 Other abnormalities of breathing: Secondary | ICD-10-CM | POA: Diagnosis not present

## 2021-10-10 DIAGNOSIS — R7401 Elevation of levels of liver transaminase levels: Secondary | ICD-10-CM | POA: Diagnosis not present

## 2021-10-10 DIAGNOSIS — B370 Candidal stomatitis: Secondary | ICD-10-CM | POA: Diagnosis present

## 2021-10-10 DIAGNOSIS — I714 Abdominal aortic aneurysm, without rupture, unspecified: Secondary | ICD-10-CM | POA: Diagnosis not present

## 2021-10-10 DIAGNOSIS — I129 Hypertensive chronic kidney disease with stage 1 through stage 4 chronic kidney disease, or unspecified chronic kidney disease: Secondary | ICD-10-CM | POA: Diagnosis present

## 2021-10-10 DIAGNOSIS — B3789 Other sites of candidiasis: Secondary | ICD-10-CM | POA: Diagnosis present

## 2021-10-10 DIAGNOSIS — M6281 Muscle weakness (generalized): Secondary | ICD-10-CM | POA: Diagnosis not present

## 2021-10-10 DIAGNOSIS — N179 Acute kidney failure, unspecified: Secondary | ICD-10-CM | POA: Diagnosis present

## 2021-10-10 DIAGNOSIS — F0393 Unspecified dementia, unspecified severity, with mood disturbance: Secondary | ICD-10-CM | POA: Diagnosis not present

## 2021-10-10 DIAGNOSIS — Z955 Presence of coronary angioplasty implant and graft: Secondary | ICD-10-CM | POA: Diagnosis not present

## 2021-10-10 DIAGNOSIS — Z043 Encounter for examination and observation following other accident: Secondary | ICD-10-CM | POA: Diagnosis not present

## 2021-10-10 DIAGNOSIS — F03C0 Unspecified dementia, severe, without behavioral disturbance, psychotic disturbance, mood disturbance, and anxiety: Secondary | ICD-10-CM | POA: Diagnosis not present

## 2021-10-10 DIAGNOSIS — R638 Other symptoms and signs concerning food and fluid intake: Secondary | ICD-10-CM | POA: Diagnosis not present

## 2021-10-10 DIAGNOSIS — Z781 Physical restraint status: Secondary | ICD-10-CM | POA: Diagnosis not present

## 2021-10-10 DIAGNOSIS — N3000 Acute cystitis without hematuria: Secondary | ICD-10-CM | POA: Diagnosis not present

## 2021-10-10 DIAGNOSIS — Z66 Do not resuscitate: Secondary | ICD-10-CM | POA: Diagnosis present

## 2021-10-10 DIAGNOSIS — R918 Other nonspecific abnormal finding of lung field: Secondary | ICD-10-CM | POA: Diagnosis not present

## 2021-10-10 DIAGNOSIS — N1330 Unspecified hydronephrosis: Secondary | ICD-10-CM | POA: Diagnosis present

## 2021-10-10 DIAGNOSIS — F03C2 Unspecified dementia, severe, with psychotic disturbance: Secondary | ICD-10-CM | POA: Diagnosis not present

## 2021-10-10 DIAGNOSIS — Z9181 History of falling: Secondary | ICD-10-CM | POA: Diagnosis not present

## 2021-10-10 DIAGNOSIS — E782 Mixed hyperlipidemia: Secondary | ICD-10-CM | POA: Diagnosis present

## 2021-10-10 DIAGNOSIS — J0430 Supraglottitis, unspecified, without obstruction: Secondary | ICD-10-CM | POA: Diagnosis present

## 2021-10-10 DIAGNOSIS — G8929 Other chronic pain: Secondary | ICD-10-CM | POA: Diagnosis not present

## 2021-10-10 DIAGNOSIS — R279 Unspecified lack of coordination: Secondary | ICD-10-CM | POA: Diagnosis not present

## 2021-10-10 DIAGNOSIS — Z8249 Family history of ischemic heart disease and other diseases of the circulatory system: Secondary | ICD-10-CM | POA: Diagnosis not present

## 2021-10-10 DIAGNOSIS — R590 Localized enlarged lymph nodes: Secondary | ICD-10-CM | POA: Diagnosis not present

## 2021-10-10 DIAGNOSIS — F32A Depression, unspecified: Secondary | ICD-10-CM | POA: Diagnosis present

## 2021-10-10 LAB — URINALYSIS, ROUTINE W REFLEX MICROSCOPIC
Bilirubin Urine: NEGATIVE
Glucose, UA: NEGATIVE mg/dL
Hgb urine dipstick: NEGATIVE
Ketones, ur: 5 mg/dL — AB
Leukocytes,Ua: NEGATIVE
Nitrite: NEGATIVE
Protein, ur: NEGATIVE mg/dL
Specific Gravity, Urine: 1.012 (ref 1.005–1.030)
pH: 5 (ref 5.0–8.0)

## 2021-10-10 LAB — COMPREHENSIVE METABOLIC PANEL
ALT: 89 U/L — ABNORMAL HIGH (ref 0–44)
AST: 95 U/L — ABNORMAL HIGH (ref 15–41)
Albumin: 2.7 g/dL — ABNORMAL LOW (ref 3.5–5.0)
Alkaline Phosphatase: 56 U/L (ref 38–126)
Anion gap: 9 (ref 5–15)
BUN: 10 mg/dL (ref 8–23)
CO2: 20 mmol/L — ABNORMAL LOW (ref 22–32)
Calcium: 8.2 mg/dL — ABNORMAL LOW (ref 8.9–10.3)
Chloride: 106 mmol/L (ref 98–111)
Creatinine, Ser: 1.24 mg/dL (ref 0.61–1.24)
GFR, Estimated: 57 mL/min — ABNORMAL LOW (ref >60)
Glucose, Bld: 89 mg/dL (ref 70–99)
Potassium: 4.9 mmol/L (ref 3.5–5.1)
Sodium: 135 mmol/L (ref 135–145)
Total Bilirubin: 2.1 mg/dL — ABNORMAL HIGH (ref 0.3–1.2)
Total Protein: 6.3 g/dL — ABNORMAL LOW (ref 6.5–8.1)

## 2021-10-10 LAB — CBC WITH DIFFERENTIAL/PLATELET
Abs Immature Granulocytes: 0.16 10*3/uL — ABNORMAL HIGH (ref 0.00–0.07)
Basophils Absolute: 0.1 10*3/uL (ref 0.0–0.1)
Basophils Relative: 1 %
Eosinophils Absolute: 0.1 10*3/uL (ref 0.0–0.5)
Eosinophils Relative: 1 %
HCT: 46 % (ref 39.0–52.0)
Hemoglobin: 15.6 g/dL (ref 13.0–17.0)
Immature Granulocytes: 2 %
Lymphocytes Relative: 14 %
Lymphs Abs: 1.4 10*3/uL (ref 0.7–4.0)
MCH: 29.9 pg (ref 26.0–34.0)
MCHC: 33.9 g/dL (ref 30.0–36.0)
MCV: 88.1 fL (ref 80.0–100.0)
Monocytes Absolute: 0.9 10*3/uL (ref 0.1–1.0)
Monocytes Relative: 9 %
Neutro Abs: 7.5 10*3/uL (ref 1.7–7.7)
Neutrophils Relative %: 73 %
Platelets: 256 10*3/uL (ref 150–400)
RBC: 5.22 MIL/uL (ref 4.22–5.81)
RDW: 12.9 % (ref 11.5–15.5)
WBC: 10 10*3/uL (ref 4.0–10.5)
nRBC: 0 % (ref 0.0–0.2)

## 2021-10-10 LAB — RAPID URINE DRUG SCREEN, HOSP PERFORMED
Amphetamines: NOT DETECTED
Barbiturates: NOT DETECTED
Benzodiazepines: NOT DETECTED
Cocaine: NOT DETECTED
Opiates: NOT DETECTED
Tetrahydrocannabinol: NOT DETECTED

## 2021-10-10 LAB — CBG MONITORING, ED: Glucose-Capillary: 89 mg/dL (ref 70–99)

## 2021-10-10 LAB — LACTIC ACID, PLASMA: Lactic Acid, Venous: 1.8 mmol/L (ref 0.5–1.9)

## 2021-10-10 NOTE — ED Notes (Signed)
PTAR called by unit secretary, pt is number 8 on the list.

## 2021-10-10 NOTE — ED Provider Notes (Signed)
[facility] EMERGENCY DEPARTMENT Provider Note   CSN: [id number] Arrival date & time: 10/10/21  1254     History  Chief Complaint  Patient presents with   Rafi Kenneth is a 86 y.o. male.  86 year old male with underlying history of dementia, CAD, retention presents to the ED via EMS for altered mental status from [facility].  Patient was at [facility] for approximately 1 hour prior to being sent here.  He did suffer from an unwitnessed fall, they report patient is at his baseline.  He is currently on no blood thinners.  According to records review patient was at [facility] since the end of August through today.  5 caveat due to mental status.  The history is provided by medical records.  Fall       Home Medications Prior to Admission medications   Medication Sig Start Date End Date Taking? Authorizing Provider  acetaminophen (TYLENOL) 650 MG suppository Place 650 mg rectally every 4 (four) hours as needed for mild pain (fever > 38.0).   Yes [provider]  aspirin EC 81 MG tablet Take 81 mg by mouth daily.   Yes [provider]  cefTRIAXone 1 g in dextrose 5 % 50 mL Inject 1 g into the vein See admin instructions. Rocephin 1g/72m IVPB (1050mhr) every 24 hours for 7 days do not co-infuse with lactated ringers, do not mix with calcium containing products   Yes [provider]  Dextrose-Sodium Chloride (DEXTROSE 5 % AND 0.45% NACL) infusion Inject 125 mL/hr into the vein continuous.   Yes [provider]  diphenhydrAMINE (BENADRYL) 50 MG/ML injection Inject 25 mg into the vein once.   Yes [provider]  donepezil (ARICEPT) 10 MG tablet Take 10 mg by mouth daily.   Yes [provider]  enoxaparin (LOVENOX) 40 MG/0.4ML injection Inject 40 mg into the skin daily.   Yes [provider]  haloperidol (HALDOL) 10 MG tablet Take 10 mg by mouth once.   Yes [provider]  hydrOXYzine (VISTARIL) 25 MG/ML injection Inject 25 mg into the muscle once.   Yes [provider]  lactated ringers Inject 1,000 mLs into the vein See admin instructions. Lactated ringers 100071mver 1 hour, once.   Yes [provider]  levothyroxine (SYNTHROID) 112 MCG tablet Take 112 mcg by mouth daily.   Yes [provider]  lisinopril (ZESTRIL) 40 MG tablet Take 40 mg by mouth daily.   Yes [provider]  LORazepam (ATIVAN) 1 MG tablet Take 1 mg by mouth once.   Yes [provider]  LORAZEPAM IJ Inject 0.5 mg as directed once.   Yes [provider]  melatonin 3 MG TABS tablet Take 3 mg by mouth at bedtime as needed (sleep).   Yes [provider]  memantine (NAMENDA) 10 MG tablet Take 10 mg by mouth daily.   Yes [provider]  OLANZAPINE PO Take 2.5 mg by mouth See admin instructions. Olanzapine 2.'5mg'$  ODT at bedtime   Yes [provider]  piperacillin-tazobactam (ZOSYN) IVPB Inject 3.375 g into the vein See admin instructions. Zosyn 3.375g in NS 0.9% 100m2mPB vialmate, 200ml55m once.   Yes [provider]  potassium chloride 20 MEQ/100ML IVPB Inject 20 mEq into the vein See admin instructions. Potassium chloride 20 mEq/100ml 54m premix, administer over 120 minutes, once.   Yes [provider]  pravastatin (PRAVACHOL) 80 MG tablet Take  80 mg by mouth at bedtime.   Yes [provider]  sertraline (ZOLOFT) 25 MG tablet Take 25 mg by mouth daily.   Yes [provider]  simvastatin (ZOCOR) 40 MG tablet Take 40 mg by mouth daily.   Yes [provider]  sodium chloride 0.9 % Inject 1,000 mLs into the vein See admin instructions. Sodium Chloride 0.9% bolus 104m, administer over 1 hour, once.   Yes [provider]  TRAZODONE HCL PO Take 12.5 mg by mouth 3 (three) times daily as needed (sleep, agitation, anxiety).   Yes [provider]  ziprasidone  (GEODON) 20 MG injection Inject 10 mg into the muscle once.   Yes [provider]  nitroGLYCERIN (NITROSTAT) 0.4 MG SL tablet Place 1 tablet (0.4 mg total) under the tongue every 5 (five) minutes x 3 doses as needed for chest pain (if no relief after 2nd dose, proceed to ED or call 911). Up to 3 doses. Patient not taking: Reported on 10/10/2021 04/24/21   MSatira Sark MD  polyethylene glycol (MIRALAX / GLYCOLAX) 17 g packet Take 17 g by mouth daily as needed for mild constipation. Patient not taking: Reported on 10/10/2021 07/19/21   AHosie Poisson MD  ramipril (ALTACE) 10 MG capsule Take 10 mg by mouth daily. Patient not taking: Reported on 10/10/2021    [provider]      Allergies    Patient has no known allergies.    Review of Systems   Review of Systems  Unable to perform ROS: Dementia    Physical Exam Updated Vital Signs BP (!) 143/73   Pulse 63   Temp 97.6 F (36.4 C) (Oral)   Resp 17   Ht 6' (1.829 m)   Wt 92 kg   SpO2 99%   BMI 27.51 kg/m  Physical Exam Vitals and nursing note reviewed.  Constitutional:      Appearance: Normal appearance.  HENT:     Head: Normocephalic.  Eyes:     Pupils: Pupils are equal, round, and reactive to light.  Neck:     Comments: Aspen C collar in place Cardiovascular:     Rate and Rhythm: Normal rate.  Pulmonary:     Effort: Pulmonary effort is normal.  Abdominal:     General: Abdomen is flat.     Tenderness: There is no abdominal tenderness.  Skin:    General: Skin is warm and dry.  Neurological:     Mental Status: He is alert. Mental status is at baseline.     ED Results / Procedures / Treatments   Labs (all labs ordered are listed, but only abnormal results are displayed) Labs Reviewed  CBC WITH DIFFERENTIAL/PLATELET - Abnormal; Notable for the following components:      Result Value   Abs Immature Granulocytes 0.16 (*)    All other components within normal limits  COMPREHENSIVE METABOLIC PANEL -  Abnormal; Notable for the following components:   CO2 20 (*)    Calcium 8.2 (*)    Total Protein 6.3 (*)    Albumin 2.7 (*)    AST 95 (*)    ALT 89 (*)    Total Bilirubin 2.1 (*)    GFR, Estimated 57 (*)    All other components within normal limits  URINALYSIS, ROUTINE W REFLEX MICROSCOPIC - Abnormal; Notable for the following components:   Ketones, ur 5 (*)    All other components within normal limits  LACTIC ACID, PLASMA  RAPID URINE  DRUG SCREEN, HOSP PERFORMED  LACTIC ACID, PLASMA  CBG MONITORING, ED    EKG None  Radiology DG Chest 1 View  Result Date: 10/10/2021 CLINICAL DATA:  Provided history: Unwitnessed fall. History of dementia. EXAM: CHEST  1 VIEW COMPARISON:  Prior chest radiographs 09/02/2021 and earlier. FINDINGS: Small portions of both lateral costophrenic angles are excluded from the field of view. Heart size within normal limits. Aortic atherosclerosis. Mild prominence of the interstitial lung markings throughout the right lung and within the left lung base, new from the prior examination of 07/17/2021. No evidence of pleural effusion or pneumothorax. No acute bony abnormality identified. Degenerative changes of the spine. IMPRESSION: Small portions of both lateral costophrenic angles are excluded from the field of view. Mild prominence of the interstitial lung markings throughout the right lung and within the left lung base, new from the prior examination of 07/17/2021. This may reflect asymmetric interstitial edema or atypical/viral pneumonia. Aortic Atherosclerosis (ICD10-I70.0). Electronically Signed   By: Kellie Simmering D.O.   On: 10/10/2021 16:31   DG Pelvis 1-2 Views  Result Date: 10/10/2021 CLINICAL DATA:  Fall EXAM: PELVIS - 1-2 VIEW COMPARISON:  None Available. FINDINGS: There is no evidence of pelvic fracture or diastasis. No pelvic bone lesions are seen. IMPRESSION: Negative. Electronically Signed   By: Franchot Gallo M.D.   On: 10/10/2021 16:21   CT HEAD WO  CONTRAST (5MM)  Result Date: 10/10/2021 CLINICAL DATA:  Fall.  Head trauma EXAM: CT HEAD WITHOUT CONTRAST CT CERVICAL SPINE WITHOUT CONTRAST TECHNIQUE: Multidetector CT imaging of the head and cervical spine was performed following the standard protocol without intravenous contrast. Multiplanar CT image reconstructions of the cervical spine were also generated. RADIATION DOSE REDUCTION: This exam was performed according to the departmental dose-optimization program which includes automated exposure control, adjustment of the mA and/or kV according to patient size and/or use of iterative reconstruction technique. COMPARISON:  CT cervical spine and head 07/17/2021 FINDINGS: CT HEAD FINDINGS Brain: Moderate atrophy. Ventricular enlargement, stable mild periventricular white matter hypodensity. Negative for acute infarct, hemorrhage, mass. Vascular: Negative for hyperdense vessel Skull: Negative Sinuses/Orbits: Mild mucosal edema paranasal sinuses. Bilateral cataract extraction Other: None CT CERVICAL SPINE FINDINGS Alignment: Normal Skull base and vertebrae: Negative for fracture. No acute skeletal abnormality Soft tissues and spinal canal: Negative for mass or soft tissue swelling in the neck. Disc levels: Cervical spondylosis. Multilevel disc degeneration and spurring. Mild spinal stenosis C3-4 due to spurring. Upper chest: Lung apices clear bilaterally Other: None IMPRESSION: 1. Moderate atrophy and mild chronic white matter changes. No acute intracranial abnormality 2. Cervical spondylosis.  Negative for fracture Electronically Signed   By: Franchot Gallo M.D.   On: 10/10/2021 14:09   CT Cervical Spine Wo Contrast  Result Date: 10/10/2021 CLINICAL DATA:  Fall.  Head trauma EXAM: CT HEAD WITHOUT CONTRAST CT CERVICAL SPINE WITHOUT CONTRAST TECHNIQUE: Multidetector CT imaging of the head and cervical spine was performed following the standard protocol without intravenous contrast. Multiplanar CT image  reconstructions of the cervical spine were also generated. RADIATION DOSE REDUCTION: This exam was performed according to the departmental dose-optimization program which includes automated exposure control, adjustment of the mA and/or kV according to patient size and/or use of iterative reconstruction technique. COMPARISON:  CT cervical spine and head 07/17/2021 FINDINGS: CT HEAD FINDINGS Brain: Moderate atrophy. Ventricular enlargement, stable mild periventricular white matter hypodensity. Negative for acute infarct, hemorrhage, mass. Vascular: Negative for hyperdense vessel Skull: Negative Sinuses/Orbits: Mild mucosal edema paranasal sinuses. Bilateral  cataract extraction Other: None CT CERVICAL SPINE FINDINGS Alignment: Normal Skull base and vertebrae: Negative for fracture. No acute skeletal abnormality Soft tissues and spinal canal: Negative for mass or soft tissue swelling in the neck. Disc levels: Cervical spondylosis. Multilevel disc degeneration and spurring. Mild spinal stenosis C3-4 due to spurring. Upper chest: Lung apices clear bilaterally Other: None IMPRESSION: 1. Moderate atrophy and mild chronic white matter changes. No acute intracranial abnormality 2. Cervical spondylosis.  Negative for fracture Electronically Signed   By: Franchot Gallo M.D.   On: 10/10/2021 14:09    Procedures Procedures    Medications Ordered in ED Medications - No data to display  ED Course/ Medical Decision Making/ A&P                           Medical Decision Making Amount and/or Complexity of Data Reviewed Labs: ordered. Radiology: ordered.   This patient presents to the ED for concern of unwitnessed fall, this involves a number of treatment options, and is a complaint that carries with it a high risk of complications and morbidity.  The differential diagnosis includes trauma versus syncope.   Co morbidities: Discussed in HPI   Brief History:  Patient currently arriving can rehabilitation within  the hour had an unwitnessed fall.  They do report him striking his head.  Currently on no blood thinners.  Had c-collar placed by them and transferred to the emergency department.  EMR reviewed including pt PMHx, past surgical history and past visits to ER.   See HPI for more details   Lab Tests:  I ordered and independently interpreted labs.  The pertinent results include:    I personally reviewed all laboratory work and imaging. Metabolic panel without any acute abnormality specifically kidney function within normal limits and no significant electrolyte abnormalities. CBC without leukocytosis or significant anemia.  Lifteez are slightly elevated with AST at 95, ALT at 89 however abdomen is soft on exam.  Patient does not have a gallbladder.   Imaging Studies:  CT Head and neck showed: 1. Moderate atrophy and mild chronic white matter changes. No acute  intracranial abnormality  2. Cervical spondylosis.  Negative for fracture   Chest x-ray with some interstitial congestion versus pneumonia.  Do not suspect infectious etiology with the normal vitals, no white blood cell count, no cough or complaints.  CT pelvis is within normal limits.   Cardiac Monitoring:  The patient was maintained on a cardiac monitor.  I personally viewed and interpreted the cardiac monitored which showed an underlying rhythm of: NSR EKG non-ischemic   Reevaluation:  After the interventions noted above I re-evaluated patient and found that they have :stayed the same   Social Determinants of Health:   Patient currently under placement at [facility] rehabilitation he had arrived there today.    Problem List / ED Course:  Patient here status post unwitnessed fall after arriving at [facility] rehabilitation.  Patient has been hospitalized at [facility] health due to psychotic, dementia and changes in behavior.  I called [name] his wife spoke to her about his recent hospitalization.  He did have an MRI which was  reassuring, wound was being treated for UTI.  Reports daughter has now been made the POA, she will be coming to the emergency department.  Patient has had 4 weeks of failure to thrive, not dressing and self, not eating, no bathing.  He used to walk without any assisted device until about 4  weeks ago. Images such as CT head, cervical spine did not show any acute changes on today's visit.  His labs are benign, slight elevation of LFTs but abdomen is soft with palpation on exam.  He is hemodynamically stable. Further evaluation including a UA without any signs of infection, no allergies to UDS.  X-ray that showed some concern for viral pneumonia versus interstitial edema.  He is not febrile, no white blood cell count, no cough while in the ED.  I discussed all this information with daughter at the bedside who is his new POA.  Patient is due to return back to [facility] center at this time.  Dispostion:  After consideration of the diagnostic results and the patients response to treatment, I feel that the patent would benefit from close monitoring with sitter tonight at [facility] rehabilitation.  Collier Salina has been consulted.    Portions of this note were generated with [employer]. Dictation errors may occur despite best attempts at proofreading.  Final Clinical Impression(s) / ED Diagnoses Final diagnoses:  Fall, initial encounter    Rx / DC Orders ED Discharge Orders     None         Janeece Fitting, PA-C 10/10/21 1658    Elnora Morrison, MD 10/11/21 1445

## 2021-10-10 NOTE — ED Notes (Signed)
Dinner tray ordered for pt, pt refuses to eat or drink, pt becomes very agitated when attempting to feed pt and states that it is going to make him sick and he does not want it.

## 2021-10-10 NOTE — Discharge Instructions (Signed)
Your laboratory results are within normal limits.  All your x-rays along with CT head and CT of your neck were within normal limits.  Continue follow-up with your primary care physician.

## 2021-10-10 NOTE — ED Triage Notes (Signed)
Unwitnessed fall in room. From [facility]. Hx of dementia. Mentation at baseline. Denies pain. Not on blood thinners.

## 2021-10-10 NOTE — ED Notes (Signed)
Daughter asked that someone call her when patient is picked up so that she can make sure that someone is at the house to look after him since he can't be home alone  [name] [phone]

## 2021-10-11 DIAGNOSIS — Z955 Presence of coronary angioplasty implant and graft: Secondary | ICD-10-CM | POA: Diagnosis not present

## 2021-10-11 DIAGNOSIS — E039 Hypothyroidism, unspecified: Secondary | ICD-10-CM | POA: Diagnosis not present

## 2021-10-11 DIAGNOSIS — Z87448 Personal history of other diseases of urinary system: Secondary | ICD-10-CM | POA: Diagnosis not present

## 2021-10-11 DIAGNOSIS — G9341 Metabolic encephalopathy: Secondary | ICD-10-CM | POA: Diagnosis not present

## 2021-10-11 DIAGNOSIS — I2510 Atherosclerotic heart disease of native coronary artery without angina pectoris: Secondary | ICD-10-CM | POA: Diagnosis not present

## 2021-10-11 DIAGNOSIS — F03911 Unspecified dementia, unspecified severity, with agitation: Secondary | ICD-10-CM | POA: Diagnosis not present

## 2021-10-11 DIAGNOSIS — I10 Essential (primary) hypertension: Secondary | ICD-10-CM | POA: Diagnosis not present

## 2021-10-11 DIAGNOSIS — I714 Abdominal aortic aneurysm, without rupture, unspecified: Secondary | ICD-10-CM | POA: Diagnosis not present

## 2021-10-11 DIAGNOSIS — F0392 Unspecified dementia, unspecified severity, with psychotic disturbance: Secondary | ICD-10-CM | POA: Diagnosis not present

## 2021-10-12 DIAGNOSIS — M47812 Spondylosis without myelopathy or radiculopathy, cervical region: Secondary | ICD-10-CM | POA: Diagnosis not present

## 2021-10-12 DIAGNOSIS — F03C0 Unspecified dementia, severe, without behavioral disturbance, psychotic disturbance, mood disturbance, and anxiety: Secondary | ICD-10-CM | POA: Diagnosis not present

## 2021-10-12 DIAGNOSIS — I700 Atherosclerosis of aorta: Secondary | ICD-10-CM | POA: Diagnosis not present

## 2021-10-12 DIAGNOSIS — R296 Repeated falls: Secondary | ICD-10-CM | POA: Diagnosis not present

## 2021-10-12 DIAGNOSIS — R7401 Elevation of levels of liver transaminase levels: Secondary | ICD-10-CM | POA: Diagnosis not present

## 2021-10-12 DIAGNOSIS — Z9189 Other specified personal risk factors, not elsewhere classified: Secondary | ICD-10-CM | POA: Diagnosis not present

## 2021-10-12 DIAGNOSIS — I672 Cerebral atherosclerosis: Secondary | ICD-10-CM | POA: Diagnosis not present

## 2021-10-12 DIAGNOSIS — G9341 Metabolic encephalopathy: Secondary | ICD-10-CM | POA: Diagnosis not present

## 2021-10-16 DIAGNOSIS — Z9181 History of falling: Secondary | ICD-10-CM | POA: Diagnosis not present

## 2021-10-16 DIAGNOSIS — E43 Unspecified severe protein-calorie malnutrition: Secondary | ICD-10-CM | POA: Diagnosis not present

## 2021-10-16 DIAGNOSIS — R2681 Unsteadiness on feet: Secondary | ICD-10-CM | POA: Diagnosis not present

## 2021-10-16 DIAGNOSIS — F0390 Unspecified dementia without behavioral disturbance: Secondary | ICD-10-CM | POA: Diagnosis not present

## 2021-10-16 DIAGNOSIS — R5381 Other malaise: Secondary | ICD-10-CM | POA: Diagnosis not present

## 2021-10-16 DIAGNOSIS — G9341 Metabolic encephalopathy: Secondary | ICD-10-CM | POA: Diagnosis not present

## 2021-10-16 DIAGNOSIS — N390 Urinary tract infection, site not specified: Secondary | ICD-10-CM | POA: Diagnosis not present

## 2021-10-17 DIAGNOSIS — E039 Hypothyroidism, unspecified: Secondary | ICD-10-CM | POA: Diagnosis not present

## 2021-10-17 DIAGNOSIS — Z515 Encounter for palliative care: Secondary | ICD-10-CM | POA: Diagnosis not present

## 2021-10-17 DIAGNOSIS — F0393 Unspecified dementia, unspecified severity, with mood disturbance: Secondary | ICD-10-CM | POA: Diagnosis not present

## 2021-10-17 DIAGNOSIS — F0392 Unspecified dementia, unspecified severity, with psychotic disturbance: Secondary | ICD-10-CM | POA: Diagnosis not present

## 2021-10-17 DIAGNOSIS — I10 Essential (primary) hypertension: Secondary | ICD-10-CM | POA: Diagnosis not present

## 2021-10-17 DIAGNOSIS — M47812 Spondylosis without myelopathy or radiculopathy, cervical region: Secondary | ICD-10-CM | POA: Diagnosis not present

## 2021-10-17 DIAGNOSIS — F32A Depression, unspecified: Secondary | ICD-10-CM | POA: Diagnosis not present

## 2021-10-17 DIAGNOSIS — Z029 Encounter for administrative examinations, unspecified: Secondary | ICD-10-CM | POA: Diagnosis not present

## 2021-10-17 DIAGNOSIS — S0990XD Unspecified injury of head, subsequent encounter: Secondary | ICD-10-CM | POA: Diagnosis not present

## 2021-10-18 DIAGNOSIS — R2681 Unsteadiness on feet: Secondary | ICD-10-CM | POA: Diagnosis not present

## 2021-10-18 DIAGNOSIS — R7989 Other specified abnormal findings of blood chemistry: Secondary | ICD-10-CM | POA: Diagnosis not present

## 2021-10-18 DIAGNOSIS — R590 Localized enlarged lymph nodes: Secondary | ICD-10-CM | POA: Diagnosis not present

## 2021-10-18 DIAGNOSIS — B370 Candidal stomatitis: Secondary | ICD-10-CM | POA: Diagnosis not present

## 2021-10-18 DIAGNOSIS — E43 Unspecified severe protein-calorie malnutrition: Secondary | ICD-10-CM | POA: Diagnosis not present

## 2021-10-18 DIAGNOSIS — Z9181 History of falling: Secondary | ICD-10-CM | POA: Diagnosis not present

## 2021-10-18 DIAGNOSIS — R5381 Other malaise: Secondary | ICD-10-CM | POA: Diagnosis not present

## 2021-10-18 DIAGNOSIS — G9341 Metabolic encephalopathy: Secondary | ICD-10-CM | POA: Diagnosis not present

## 2021-10-18 DIAGNOSIS — F0390 Unspecified dementia without behavioral disturbance: Secondary | ICD-10-CM | POA: Diagnosis not present

## 2021-10-18 DIAGNOSIS — R638 Other symptoms and signs concerning food and fluid intake: Secondary | ICD-10-CM | POA: Diagnosis not present

## 2021-10-18 DIAGNOSIS — N390 Urinary tract infection, site not specified: Secondary | ICD-10-CM | POA: Diagnosis not present

## 2021-10-19 DIAGNOSIS — R1310 Dysphagia, unspecified: Secondary | ICD-10-CM | POA: Diagnosis not present

## 2021-10-20 ENCOUNTER — Emergency Department (HOSPITAL_COMMUNITY): Payer: Medicare PPO

## 2021-10-20 ENCOUNTER — Inpatient Hospital Stay (HOSPITAL_COMMUNITY): Payer: Medicare PPO

## 2021-10-20 ENCOUNTER — Inpatient Hospital Stay (HOSPITAL_COMMUNITY)
Admission: EM | Admit: 2021-10-20 | Discharge: 2021-10-29 | DRG: 177 | Disposition: A | Discharge status: Hospice | Payer: Medicare PPO | Attending: Internal Medicine | Admitting: Internal Medicine

## 2021-10-20 DIAGNOSIS — R7989 Other specified abnormal findings of blood chemistry: Secondary | ICD-10-CM | POA: Diagnosis present

## 2021-10-20 DIAGNOSIS — B3789 Other sites of candidiasis: Secondary | ICD-10-CM | POA: Diagnosis not present

## 2021-10-20 DIAGNOSIS — N1330 Unspecified hydronephrosis: Secondary | ICD-10-CM | POA: Diagnosis not present

## 2021-10-20 DIAGNOSIS — R1310 Dysphagia, unspecified: Secondary | ICD-10-CM

## 2021-10-20 DIAGNOSIS — U071 COVID-19: Principal | ICD-10-CM

## 2021-10-20 DIAGNOSIS — Z7982 Long term (current) use of aspirin: Secondary | ICD-10-CM

## 2021-10-20 DIAGNOSIS — N200 Calculus of kidney: Secondary | ICD-10-CM | POA: Diagnosis not present

## 2021-10-20 DIAGNOSIS — Y929 Unspecified place or not applicable: Secondary | ICD-10-CM | POA: Diagnosis not present

## 2021-10-20 DIAGNOSIS — E870 Hyperosmolality and hypernatremia: Secondary | ICD-10-CM | POA: Diagnosis not present

## 2021-10-20 DIAGNOSIS — N1832 Chronic kidney disease, stage 3b: Secondary | ICD-10-CM | POA: Diagnosis present

## 2021-10-20 DIAGNOSIS — E860 Dehydration: Secondary | ICD-10-CM | POA: Diagnosis present

## 2021-10-20 DIAGNOSIS — R4182 Altered mental status, unspecified: Secondary | ICD-10-CM | POA: Diagnosis not present

## 2021-10-20 DIAGNOSIS — F0394 Unspecified dementia, unspecified severity, with anxiety: Secondary | ICD-10-CM | POA: Diagnosis present

## 2021-10-20 DIAGNOSIS — E039 Hypothyroidism, unspecified: Secondary | ICD-10-CM | POA: Diagnosis present

## 2021-10-20 DIAGNOSIS — L8990 Pressure ulcer of unspecified site, unspecified stage: Secondary | ICD-10-CM | POA: Insufficient documentation

## 2021-10-20 DIAGNOSIS — A419 Sepsis, unspecified organism: Secondary | ICD-10-CM | POA: Diagnosis not present

## 2021-10-20 DIAGNOSIS — Z8249 Family history of ischemic heart disease and other diseases of the circulatory system: Secondary | ICD-10-CM | POA: Diagnosis not present

## 2021-10-20 DIAGNOSIS — Z781 Physical restraint status: Secondary | ICD-10-CM

## 2021-10-20 DIAGNOSIS — I2510 Atherosclerotic heart disease of native coronary artery without angina pectoris: Secondary | ICD-10-CM | POA: Diagnosis present

## 2021-10-20 DIAGNOSIS — B370 Candidal stomatitis: Secondary | ICD-10-CM

## 2021-10-20 DIAGNOSIS — E782 Mixed hyperlipidemia: Secondary | ICD-10-CM | POA: Diagnosis present

## 2021-10-20 DIAGNOSIS — F0390 Unspecified dementia without behavioral disturbance: Secondary | ICD-10-CM | POA: Diagnosis not present

## 2021-10-20 DIAGNOSIS — G9341 Metabolic encephalopathy: Secondary | ICD-10-CM | POA: Diagnosis not present

## 2021-10-20 DIAGNOSIS — W19XXXA Unspecified fall, initial encounter: Secondary | ICD-10-CM | POA: Diagnosis not present

## 2021-10-20 DIAGNOSIS — Z79890 Hormone replacement therapy (postmenopausal): Secondary | ICD-10-CM

## 2021-10-20 DIAGNOSIS — G319 Degenerative disease of nervous system, unspecified: Secondary | ICD-10-CM | POA: Diagnosis present

## 2021-10-20 DIAGNOSIS — N179 Acute kidney failure, unspecified: Secondary | ICD-10-CM

## 2021-10-20 DIAGNOSIS — Z79899 Other long term (current) drug therapy: Secondary | ICD-10-CM

## 2021-10-20 DIAGNOSIS — F32A Depression, unspecified: Secondary | ICD-10-CM | POA: Diagnosis present

## 2021-10-20 DIAGNOSIS — R609 Edema, unspecified: Secondary | ICD-10-CM | POA: Diagnosis not present

## 2021-10-20 DIAGNOSIS — J0430 Supraglottitis, unspecified, without obstruction: Secondary | ICD-10-CM | POA: Diagnosis present

## 2021-10-20 DIAGNOSIS — Z7189 Other specified counseling: Secondary | ICD-10-CM

## 2021-10-20 DIAGNOSIS — I252 Old myocardial infarction: Secondary | ICD-10-CM

## 2021-10-20 DIAGNOSIS — M19011 Primary osteoarthritis, right shoulder: Secondary | ICD-10-CM | POA: Diagnosis not present

## 2021-10-20 DIAGNOSIS — Z66 Do not resuscitate: Secondary | ICD-10-CM | POA: Diagnosis not present

## 2021-10-20 DIAGNOSIS — Z515 Encounter for palliative care: Secondary | ICD-10-CM

## 2021-10-20 DIAGNOSIS — R531 Weakness: Secondary | ICD-10-CM

## 2021-10-20 DIAGNOSIS — F419 Anxiety disorder, unspecified: Secondary | ICD-10-CM | POA: Diagnosis present

## 2021-10-20 DIAGNOSIS — Z8349 Family history of other endocrine, nutritional and metabolic diseases: Secondary | ICD-10-CM

## 2021-10-20 DIAGNOSIS — E876 Hypokalemia: Secondary | ICD-10-CM | POA: Diagnosis present

## 2021-10-20 DIAGNOSIS — J029 Acute pharyngitis, unspecified: Secondary | ICD-10-CM | POA: Diagnosis not present

## 2021-10-20 DIAGNOSIS — I10 Essential (primary) hypertension: Secondary | ICD-10-CM | POA: Diagnosis present

## 2021-10-20 DIAGNOSIS — M19012 Primary osteoarthritis, left shoulder: Secondary | ICD-10-CM | POA: Diagnosis not present

## 2021-10-20 DIAGNOSIS — R5383 Other fatigue: Secondary | ICD-10-CM | POA: Diagnosis not present

## 2021-10-20 DIAGNOSIS — M47814 Spondylosis without myelopathy or radiculopathy, thoracic region: Secondary | ICD-10-CM | POA: Diagnosis not present

## 2021-10-20 DIAGNOSIS — I129 Hypertensive chronic kidney disease with stage 1 through stage 4 chronic kidney disease, or unspecified chronic kidney disease: Secondary | ICD-10-CM | POA: Diagnosis present

## 2021-10-20 LAB — MAGNESIUM: Magnesium: 2.1 mg/dL (ref 1.7–2.4)

## 2021-10-20 LAB — CBC WITH DIFFERENTIAL/PLATELET
Abs Immature Granulocytes: 0.93 10*3/uL — ABNORMAL HIGH (ref 0.00–0.07)
Basophils Absolute: 0.3 10*3/uL — ABNORMAL HIGH (ref 0.0–0.1)
Basophils Relative: 1 %
Eosinophils Absolute: 0.3 10*3/uL (ref 0.0–0.5)
Eosinophils Relative: 1 %
HCT: 51.1 % (ref 39.0–52.0)
Hemoglobin: 16.5 g/dL (ref 13.0–17.0)
Immature Granulocytes: 4 %
Lymphocytes Relative: 10 %
Lymphs Abs: 2.1 10*3/uL (ref 0.7–4.0)
MCH: 29.5 pg (ref 26.0–34.0)
MCHC: 32.3 g/dL (ref 30.0–36.0)
MCV: 91.3 fL (ref 80.0–100.0)
Monocytes Absolute: 1.7 10*3/uL — ABNORMAL HIGH (ref 0.1–1.0)
Monocytes Relative: 8 %
Neutro Abs: 15.8 10*3/uL — ABNORMAL HIGH (ref 1.7–7.7)
Neutrophils Relative %: 76 %
Platelets: 316 10*3/uL (ref 150–400)
RBC: 5.6 MIL/uL (ref 4.22–5.81)
RDW: 13.8 % (ref 11.5–15.5)
WBC: 21.1 10*3/uL — ABNORMAL HIGH (ref 4.0–10.5)
nRBC: 0 % (ref 0.0–0.2)

## 2021-10-20 LAB — AMMONIA: Ammonia: 14 umol/L (ref 9–35)

## 2021-10-20 LAB — COMPREHENSIVE METABOLIC PANEL
ALT: 34 U/L (ref 0–44)
AST: 30 U/L (ref 15–41)
Albumin: 3 g/dL — ABNORMAL LOW (ref 3.5–5.0)
Alkaline Phosphatase: 42 U/L (ref 38–126)
Anion gap: 11 (ref 5–15)
BUN: 50 mg/dL — ABNORMAL HIGH (ref 8–23)
CO2: 21 mmol/L — ABNORMAL LOW (ref 22–32)
Calcium: 8.8 mg/dL — ABNORMAL LOW (ref 8.9–10.3)
Chloride: 118 mmol/L — ABNORMAL HIGH (ref 98–111)
Creatinine, Ser: 1.82 mg/dL — ABNORMAL HIGH (ref 0.61–1.24)
GFR, Estimated: 36 mL/min — ABNORMAL LOW (ref >60)
Glucose, Bld: 116 mg/dL — ABNORMAL HIGH (ref 70–99)
Potassium: 3.8 mmol/L (ref 3.5–5.1)
Sodium: 150 mmol/L — ABNORMAL HIGH (ref 135–145)
Total Bilirubin: 0.8 mg/dL (ref 0.3–1.2)
Total Protein: 6.7 g/dL (ref 6.5–8.1)

## 2021-10-20 LAB — URINALYSIS, ROUTINE W REFLEX MICROSCOPIC
Bilirubin Urine: NEGATIVE
Glucose, UA: NEGATIVE mg/dL
Ketones, ur: NEGATIVE mg/dL
Leukocytes,Ua: NEGATIVE
Nitrite: NEGATIVE
Protein, ur: NEGATIVE mg/dL
Specific Gravity, Urine: 1.024 (ref 1.005–1.030)
pH: 5 (ref 5.0–8.0)

## 2021-10-20 LAB — RESP PANEL BY RT-PCR (FLU A&B, COVID) ARPGX2
Influenza A by PCR: NEGATIVE
Influenza B by PCR: NEGATIVE
SARS Coronavirus 2 by RT PCR: POSITIVE — AB

## 2021-10-20 LAB — TROPONIN I (HIGH SENSITIVITY)
Troponin I (High Sensitivity): 74 ng/L — ABNORMAL HIGH (ref <18)
Troponin I (High Sensitivity): 63 ng/L — ABNORMAL HIGH (ref <18)

## 2021-10-20 LAB — I-STAT CHEM 8, ED
BUN: 49 mg/dL — ABNORMAL HIGH (ref 8–23)
Calcium, Ion: 1.04 mmol/L — ABNORMAL LOW (ref 1.15–1.40)
Chloride: 117 mmol/L — ABNORMAL HIGH (ref 98–111)
Creatinine, Ser: 1.9 mg/dL — ABNORMAL HIGH (ref 0.61–1.24)
Glucose, Bld: 124 mg/dL — ABNORMAL HIGH (ref 70–99)
HCT: 47 % (ref 39.0–52.0)
Hemoglobin: 16 g/dL (ref 13.0–17.0)
Potassium: 3.7 mmol/L (ref 3.5–5.1)
Sodium: 151 mmol/L — ABNORMAL HIGH (ref 135–145)
TCO2: 21 mmol/L — ABNORMAL LOW (ref 22–32)

## 2021-10-20 LAB — BLOOD GAS, VENOUS
Acid-Base Excess: 0.5 mmol/L (ref 0.0–2.0)
Bicarbonate: 26 mmol/L (ref 20.0–28.0)
O2 Saturation: 68.5 %
Patient temperature: 37
pCO2, Ven: 44 mmHg (ref 44–60)
pH, Ven: 7.38 (ref 7.25–7.43)
pO2, Ven: 44 mmHg (ref 32–45)

## 2021-10-20 LAB — TSH: TSH: 35.047 u[IU]/mL — ABNORMAL HIGH (ref 0.350–4.500)

## 2021-10-20 LAB — LACTIC ACID, PLASMA: Lactic Acid, Venous: 2.6 mmol/L (ref 0.5–1.9)

## 2021-10-20 MED ORDER — SODIUM CHLORIDE 0.9 % IV SOLN: 100.0000 mg | Freq: Every day | INTRAVENOUS | Status: DC

## 2021-10-20 MED ORDER — ENOXAPARIN SODIUM 30 MG/0.3ML IJ SOSY
30.0000 mg | PREFILLED_SYRINGE | INTRAMUSCULAR | Status: DC
  Administered 2021-10-20 – 2021-10-21 (×2): 30 mg via SUBCUTANEOUS
  Filled 2021-10-20 (×2): qty 0.3

## 2021-10-20 MED ORDER — FLUCONAZOLE 100MG IVPB
50.0000 mg | INTRAVENOUS | Status: DC
  Administered 2021-10-21 – 2021-10-24 (×4): 50 mg via INTRAVENOUS
  Filled 2021-10-20 (×6): qty 25

## 2021-10-20 MED ORDER — ACETAMINOPHEN 325 MG PO TABS
650.0000 mg | ORAL_TABLET | Freq: Four times a day (QID) | ORAL | Status: DC | PRN
  Filled 2021-10-20: qty 2

## 2021-10-20 MED ORDER — SODIUM CHLORIDE 0.9 % IV SOLN
3.0000 g | Freq: Once | INTRAVENOUS | Status: AC
  Administered 2021-10-20: 3 g via INTRAVENOUS
  Filled 2021-10-20: qty 8

## 2021-10-20 MED ORDER — LACTATED RINGERS IV BOLUS (SEPSIS)
1000.0000 mL | Freq: Once | INTRAVENOUS | Status: AC
  Administered 2021-10-20: 1000 mL via INTRAVENOUS

## 2021-10-20 MED ORDER — SODIUM CHLORIDE 0.9 % IV SOLN
3.0000 g | Freq: Four times a day (QID) | INTRAVENOUS | Status: DC
  Administered 2021-10-20 – 2021-10-25 (×20): 3 g via INTRAVENOUS
  Filled 2021-10-20 (×22): qty 8

## 2021-10-20 MED ORDER — NYSTATIN 100000 UNIT/ML MT SUSP
5.0000 mL | Freq: Four times a day (QID) | OROMUCOSAL | Status: DC
  Filled 2021-10-20 (×3): qty 5

## 2021-10-20 MED ORDER — SODIUM CHLORIDE 0.9 % IV SOLN
100.0000 mg | Freq: Once | INTRAVENOUS | Status: AC
  Administered 2021-10-20: 100 mg via INTRAVENOUS
  Filled 2021-10-20: qty 20

## 2021-10-20 MED ORDER — METHYLPREDNISOLONE SODIUM SUCC 125 MG IJ SOLR
60.0000 mg | Freq: Once | INTRAMUSCULAR | Status: AC
  Administered 2021-10-20: 60 mg via INTRAVENOUS
  Filled 2021-10-20: qty 2

## 2021-10-20 MED ORDER — LACTATED RINGERS IV BOLUS
1000.0000 mL | Freq: Once | INTRAVENOUS | Status: AC
  Administered 2021-10-20: 1000 mL via INTRAVENOUS

## 2021-10-20 MED ORDER — FLUCONAZOLE IN SODIUM CHLORIDE 200-0.9 MG/100ML-% IV SOLN
200.0000 mg | Freq: Once | INTRAVENOUS | Status: AC
  Administered 2021-10-20: 200 mg via INTRAVENOUS
  Filled 2021-10-20: qty 100

## 2021-10-20 MED ORDER — SODIUM CHLORIDE 0.9 % IV SOLN: 200.0000 mg | Freq: Once | INTRAVENOUS | Status: DC

## 2021-10-20 MED ORDER — METHYLPREDNISOLONE SODIUM SUCC 40 MG IJ SOLR
40.0000 mg | Freq: Every day | INTRAMUSCULAR | Status: DC
  Administered 2021-10-21: 40 mg via INTRAVENOUS
  Filled 2021-10-20: qty 1

## 2021-10-20 MED ORDER — SODIUM CHLORIDE 0.9 % IV SOLN
100.0000 mg | Freq: Every day | INTRAVENOUS | Status: AC
  Administered 2021-10-21 – 2021-10-22 (×2): 100 mg via INTRAVENOUS
  Filled 2021-10-20 (×2): qty 20

## 2021-10-20 MED ORDER — LEVOTHYROXINE SODIUM 100 MCG/5ML IV SOLN
100.0000 ug | Freq: Every day | INTRAVENOUS | Status: DC
  Administered 2021-10-20 – 2021-10-25 (×6): 100 ug via INTRAVENOUS
  Filled 2021-10-20 (×6): qty 5

## 2021-10-20 MED ORDER — ACETAMINOPHEN 650 MG RE SUPP: 650.0000 mg | Freq: Four times a day (QID) | RECTAL | Status: DC | PRN

## 2021-10-20 MED ORDER — IOHEXOL 300 MG/ML  SOLN
75.0000 mL | Freq: Once | INTRAMUSCULAR | Status: AC | PRN
  Administered 2021-10-20: 75 mL via INTRAVENOUS

## 2021-10-20 MED ORDER — LIOTHYRONINE SODIUM 10 MCG/ML IV SOLN
2.5000 ug | Freq: Three times a day (TID) | INTRAVENOUS | Status: DC
  Administered 2021-10-21 – 2021-10-22 (×4): 2.5 ug via INTRAVENOUS
  Filled 2021-10-20 (×9): qty 0.25

## 2021-10-20 MED ORDER — LEVOTHYROXINE SODIUM 100 MCG/5ML IV SOLN
100.0000 ug | Freq: Once | INTRAVENOUS | Status: AC
  Administered 2021-10-20: 100 ug via INTRAVENOUS
  Filled 2021-10-20: qty 5

## 2021-10-20 MED ORDER — LIOTHYRONINE SODIUM 10 MCG/ML IV SOLN
5.0000 ug | Freq: Once | INTRAVENOUS | Status: AC
  Administered 2021-10-20: 5 ug via INTRAVENOUS
  Filled 2021-10-20: qty 0.5

## 2021-10-20 MED ORDER — ONDANSETRON HCL 4 MG/2ML IJ SOLN: 4.0000 mg | Freq: Four times a day (QID) | INTRAMUSCULAR | Status: DC | PRN

## 2021-10-20 MED ORDER — IPRATROPIUM-ALBUTEROL 20-100 MCG/ACT IN AERS: 1.0000 | INHALATION_SPRAY | Freq: Four times a day (QID) | RESPIRATORY_TRACT | Status: DC | PRN

## 2021-10-20 MED ORDER — LACTATED RINGERS IV SOLN: INTRAVENOUS | Status: DC

## 2021-10-20 MED ORDER — ONDANSETRON HCL 4 MG PO TABS: 4.0000 mg | ORAL_TABLET | Freq: Four times a day (QID) | ORAL | Status: DC | PRN

## 2021-10-20 MED ORDER — SODIUM CHLORIDE 0.9 % IV SOLN
100.0000 mg | Freq: Once | INTRAVENOUS | Status: AC
  Administered 2021-10-20 (×2): 100 mg via INTRAVENOUS
  Filled 2021-10-20: qty 20

## 2021-10-20 NOTE — ED Triage Notes (Signed)
Pt BIBA from [facility] for lethargy x4 days and difficulty swallowing x2 days. Junky cough, no stridor, no distress. Staff reports no PO intake x2 days. Minimally verbal at baseline. Alert. US neck yesterday negative. Hx late stage dementia.   152/78 HR 80s  97% RA CBG 190 afebrile

## 2021-10-20 NOTE — ED Notes (Signed)
Pt moved arms out from [facility]- replaced and repositioned at this time

## 2021-10-20 NOTE — H&P (Signed)
History and Physical    Patient: Cole Mayer XKG:[id number] DOB: 1935-10-29 DOA: 10/20/2021 DOS: the patient was seen and examined on 10/20/2021 PCP: Patient, No Pcp Per  Patient coming from: Home  Chief Complaint:  Chief Complaint  Patient presents with   Sore Throat   Fatigue   HPI: Cole Mayer is a 86 y.o. male with medical history significant of dementia, HTN, CAD, hypothyroidism, HLD. Presenting with odynophagia. History is from his daughter. He was recently discharged 1 week ago from a hospital stay to rehab to prep him for transition to memory care. While at rehab, he was not eating. His daughter noticed that he was wincing when he was swallowing as if he were in pain. He didn't have any fevers, N/V. When his symptoms didn't improve this morning, she requested that the facility send him to the ED for evaluation.   Review of Systems: As mentioned in the history of present illness. All other systems reviewed and are negative. Past Medical History:  Diagnosis Date   Coronary atherosclerosis of native coronary artery    DES LAD and DES x3 RCA 2005, LVEF 60%    Dementia (HCC)    Essential hypertension    Hypothyroidism    Mixed hyperlipidemia    Myocardial infarction ([place])    1989 (Streptokinase) and 2005   Past Surgical History:  Procedure Laterality Date   CHOLECYSTECTOMY     COLONOSCOPY N/A 01/18/2015   Procedure: COLONOSCOPY;  Surgeon: Rogene Houston, MD;  Location: [facility];  Service: Endoscopy;  Laterality: N/A;  730   CORONARY ANGIOPLASTY     Stents   TONSILLECTOMY     Social History:  reports that he quit smoking about 33 years ago. His smoking use included cigarettes. He started smoking about 64 years ago. He has a 24.00 pack-year smoking history. He has quit using smokeless tobacco.  His smokeless tobacco use included chew. He reports current alcohol use. He reports that he does not use drugs.  No Known Allergies  Family History  Problem Relation Age  of Onset   Coronary artery disease Other    Thyroid disease Father    Hypertension Father     Prior to Admission medications   Medication Sig Start Date End Date Taking? Authorizing Provider  amLODipine (NORVASC) 2.5 MG tablet Take 2.5 mg by mouth See admin instructions. "Take 2.5 mg by mouth once a day with 4-8 ounces of water"   Yes [provider]  aspirin EC 81 MG tablet Take 81 mg by mouth daily.   Yes [provider]  donepezil (ARICEPT) 10 MG tablet Take 10 mg by mouth daily.   Yes [provider]  levothyroxine (SYNTHROID) 112 MCG tablet Take 112 mcg by mouth daily before breakfast.   Yes [provider]  melatonin 3 MG TABS tablet Take 3 mg by mouth at bedtime as needed (sleep).   Yes [provider]  memantine (NAMENDA) 10 MG tablet Take 10 mg by mouth daily.   Yes [provider]  QUEtiapine (SEROQUEL) 25 MG tablet Take 25 mg by mouth at bedtime as needed ("for paranoia, agitation, or insomnia"). 10/15/21 10/29/21 Yes [provider]  sertraline (ZOLOFT) 25 MG tablet Take 25 mg by mouth daily.   Yes [provider]  simvastatin (ZOCOR) 40 MG tablet Take 40 mg by mouth at bedtime.   Yes [provider]  nitroGLYCERIN (NITROSTAT) 0.4 MG SL tablet Place 1 tablet (0.4 mg total) under the  tongue every 5 (five) minutes x 3 doses as needed for chest pain (if no relief after 2nd dose, proceed to ED or call 911). Up to 3 doses. Patient not taking: Reported on 10/10/2021 04/24/21   Satira Sark, MD  polyethylene glycol (MIRALAX / GLYCOLAX) 17 g packet Take 17 g by mouth daily as needed for mild constipation. Patient not taking: Reported on 10/10/2021 07/19/21   Hosie Poisson, MD    Physical Exam: Vitals:   10/20/21 1115 10/20/21 1230 10/20/21 1330 10/20/21 1345  BP: (!) 152/78 131/75 133/77   Pulse: 86 80 80   Resp: '13 14 17   '$ Temp:    97.7 F (36.5 C)  TempSrc:    Axillary  SpO2: 100% 99% 100%     General: 86 y.o. male resting in bed in NAD Eyes: PERRL, normal sclera ENMT: Nares patent w/o discharge, orophaynx coated/dry, dentition poor, ears w/o discharge/lesions/ulcers Neck: Supple, trachea midline Cardiovascular: RRR, +S1, S2, no m/g/r, equal pulses throughout Respiratory: CTABL, no w/r/r, normal WOB GI: BS+, NDNT, no masses noted, no organomegaly noted MSK: No e/c/c Neuro: somnolent, stirs to noxious stimuli   Data Reviewed:  Results for orders placed or performed during the hospital encounter of 10/20/21 (from the past 24 hour(s))  Comprehensive metabolic panel     Status: Abnormal   Collection Time: 10/20/21 10:20 AM  Result Value Ref Range   Sodium 150 (H) 135 - 145 mmol/L   Potassium 3.8 3.5 - 5.1 mmol/L   Chloride 118 (H) 98 - 111 mmol/L   CO2 21 (L) 22 - 32 mmol/L   Glucose, Bld 116 (H) 70 - 99 mg/dL   BUN 50 (H) 8 - 23 mg/dL   Creatinine, Ser 1.82 (H) 0.61 - 1.24 mg/dL   Calcium 8.8 (L) 8.9 - 10.3 mg/dL   Total Protein 6.7 6.5 - 8.1 g/dL   Albumin 3.0 (L) 3.5 - 5.0 g/dL   AST 30 15 - 41 U/L   ALT 34 0 - 44 U/L   Alkaline Phosphatase 42 38 - 126 U/L   Total Bilirubin 0.8 0.3 - 1.2 mg/dL   GFR, Estimated 36 (L) >60 mL/min   Anion gap 11 5 - 15  CBC WITH DIFFERENTIAL     Status: Abnormal   Collection Time: 10/20/21 10:20 AM  Result Value Ref Range   WBC 21.1 (H) 4.0 - 10.5 K/uL   RBC 5.60 4.22 - 5.81 MIL/uL   Hemoglobin 16.5 13.0 - 17.0 g/dL   HCT 51.1 39.0 - 52.0 %   MCV 91.3 80.0 - 100.0 fL   MCH 29.5 26.0 - 34.0 pg   MCHC 32.3 30.0 - 36.0 g/dL   RDW 13.8 11.5 - 15.5 %   Platelets 316 150 - 400 K/uL   nRBC 0.0 0.0 - 0.2 %   Neutrophils Relative % 76 %   Neutro Abs 15.8 (H) 1.7 - 7.7 K/uL   Lymphocytes Relative 10 %   Lymphs Abs 2.1 0.7 - 4.0 K/uL   Monocytes Relative 8 %   Monocytes Absolute 1.7 (H) 0.1 - 1.0 K/uL   Eosinophils Relative 1 %   Eosinophils Absolute 0.3 0.0 - 0.5 K/uL   Basophils Relative 1 %   Basophils Absolute 0.3 (H) 0.0 -  0.1 K/uL   Immature Granulocytes 4 %   Abs Immature Granulocytes 0.93 (H) 0.00 - 0.07 K/uL  Magnesium     Status: None   Collection Time: 10/20/21 10:20 AM  Result Value Ref Range  Magnesium 2.1 1.7 - 2.4 mg/dL  Troponin I (High Sensitivity)     Status: Abnormal   Collection Time: 10/20/21 10:20 AM  Result Value Ref Range   Troponin I (High Sensitivity) 74 (H) <18 ng/L  TSH     Status: Abnormal   Collection Time: 10/20/21 10:20 AM  Result Value Ref Range   TSH 35.047 (H) 0.350 - 4.500 uIU/mL  I-stat chem 8, ED     Status: Abnormal   Collection Time: 10/20/21 10:30 AM  Result Value Ref Range   Sodium 151 (H) 135 - 145 mmol/L   Potassium 3.7 3.5 - 5.1 mmol/L   Chloride 117 (H) 98 - 111 mmol/L   BUN 49 (H) 8 - 23 mg/dL   Creatinine, Ser 1.90 (H) 0.61 - 1.24 mg/dL   Glucose, Bld 124 (H) 70 - 99 mg/dL   Calcium, Ion 1.04 (L) 1.15 - 1.40 mmol/L   TCO2 21 (L) 22 - 32 mmol/L   Hemoglobin 16.0 13.0 - 17.0 g/dL   HCT 47.0 39.0 - 52.0 %  Resp Panel by RT-PCR (Flu A&B, Covid) Anterior Nasal Swab     Status: Abnormal   Collection Time: 10/20/21 10:38 AM   Specimen: Anterior Nasal Swab  Result Value Ref Range   SARS Coronavirus 2 by RT PCR POSITIVE (A) NEGATIVE   Influenza A by PCR NEGATIVE NEGATIVE   Influenza B by PCR NEGATIVE NEGATIVE   CTH 1. No acute intracranial abnormalities. 2. Chronic microvascular disease and brain atrophy.  CXR No acute cardiopulmonary abnormality.  CT neck/soft tissue Pharyngitis and supraglottitis.  No abscess or airway obstruction.  Assessment and Plan: Sepsis Pharyngitis/supraglottitis Thrush Odynophagia     - admit to inpt, SDU     - continue fluids, unasyn, steroids     - ENT has reviewed case, no acute surgical intervention at this time     - follow Bld Cx, lactic acid     - CLD as tolerated  COVID 19 infection     - remdes     - isolation     - PRN inhalers     - satting well on RA  Hypothyroidism ?Myxedema coma     - early  myxedema? Becoming hypothermic; he's lethargic     - labwise, not in adrenal crisis     - high dose levothyroxine today; add liothyronine for today as well     - continue steroids as above  AKI Dehydration     - fluids     - check renal US     - watch nephrotoxins  Dementia     - continue home regimen when he can tolerate PO  CAD     - continue home regimen when he can tolerate PO  HTN     - continue home regimen when he can tolerate PO  HLD     - continue home regimen when he can tolerate PO  Elevated trp     - no complaints of chest pain; trp are down trending     - EKG as above; he's apparently had several EKGs going back to June of this year w/ a fib/flutter on it, but no official diagnosis; in the past he was on metoprolol, but unable to tolerate rate blocking meds d/t symptomatic bradycardia  Advance Care Planning:   Code Status: DNR  Consults: None  Family Communication: w/ daughter by phone  Severity of Illness: The appropriate patient status for this patient is INPATIENT. Inpatient status  is judged to be reasonable and necessary in order to provide the required intensity of service to ensure the patient's safety. The patient's presenting symptoms, physical exam findings, and initial radiographic and laboratory data in the context of their chronic comorbidities is felt to place them at high risk for further clinical deterioration. Furthermore, it is not anticipated that the patient will be medically stable for discharge from the hospital within 2 midnights of admission.   * I certify that at the point of admission it is my clinical judgment that the patient will require inpatient hospital care spanning beyond 2 midnights from the point of admission due to high intensity of service, high risk for further deterioration and high frequency of surveillance required.*  Time spent in coordination of this H&P: 70 minutes  Author: Jonnie Finner, DO 10/20/2021 2:20 PM  For on  call review [email or web address].

## 2021-10-20 NOTE — ED Notes (Signed)
Only able to draw one culture bottle at this time

## 2021-10-20 NOTE — ED Provider Notes (Signed)
[facility] DEPT Provider Note   CSN: [id number] Arrival date & time: 10/20/21  0930     History  Chief Complaint  Patient presents with   Sore Throat   Fatigue    Cole Mayer is a 86 y.o. male.   Sore Throat  Patient presents for painful swallowing, and increased lethargy.  History is provided by his daughter.  He has advanced dementia at baseline.  Over the past week, she has noticed him grimacing with swallowing.  This includes fluids.  He currently resides in a skilled nursing facility.  He has reportedly still been able to take his daily medications.  He has been at the nursing facility following a prolonged hospital stay and deconditioning.  He has been working with physical therapy.  2 days ago, he had a good session with physical therapy.  Since yesterday, he has had generalized weakness and has not been able to get out of the bed.  Patient's daughter also has noticed a hoarseness to his voice that has been present over the past week.     Home Medications Prior to Admission medications   Medication Sig Start Date End Date Taking? Authorizing Provider  amLODipine (NORVASC) 2.5 MG tablet Take 2.5 mg by mouth See admin instructions. "Take 2.5 mg by mouth once a day with 4-8 ounces of water"   Yes [provider]  aspirin EC 81 MG tablet Take 81 mg by mouth daily.   Yes [provider]  donepezil (ARICEPT) 10 MG tablet Take 10 mg by mouth daily.   Yes [provider]  levothyroxine (SYNTHROID) 112 MCG tablet Take 112 mcg by mouth daily before breakfast.   Yes [provider]  melatonin 3 MG TABS tablet Take 3 mg by mouth at bedtime as needed (sleep).   Yes [provider]  memantine (NAMENDA) 10 MG tablet Take 10 mg by mouth daily.   Yes [provider]  QUEtiapine (SEROQUEL) 25 MG tablet Take 25 mg by mouth at bedtime as needed ("for paranoia, agitation, or insomnia"). 10/15/21 10/29/21 Yes  [provider]  sertraline (ZOLOFT) 25 MG tablet Take 25 mg by mouth daily.   Yes [provider]  simvastatin (ZOCOR) 40 MG tablet Take 40 mg by mouth at bedtime.   Yes [provider]  nitroGLYCERIN (NITROSTAT) 0.4 MG SL tablet Place 1 tablet (0.4 mg total) under the tongue every 5 (five) minutes x 3 doses as needed for chest pain (if no relief after 2nd dose, proceed to ED or call 911). Up to 3 doses. Patient not taking: Reported on 10/10/2021 04/24/21   Satira Sark, MD  polyethylene glycol (MIRALAX / GLYCOLAX) 17 g packet Take 17 g by mouth daily as needed for mild constipation. Patient not taking: Reported on 10/10/2021 07/19/21   Hosie Poisson, MD      Allergies    Patient has no known allergies.    Review of Systems   Review of Systems  Unable to perform ROS: Mental status change    Physical Exam Updated Vital Signs BP 116/68   Pulse 76   Temp (!) 96 F (35.6 C)   Resp 19   SpO2 99%  Physical Exam Constitutional:      General: He is not in acute distress.    Appearance: He is well-developed and normal weight. He is ill-appearing. He is not toxic-appearing or diaphoretic.  HENT:     Head: Normocephalic and atraumatic.  Mouth/Throat:     Comments: White plaque on tongue and buccal surfaces consistent with thrush Cardiovascular:     Rate and Rhythm: Normal rate and regular rhythm.  Pulmonary:     Effort: Pulmonary effort is normal. No respiratory distress.     Breath sounds: Normal breath sounds. No stridor. No wheezing, rhonchi or rales.  Abdominal:     General: There is no distension.     Palpations: Abdomen is soft.     Tenderness: There is no abdominal tenderness.  Skin:    General: Skin is warm and dry.  Neurological:     GCS: GCS eye subscore is 3. GCS verbal subscore is 1. GCS motor subscore is 6.     ED Results / Procedures / Treatments   Labs (all labs ordered are listed, but only abnormal results are displayed) Labs  Reviewed  RESP PANEL BY RT-PCR (FLU A&B, COVID) ARPGX2 - Abnormal; Notable for the following components:      Result Value   SARS Coronavirus 2 by RT PCR POSITIVE (*)    All other components within normal limits  COMPREHENSIVE METABOLIC PANEL - Abnormal; Notable for the following components:   Sodium 150 (*)    Chloride 118 (*)    CO2 21 (*)    Glucose, Bld 116 (*)    BUN 50 (*)    Creatinine, Ser 1.82 (*)    Calcium 8.8 (*)    Albumin 3.0 (*)    GFR, Estimated 36 (*)    All other components within normal limits  CBC WITH DIFFERENTIAL/PLATELET - Abnormal; Notable for the following components:   WBC 21.1 (*)    Neutro Abs 15.8 (*)    Monocytes Absolute 1.7 (*)    Basophils Absolute 0.3 (*)    Abs Immature Granulocytes 0.93 (*)    All other components within normal limits  TSH - Abnormal; Notable for the following components:   TSH 35.047 (*)    All other components within normal limits  LACTIC ACID, PLASMA - Abnormal; Notable for the following components:   Lactic Acid, Venous 2.6 (*)    All other components within normal limits  I-STAT CHEM 8, ED - Abnormal; Notable for the following components:   Sodium 151 (*)    Chloride 117 (*)    BUN 49 (*)    Creatinine, Ser 1.90 (*)    Glucose, Bld 124 (*)    Calcium, Ion 1.04 (*)    TCO2 21 (*)    All other components within normal limits  TROPONIN I (HIGH SENSITIVITY) - Abnormal; Notable for the following components:   Troponin I (High Sensitivity) 74 (*)    All other components within normal limits  TROPONIN I (HIGH SENSITIVITY) - Abnormal; Notable for the following components:   Troponin I (High Sensitivity) 63 (*)    All other components within normal limits  CULTURE, BLOOD (ROUTINE X 2)  CULTURE, BLOOD (ROUTINE X 2)  MAGNESIUM  BLOOD GAS, VENOUS  URINALYSIS, ROUTINE W REFLEX MICROSCOPIC  LACTIC ACID, PLASMA  T4, FREE  T3, FREE  AMMONIA    EKG EKG Interpretation  Date/Time:  Saturday October 20 2021 10:17:11  EDT Ventricular Rate:  82 PR Interval:    QRS Duration: 110 QT Interval:  447 QTC Calculation: 523 R Axis:   98 Text Interpretation: Atrial fibrillation Probable RVH w/ secondary repol abnormality Prolonged QT interval Confirmed by Godfrey Pick ([id number]) on 10/20/2021 12:20:53 PM  Radiology CT Soft Tissue Neck W Contrast  Result Date: 10/20/2021 CLINICAL DATA:  Epiglottitis or tonsillitis suspected. Thrush. Sore throat EXAM: CT NECK WITH CONTRAST TECHNIQUE: Multidetector CT imaging of the neck was performed using the standard protocol following the bolus administration of intravenous contrast. RADIATION DOSE REDUCTION: This exam was performed according to the departmental dose-optimization program which includes automated exposure control, adjustment of the mA and/or kV according to patient size and/or use of iterative reconstruction technique. CONTRAST:  18m OMNIPAQUE IOHEXOL 300 MG/ML  SOLN COMPARISON:  None Available. FINDINGS: Pharynx and larynx: Prominent mucosal enhancement in the pharynx and supraglottic larynx with some secretions or debris layering in the pharynx. Mild thickening of the epiglottis and uvula. No abscess or retropharyngeal effusion. Salivary glands: No inflammation, mass, or stone. Thyroid: Very atrophic and low-density Lymph nodes: None enlarged or abnormal density. Vascular: Scattered atheromatous calcification. Limited intracranial: Partial coverage of brain atrophy. Visualized orbits: No acute finding Mastoids and visualized paranasal sinuses: Clear Skeleton: Generalized cervical spine degeneration. No acute or aggressive finding Upper chest: Clear apical lungs IMPRESSION: Pharyngitis and supraglottitis.  No abscess or airway obstruction. Electronically Signed   By: JJorje GuildM.D.   On: 10/20/2021 11:56   DG Chest Portable 1 View  Result Date: 10/20/2021 CLINICAL DATA:  Lethargy. EXAM: PORTABLE CHEST 1 VIEW COMPARISON:  Chest radiograph 10/10/2021 and earlier FINDINGS:  The heart size and mediastinal contours are within normal limits. Both lungs are clear. No pleural effusion or pneumothorax. Multilevel degenerative changes in the thoracic spine. Mild-to-moderate osteoarthritis of the bilateral acromioclavicular and glenohumeral joints, right-greater-than-left. IMPRESSION: No acute cardiopulmonary abnormality. Electronically Signed   By: LIleana RoupM.D.   On: 10/20/2021 11:35   CT HEAD WO CONTRAST  Result Date: 10/20/2021 CLINICAL DATA:  Mental status change of unknown etiology. EXAM: CT HEAD WITHOUT CONTRAST TECHNIQUE: Contiguous axial images were obtained from the base of the skull through the vertex without intravenous contrast. RADIATION DOSE REDUCTION: This exam was performed according to the departmental dose-optimization program which includes automated exposure control, adjustment of the mA and/or kV according to patient size and/or use of iterative reconstruction technique. COMPARISON:  10/10/2021 FINDINGS: Brain: No evidence of acute infarction, hemorrhage, hydrocephalus, extra-axial collection or mass lesion/mass effect. There is mild diffuse low-attenuation within the subcortical and periventricular white matter compatible with chronic microvascular disease. Prominence of sulci and ventricles compatible with brain atrophy. Vascular: No hyperdense vessel or unexpected calcification. Skull: Normal. Negative for fracture or focal lesion. Sinuses/Orbits: No acute finding. Other: None. IMPRESSION: 1. No acute intracranial abnormalities. 2. Chronic microvascular disease and brain atrophy. Electronically Signed   By: TKerby MoorsM.D.   On: 10/20/2021 10:55    Procedures Procedures    Medications Ordered in ED Medications  lactated ringers infusion ( Intravenous New Bag/Given 10/20/21 1339)  nystatin (MYCOSTATIN) 100000 UNIT/ML suspension 500,000 Units (500,000 Units Oral Patient Refused/Not Given 10/20/21 1442)  levothyroxine (SYNTHROID, LEVOTHROID) injection  100 mcg (100 mcg Intravenous Given 10/20/21 1442)  lactated ringers bolus 1,000 mL (0 mLs Intravenous Stopped 10/20/21 1225)  fluconazole (DIFLUCAN) IVPB 200 mg (0 mg Intravenous Stopped 10/20/21 1438)  iohexol (OMNIPAQUE) 300 MG/ML solution 75 mL (75 mLs Intravenous Contrast Given 10/20/21 1130)  lactated ringers bolus 1,000 mL (0 mLs Intravenous Stopped 10/20/21 1438)  Ampicillin-Sulbactam (UNASYN) 3 g in sodium chloride 0.9 % 100 mL IVPB (0 g Intravenous Stopped 10/20/21 1507)  methylPREDNISolone sodium succinate (SOLU-MEDROL) 125 mg/2 mL injection 60 mg (60 mg Intravenous Given 10/20/21 1334)    ED Course/ Medical Decision Making/ A&P  Medical Decision Making Amount and/or Complexity of Data Reviewed Labs: ordered. Radiology: ordered.  Risk Prescription drug management.   This patient presents to the ED for concern of odynophagia and lethargy, this involves an extensive number of treatment options, and is a complaint that carries with it a high risk of complications and morbidity.  The differential diagnosis includes pharyngitis, esophagitis, dehydration, infection, sepsis, hypothyroidism, polypharmacy, CVA, ICH   Co morbidities that complicate the patient evaluation  HLD, HTN, CAD, dementia   Additional history obtained:  Additional history obtained from EMS, patient's daughter External records from outside source obtained and reviewed including EMR   Lab Tests:  I Ordered, and personally interpreted labs.  The pertinent results include: Leukocytosis and lactic acidosis consistent with sepsis.  Patient has AKI with hypernatremia and hyperchloremia.  BUN is elevated consistent with prerenal etiology.  TSH is markedly elevated in setting of known hypothyroidism.  COVID-19 testing is positive.   Imaging Studies ordered:  I ordered imaging studies including chest x-ray, CT head, CT neck I independently visualized and interpreted imaging which showed no  acute findings on chest x-ray or CT head.  CT neck showed pharyngitis and supraglottitis I agree with the radiologist interpretation   Cardiac Monitoring: / EKG:  The patient was maintained on a cardiac monitor.  I personally viewed and interpreted the cardiac monitored which showed an underlying rhythm of: Sinus rhythm with   Consultations Obtained:  I requested consultation with the otolaryngologist, Dr. Redmond Baseman,  and discussed lab and imaging findings as well as pertinent plan - they recommend: Admission to [facility] given no airway concerns over the past week.  He does recommend steroid for treatment of inflammation.  Presence of thrush does not change this recommendation.   Problem List / ED Course / Critical interventions / Medication management  Patient is 86 year old male with history of dementia, presenting from skilled nursing facility for 1 week of odynophagia, decreased p.o. intake, and 24 hours of lethargy.  Despite his odynophagia, nursing facility report ordered to his daughter that he has still taken his home medications.  On arrival, patient does open his eyes to voice and respond to commands.  He does not speak.  This may be secondary to a pharyngitis.  His daughter reports that he has had increased hoarseness of voice lately.  His breathing, however, is unlabored with no evidence of stridor.  No signs of breath vital signs all normal, including normal SPO2 on room air.  Patient does have a white plaque that is present on his tongue and buccal surfaces.  Still with thrush.  Given patient's p.o. intolerance, a IV dose of Diflucan was ordered and nystatin solution was ordered for when he is able to tolerate p.o.  Given his diminished p.o. intake, IV fluids were ordered.  Diagnostic work-up was initiated.  Labs showed multiple findings: AKI, minor electrolyte disturbances, leukocytosis, lactic acidosis, COVID-19 infection, and elevated TSH.  On CT imaging of his neck, there does appear  to be a pharyngitis and epiglottitis.  Patient was for treatment of pharyngitis.  Additional IV fluids were ordered for AKI sepsis.  I spoke with otolaryngologist on-call, Dr. Redmond Baseman, who plans admission here at [facility] long.  He also advised treatment with steroids for residual inflammation.  He does not feel that the presence of thrush indicates this.  Solu-Medrol was ordered.  Patient has a history of hypothyroidism and is currently on Synthroid, 100 g/day.  TSH today was markedly elevated at 35.  This does  raise concern for possible myxedema coma given his depressed mentation, however, lab work and vital signs suggest against this.  Temperature sensing Foley was ordered to further monitor.  He was given an IV dose of his home thyroid medication.  He was admitted to hospitalist for further management. I ordered medication including IV fluids and antibiotics for nystatin and Diflucan for thrush; Solu-Medrol for pharyngitis; Synthroid for elevated TSH Reevaluation of the patient after these medicines showed that the patient stayed the same I have reviewed the patients home medicines and have made adjustments as needed   Social Determinants of Health:  Lives in skilled nursing facility, history of dementia  CRITICAL CARE Performed by: Godfrey Pick   Total critical care time: 36 minutes  Critical care time was exclusive of separately billable procedures and treating other patients.  Critical care was necessary to treat or prevent imminent or life-threatening deterioration.  Critical care was time spent personally by me on the following activities: development of treatment plan with patient and/or surrogate as well as nursing, discussions with consultants, evaluation of patient's response to treatment, examination of patient, obtaining history from patient or surrogate, ordering and performing treatments and interventions, ordering and review of laboratory studies, ordering and review of radiographic  studies, pulse oximetry and re-evaluation of patient's condition.         Final Clinical Impression(s) / ED Diagnoses Final diagnoses:  Pharyngitis, unspecified etiology  Weakness  COVID-19  Acute kidney injury ([place])  Sepsis, due to unspecified organism, unspecified whether acute organ dysfunction present ([place])  Hypothyroidism, unspecified type  Thrush    Rx / DC Orders ED Discharge Orders     None         Godfrey Pick, MD 10/20/21 1614

## 2021-10-21 ENCOUNTER — Inpatient Hospital Stay (HOSPITAL_COMMUNITY): Payer: Medicare PPO

## 2021-10-21 DIAGNOSIS — J029 Acute pharyngitis, unspecified: Secondary | ICD-10-CM | POA: Diagnosis not present

## 2021-10-21 DIAGNOSIS — I10 Essential (primary) hypertension: Secondary | ICD-10-CM

## 2021-10-21 DIAGNOSIS — E860 Dehydration: Secondary | ICD-10-CM

## 2021-10-21 DIAGNOSIS — R1310 Dysphagia, unspecified: Secondary | ICD-10-CM

## 2021-10-21 DIAGNOSIS — N179 Acute kidney failure, unspecified: Secondary | ICD-10-CM

## 2021-10-21 DIAGNOSIS — R7989 Other specified abnormal findings of blood chemistry: Secondary | ICD-10-CM

## 2021-10-21 DIAGNOSIS — B370 Candidal stomatitis: Secondary | ICD-10-CM

## 2021-10-21 DIAGNOSIS — E782 Mixed hyperlipidemia: Secondary | ICD-10-CM

## 2021-10-21 DIAGNOSIS — E039 Hypothyroidism, unspecified: Secondary | ICD-10-CM

## 2021-10-21 DIAGNOSIS — F0390 Unspecified dementia without behavioral disturbance: Secondary | ICD-10-CM

## 2021-10-21 DIAGNOSIS — I2510 Atherosclerotic heart disease of native coronary artery without angina pectoris: Secondary | ICD-10-CM

## 2021-10-21 LAB — FERRITIN: Ferritin: 398 ng/mL — ABNORMAL HIGH (ref 24–336)

## 2021-10-21 LAB — PROCALCITONIN: Procalcitonin: <0.1 ng/mL

## 2021-10-21 LAB — PROTIME-INR
INR: 1.3 — ABNORMAL HIGH (ref 0.8–1.2)
Prothrombin Time: 15.7 seconds — ABNORMAL HIGH (ref 11.4–15.2)

## 2021-10-21 LAB — COMPREHENSIVE METABOLIC PANEL
ALT: 26 U/L (ref 0–44)
AST: 21 U/L (ref 15–41)
Albumin: 2.5 g/dL — ABNORMAL LOW (ref 3.5–5.0)
Alkaline Phosphatase: 32 U/L — ABNORMAL LOW (ref 38–126)
Anion gap: 9 (ref 5–15)
BUN: 46 mg/dL — ABNORMAL HIGH (ref 8–23)
CO2: 22 mmol/L (ref 22–32)
Calcium: 8 mg/dL — ABNORMAL LOW (ref 8.9–10.3)
Chloride: 119 mmol/L — ABNORMAL HIGH (ref 98–111)
Creatinine, Ser: 1.72 mg/dL — ABNORMAL HIGH (ref 0.61–1.24)
GFR, Estimated: 39 mL/min — ABNORMAL LOW (ref >60)
Glucose, Bld: 142 mg/dL — ABNORMAL HIGH (ref 70–99)
Potassium: 3.9 mmol/L (ref 3.5–5.1)
Sodium: 150 mmol/L — ABNORMAL HIGH (ref 135–145)
Total Bilirubin: 0.8 mg/dL (ref 0.3–1.2)
Total Protein: 5.4 g/dL — ABNORMAL LOW (ref 6.5–8.1)

## 2021-10-21 LAB — CBC WITH DIFFERENTIAL/PLATELET
Abs Immature Granulocytes: 0.62 10*3/uL — ABNORMAL HIGH (ref 0.00–0.07)
Basophils Absolute: 0.1 10*3/uL (ref 0.0–0.1)
Basophils Relative: 1 %
Eosinophils Absolute: 0 10*3/uL (ref 0.0–0.5)
Eosinophils Relative: 0 %
HCT: 41.3 % (ref 39.0–52.0)
Hemoglobin: 13.6 g/dL (ref 13.0–17.0)
Immature Granulocytes: 4 %
Lymphocytes Relative: 6 %
Lymphs Abs: 0.8 10*3/uL (ref 0.7–4.0)
MCH: 29.6 pg (ref 26.0–34.0)
MCHC: 32.9 g/dL (ref 30.0–36.0)
MCV: 90 fL (ref 80.0–100.0)
Monocytes Absolute: 0.2 10*3/uL (ref 0.1–1.0)
Monocytes Relative: 1 %
Neutro Abs: 12.3 10*3/uL — ABNORMAL HIGH (ref 1.7–7.7)
Neutrophils Relative %: 88 %
Platelets: 258 10*3/uL (ref 150–400)
RBC: 4.59 MIL/uL (ref 4.22–5.81)
RDW: 13.7 % (ref 11.5–15.5)
WBC: 14 10*3/uL — ABNORMAL HIGH (ref 4.0–10.5)
nRBC: 0 % (ref 0.0–0.2)

## 2021-10-21 LAB — LACTIC ACID, PLASMA
Lactic Acid, Venous: 1.4 mmol/L (ref 0.5–1.9)
Lactic Acid, Venous: 2 mmol/L (ref 0.5–1.9)

## 2021-10-21 LAB — C-REACTIVE PROTEIN: CRP: 7.3 mg/dL — ABNORMAL HIGH (ref <1.0)

## 2021-10-21 LAB — D-DIMER, QUANTITATIVE: D-Dimer, Quant: 1.06 ug/mL-FEU — ABNORMAL HIGH (ref 0.00–0.50)

## 2021-10-21 MED ORDER — HYDROCORTISONE SOD SUC (PF) 100 MG IJ SOLR
100.0000 mg | Freq: Three times a day (TID) | INTRAMUSCULAR | Status: DC
  Administered 2021-10-21 – 2021-10-22 (×3): 100 mg via INTRAVENOUS
  Filled 2021-10-21 (×3): qty 2

## 2021-10-21 MED ORDER — CHLORHEXIDINE GLUCONATE CLOTH 2 % EX PADS
6.0000 | MEDICATED_PAD | Freq: Every day | CUTANEOUS | Status: DC
  Administered 2021-10-21 – 2021-10-29 (×7): 6 via TOPICAL

## 2021-10-21 MED ORDER — LACTATED RINGERS IV BOLUS
1000.0000 mL | Freq: Once | INTRAVENOUS | Status: AC
  Administered 2021-10-21: 1000 mL via INTRAVENOUS

## 2021-10-21 MED ORDER — DEXTROSE-NACL 5-0.45 % IV SOLN: INTRAVENOUS | Status: AC

## 2021-10-21 NOTE — ED Notes (Signed)
Covered patient with a blanket due to a previous temperature of 97.0 C

## 2021-10-21 NOTE — Evaluation (Signed)
Clinical/Bedside Swallow Evaluation Patient Details  Name: Cole Mayer MRN: [id number] Date of Birth: 03-04-1935  Today's Date: 10/21/2021 Time: SLP Start Time (ACUTE ONLY): 1034 SLP Stop Time (ACUTE ONLY): 1046 SLP Time Calculation (min) (ACUTE ONLY): 12 min  Past Medical History:  Past Medical History:  Diagnosis Date   Coronary atherosclerosis of native coronary artery    DES LAD and DES x3 RCA 2005, LVEF 60%    Dementia ([place])    Essential hypertension    Hypothyroidism    Mixed hyperlipidemia    Myocardial infarction ([place])    1989 (Streptokinase) and 2005   Past Surgical History:  Past Surgical History:  Procedure Laterality Date   CHOLECYSTECTOMY     COLONOSCOPY N/A 01/18/2015   Procedure: COLONOSCOPY;  Surgeon: Rogene Houston, MD;  Location: [facility]O SUITE;  Service: Endoscopy;  Laterality: N/A;  730   CORONARY ANGIOPLASTY     Stents   TONSILLECTOMY     HPI:  Pt is an 86 y.o. male who presented with odynophagia. Pt was recently discharged to rehab to prep him for transition to memory care approximately one week prior to admission. While at rehab, pt was not eating and his daughter reportedly noticed wincing when swallowing and a hoarse voice. CT soft tissue: Prominent mucosal enhancement in the pharynx and  supraglottic larynx with some secretions or debris layering in the pharynx. Mild thickening of the epiglottis and uvula; Dx pharyngitis and supraglottitis. Pt found to have COVID-19 PMH: dementia, HTN, CAD, hypothyroidism, HLD.    Assessment / Plan / Recommendation  Clinical Impression  Pt was seen in the ED for bedside swallow evaluation. Pt was alert, but cooperation was limited. He was unable to participate in full oral mechanism exam due to his difficulty following commands and he was resistant to oral inspection. However, adequate, natural dentition was noted and he presented with a dry oral mucosa. Pt exhibited resistance to multiple trials. He tightly pursed his  lips when most trials were presented and boluses which entered the oral cavity were promptly wiped/spat out by the pt. SLP suspects that the pt's performance today is likely secondary to his advanced dementia and that p.o. acceptance may be improved with the presence/engagement of familiar parties (e.g., family). It is recommended that the pt's NPO status be maintained until further assessment can be conducted and SLP will follow pt. SLP Visit Diagnosis: Dysphagia, unspecified (R13.10)    Aspiration Risk       Diet Recommendation NPO   Medication Administration: Via alternative means Postural Changes: Seated upright at 90 degrees    Other  Recommendations Oral Care Recommendations: Oral care QID    Recommendations for follow up therapy are one component of a multi-disciplinary discharge planning process, led by the attending physician.  Recommendations may be updated based on patient status, additional functional criteria and insurance authorization.  Follow up Recommendations  (TBD)      Assistance Recommended at Discharge Frequent or constant Supervision/Assistance  Functional Status Assessment Patient has had a recent decline in their functional status and demonstrates the ability to make significant improvements in function in a reasonable and predictable amount of time.  Frequency and Duration min 2x/week  2 weeks       Prognosis Prognosis for Safe Diet Advancement: Good Barriers to Reach Goals: Cognitive deficits;Severity of deficits      Swallow Study   General Date of Onset: 10/20/21 HPI: Pt is an 86 y.o. male who presented with odynophagia. Pt was recently  discharged to rehab to prep him for transition to memory care approximately one week prior to admission. While at rehab, pt was not eating and his daughter reportedly noticed wincing when swallowing and a hoarse voice. CT soft tissue: Prominent mucosal enhancement in the pharynx and  supraglottic larynx with some  secretions or debris layering in the pharynx. Mild thickening of the epiglottis and uvula; Dx pharyngitis and supraglottitis. Pt found to have COVID-19 PMH: dementia, HTN, CAD, hypothyroidism, HLD. Type of Study: Bedside Swallow Evaluation Previous Swallow Assessment: none Diet Prior to this Study: NPO Temperature Spikes Noted: No Respiratory Status: Room air History of Recent Intubation: No Behavior/Cognition: Alert;Doesn't follow directions;Confused;Requires cueing Oral Cavity Assessment: Dry Oral Care Completed by SLP: No (pt resistant to oral care) Oral Cavity - Dentition: Adequate natural dentition Self-Feeding Abilities: Total assist Patient Positioning: Upright in bed;Postural control adequate for testing Baseline Vocal Quality:  (limited outpit) Volitional Cough: Cognitively unable to elicit Volitional Swallow: Unable to elicit    Oral/Motor/Sensory Function Overall Oral Motor/Sensory Function:  (UTA)   Ice Chips Ice chips: Impaired Presentation: Spoon Oral Phase Impairments: Poor awareness of bolus (Pt spat out boluses)   Thin Liquid Thin Liquid: Impaired (pt chewed on straw when it was presented and pushed cup away)    Nectar Thick Nectar Thick Liquid: Not tested   Honey Thick Honey Thick Liquid: Not tested   Puree Puree: Impaired Presentation: Spoon Oral Phase Impairments: Poor awareness of bolus (pt consistently wiped boluses out of mouth)  Solid     Solid: Not tested     [name] I. Hardin Negus, [place], [place] Office number [phone]  Horton Marshall 10/21/2021,10:53 AM

## 2021-10-21 NOTE — Progress Notes (Signed)
Pt arrived on the unit, A/Ox1, RA, foley, IV fluids.  Irritable, follows commands. Bear hugger applied. VS stable

## 2021-10-21 NOTE — Progress Notes (Addendum)
    OVERNIGHT PROGRESS REPORT  Notified by RN for unwitnessed fall.  This is an 86 year old male with past medical history of advanced dementia, essential hypertension, CAD, hypothyroidism, hyperlipidemia, anxiety/depression. Patient is advanced dementia unable to participate in HPI.  Patient had unwitnessed fall as reported by nursing staff, resulting in bleeding from arm abrasions.Unknown if any involvement of head.   Stat CT head ordered. As in previous notes patient is unable to participate in assessment.  Patient is on: "DVT prophylaxis: enoxaparin (LOVENOX) injection 30 mg Start: 10/20/21 2215"  STAT CT head pending. ========================================= Update 10/21/2021 @ 2146 Hrs.  "... COMPARISON:  10/20/2021   FINDINGS: Brain: No evidence of acute infarction, hemorrhage, mass, mass effect, or midline shift. No hydrocephalus or extra-axial fluid collection. Periventricular white matter changes, likely the sequela of chronic small vessel ischemic disease. Advanced cerebral atrophy for age with commensurate size of the ventricles and sulci.   Vascular: No hyperdense vessel.   Skull: Normal. Negative for fracture or focal lesion.   Sinuses/Orbits: Minimal mucosal thickening in the ethmoid air cells. Status post bilateral lens replacements.   Other: The mastoid air cells are well aerated.   IMPRESSION: No acute intracranial process.     Electronically Signed   By: Merilyn Baba M.D.   On: 10/21/2021 21:30 "    Gershon Cull MSNA MSN ACNPC-AG Acute Care Nurse Practitioner [place]

## 2021-10-21 NOTE — Progress Notes (Signed)
PROGRESS NOTE    PAYTON PRINSEN  OEU:[id number] DOB: 11-03-1935 DOA: 10/20/2021 PCP: Patient, No Pcp Per    Brief Narrative:   JASAUN CARN is a 86 y.o. male with past medical history significant for advanced dementia, essential hypertension, CAD, hypothyroidism, hyperlipidemia, anxiety/depression who presented to [facility] ED on 9/30 via ambulance from [facility] with lethargy, difficulty swallowing over the last 2 days.  Patient with advanced dementia unable to participate in HPI.  Staff reported that no oral intake over the last 2 days.  At baseline minimally verbal.  History also obtained from daughter who reports recently discharged from [facility] 1 week ago.  While at a rehab, reported not eating and his daughter noticed that he was wincing when he was swallowing as if you are in pain.  No reported fevers or nausea/vomiting.  When his symptoms did not improve she requested the facility sent him to the ED for further evaluation.  In the ED, temperature 96.0 F, HR 74, RR 13, BP 141/79, SPO2 98% on room air.  WBC 21.1, hemoglobin 16.5, platelets 316.  Sodium 150, potassium 3.8, chloride 118, CO2 21, glucose 116, BUN 50, creatinine 1.82.  AST 30, ALT 34, total bilirubin 0.8.  High sensitive troponin 74> 63.  Lactic acid 2.6.  INR 1.3.  TSH 35.047.  COVID-19 PCR positive.  Influenza A/B PCR negative.  Urinalysis unrevealing.  CT head without contrast with no acute intracranial normalities, but notable chronic microvascular disease and brain atrophy.  CT soft tissue neck with pharyngitis/supraglottitis, no abscess or airway obstruction.  Chest x-ray with no acute cardiopulmonary disease process.  Blood cultures x2 obtained.  EDP discussed with ENT, Dr. Redmond Baseman who recommended medical admission with IV antibiotics, IV steroids.  TRH consulted for admission for further evaluation and treatment.  Assessment & Plan:   Acute metabolic encephalopathy Patient presenting to ED with from SNF with  lethargy, reduced oral intake in the setting of underlying advanced dementia.  Recently discharged from [facility].  Etiology likely multifactorial with active infection with pharyngitis/supraglottitis, COVID-19 viral infection, and possible underlying myxedema. --Supportive care, treatment as below  COVID-19 viral infection COVID-19 PCR positive.  Chest x-ray with no acute cardiopulmonary disease process.  Unable to tolerate oral intake. --Remdesivir --Continue monitoring of SPO2  Pharyngitis/supraglottitis WBC count elevated 21.1 on admission.  CT soft tissue neck with pharyngitis/supraglottitis with no abscess or airway obstruction.  EDP discussed with ENT on-call, Dr. Redmond Baseman who recommended medical admission with IV antibiotics, IV steroids. --WBC 21.1>14.0 --Unasyn 3 g IV every 6 hours --Will change Solu-Medrol to hydrocortisone 100 mg IV every 8 hours for possible myxedema coma as below --Supportive care --CBC daily  Oropharyngeal candidiasis --Fluconazole 50 mg IV every 24 hours  Hypothyroidism Possible myxedema Patient presenting with confusion from his typical baseline, lethargy.  TSH elevated at 35.  Unlikely taking his medications.  Patient was notably hypothermic leading to concern for possible myxedema. -- Cortisol level: Pending -- Free T4/free T3: Pending -- Levothyroxine 100 mcg IV daily -- Liothyronine 2.5 mcg IV every 8 hours -- Hydrocortisone 100 mg IV q8h  -- Bair hugger --TSH, Free T3 and Free T4 daily  Acute renal failure on CKD stage IIIb Creatinine elevated 1.90 on admission.  Baseline of 1.24 on 10/10/2021.  Etiology likely secondary to severe dehydration in the setting of inability to tolerate oral intake.  Renal ultrasounds with mild left hydronephrosis, 4 mm calculus inferior left kidney, fullness of the right renal pelvis without  frank hydronephrosis, slightly decreased size right kidney in comparison to ultrasound in 2021, limited exam due to overlying  bowel gas. --Cr 1.90>1.72 --Continue IV fluid hydration --Avoid nephrotoxins, renal dose all medications --Repeat BMP in a.m.  Essential hypertension On amlodipine 2.5 mg p.o. daily at home.  BP 125/76, well controlled. --Continue to hold antihypertensives, unable to tolerate oral intake at this time --Hydralazine '10mg'$  IV q6h PRN SBP >165 or DBP >110  CAD --Holding home aspirin due to intolerance of oral intake  Hyperlipidemia On simvastatin 40 mg daily at home. -- Holding oral medications at this time  Dysphagia -- SLP following, currently recommend n.p.o.  Advanced dementia --Delirium precautions --Get up during the day --Encourage a familiar face to remain present throughout the day --Keep blinds open and lights on during daylight hours --Minimize the use of opioids/benzodiazepines --Holding home benazepril and Namenda due to inability to tolerate oral intake at this time   DVT prophylaxis: enoxaparin (LOVENOX) injection 30 mg Start: 10/20/21 2215    Code Status: DNR Family Communication: No family present at bedside this morning  Disposition Plan:  Level of care: Stepdown Status is: Inpatient Remains inpatient appropriate because: Remains lethargic, unable to tolerate p.o. intake, concern for myxedema coma, remains hypothermic, IV steroids, IV antibiotics, overall prognosis guarded given comorbidities and advanced dementia.    Consultants:  Palliative care  Procedures:  Renal ultrasound  Antimicrobials:  Unasyn 9/30>> Fluconazole 9/30>> Remdesivir 9/30>>   Subjective: Patient seen and examined at bedside, resting comfortably.  Pleasantly confused and ill in appearance.  No family present.  Seen by speech therapy, unable to tolerate oral intake and remain NPO.  Unable to obtain any further ROS from patient this morning due to his mental status and lethargy.  No acute concerns overnight per nursing staff.  Objective: Vitals:   10/21/21 0800 10/21/21 0900  10/21/21 1100 10/21/21 1200  BP: 124/67 130/70 114/65 112/64  Pulse: 77 73 70 71  Resp: '12 16 13 14  '$ Temp: (!) 97 F (36.1 C)  (!) 95.2 F (35.1 C) (!) 95.2 F (35.1 C)  TempSrc:      SpO2: 97% 100% 97% 95%    Intake/Output Summary (Last 24 hours) at 10/21/2021 1341 Last data filed at 10/20/2021 2251 Gross per 24 hour  Intake 2379.64 ml  Output --  Net 2379.64 ml   There were no vitals filed for this visit.  Examination:  Physical Exam: GEN: NAD, chronically ill in appearance, confused/lethargic HEENT: NCAT, PERRL, EOMI, sclera clear, dry mucous membranes with white patches to posterior pharynx and mild erythema/edema PULM: CTAB w/o wheezes/crackles, normal respiratory effort, on room air CV: RRR w/o M/G/R GI: abd soft, NTND, NABS, no R/G/M MSK: no peripheral edema, moves all extremities independently Integumentary: dry/intact, no rashes or wounds    Data Reviewed: I have personally reviewed following labs and imaging studies  CBC: Recent Labs  Lab 10/20/21 1020 10/20/21 1030 10/21/21 0300  WBC 21.1*  --  14.0*  NEUTROABS 15.8*  --  12.3*  HGB 16.5 16.0 13.6  HCT 51.1 47.0 41.3  MCV 91.3  --  90.0  PLT 316  --  003   Basic Metabolic Panel: Recent Labs  Lab 10/20/21 1020 10/20/21 1030 10/21/21 0300  NA 150* 151* 150*  K 3.8 3.7 3.9  CL 118* 117* 119*  CO2 21*  --  22  GLUCOSE 116* 124* 142*  BUN 50* 49* 46*  CREATININE 1.82* 1.90* 1.72*  CALCIUM 8.8*  --  8.0*  MG 2.1  --   --    GFR: Estimated Creatinine Clearance: 35.1 mL/min (A) (by C-G formula based on SCr of 1.72 mg/dL (H)). Liver Function Tests: Recent Labs  Lab 10/20/21 1020 10/21/21 0300  AST 30 21  ALT 34 26  ALKPHOS 42 32*  BILITOT 0.8 0.8  PROT 6.7 5.4*  ALBUMIN 3.0* 2.5*   No results for input(s): "LIPASE", "AMYLASE" in the last 168 hours. Recent Labs  Lab 10/20/21 1545  AMMONIA 14   Coagulation Profile: Recent Labs  Lab 10/21/21 0300  INR 1.3*   Cardiac Enzymes: No  results for input(s): "CKTOTAL", "CKMB", "CKMBINDEX", "TROPONINI" in the last 168 hours. BNP (last 3 results) No results for input(s): "PROBNP" in the last 8760 hours. HbA1C: No results for input(s): "HGBA1C" in the last 72 hours. CBG: No results for input(s): "GLUCAP" in the last 168 hours. Lipid Profile: No results for input(s): "CHOL", "HDL", "LDLCALC", "TRIG", "CHOLHDL", "LDLDIRECT" in the last 72 hours. Thyroid Function Tests: Recent Labs    10/20/21 1020  TSH 35.047*   Anemia Panel: Recent Labs    10/21/21 0300  FERRITIN 398*   Sepsis Labs: Recent Labs  Lab 10/20/21 1338 10/21/21 0300 10/21/21 0655  PROCALCITON  --  <0.10  --   LATICACIDVEN 2.6* 1.4 2.0*    Recent Results (from the past 240 hour(s))  Resp Panel by RT-PCR (Flu A&B, Covid) Anterior Nasal Swab     Status: Abnormal   Collection Time: 10/20/21 10:38 AM   Specimen: Anterior Nasal Swab  Result Value Ref Range Status   SARS Coronavirus 2 by RT PCR POSITIVE (A) NEGATIVE Final    Comment: (NOTE) SARS-CoV-2 target nucleic acids are DETECTED.  The SARS-CoV-2 RNA is generally detectable in upper respiratory specimens during the acute phase of infection. Positive results are indicative of the presence of the identified virus, but do not rule out bacterial infection or co-infection with other pathogens not detected by the test. Clinical correlation with patient history and other diagnostic information is necessary to determine patient infection status. The expected result is Negative.  Fact Sheet for Patients: EntrepreneurPulse.com.au  Fact Sheet for Healthcare Providers: IncredibleEmployment.be  This test is not yet approved or cleared by the [place] FDA and  has been authorized for detection and/or diagnosis of SARS-CoV-2 by FDA under an Emergency Use Authorization (EUA).  This EUA will remain in effect (meaning this test can be used) for the duration of  the  COVID-19 declaration under Section 564(b)(1) of the A ct, 21 U.S.C. section 360bbb-3(b)(1), unless the authorization is terminated or revoked sooner.     Influenza A by PCR NEGATIVE NEGATIVE Final   Influenza B by PCR NEGATIVE NEGATIVE Final    Comment: (NOTE) The Xpert Xpress SARS-CoV-2/FLU/RSV plus assay is intended as an aid in the diagnosis of influenza from Nasopharyngeal swab specimens and should not be used as a sole basis for treatment. Nasal washings and aspirates are unacceptable for Xpert Xpress SARS-CoV-2/FLU/RSV testing.  Fact Sheet for Patients: EntrepreneurPulse.com.au  Fact Sheet for Healthcare Providers: IncredibleEmployment.be  This test is not yet approved or cleared by the [place] FDA and has been authorized for detection and/or diagnosis of SARS-CoV-2 by FDA under an Emergency Use Authorization (EUA). This EUA will remain in effect (meaning this test can be used) for the duration of the COVID-19 declaration under Section 564(b)(1) of the Act, 21 U.S.C. section 360bbb-3(b)(1), unless the authorization is terminated or revoked.  Performed at [facility],  [place] [address], [place], [place] [address]          Radiology Studies: US RENAL  Result Date: 10/20/2021 CLINICAL DATA:  Acute kidney injury EXAM: RENAL / URINARY TRACT ULTRASOUND COMPLETE COMPARISON:  10/01/2021 CT abdomen pelvis, 12/14/2019 renal ultrasound FINDINGS: Evaluation is somewhat limited due to overlying bowel gas. Right Kidney: Renal measurements: 8.0 x 4.0 x 3.1 cm = volume: 52 mL. On the prior ultrasound, the right kidney head a volume of 162 mL. Cortical thinning. Echogenicity within normal limits. No mass or hydronephrosis visualized, although the pelvis appears somewhat full. Left Kidney: Renal measurements: 12.1 x 5.8 x 4.4 cm = volume: 161 mL. Cortical thinning. Echogenicity within normal limits. Mild hydronephrosis. A shadowing  calculus is noted in the inferior left kidney, which measures up to 4 mm. Bladder: Foley present.  The bladder is empty. Other: None. IMPRESSION: 1. Evaluation is somewhat limited by overlying bowel gas. Within this limitation, the right kidney appears to have decreased in size compared to the 12/14/2019 renal ultrasound. 2. Mild left hydronephrosis. 3. 4 mm calculus in the inferior left kidney. 4. Fullness of the right renal pelvis without frank hydronephrosis. Electronically Signed   By: Merilyn Baba M.D.   On: 10/20/2021 23:18   CT Soft Tissue Neck W Contrast  Result Date: 10/20/2021 CLINICAL DATA:  Epiglottitis or tonsillitis suspected. Thrush. Sore throat EXAM: CT NECK WITH CONTRAST TECHNIQUE: Multidetector CT imaging of the neck was performed using the standard protocol following the bolus administration of intravenous contrast. RADIATION DOSE REDUCTION: This exam was performed according to the departmental dose-optimization program which includes automated exposure control, adjustment of the mA and/or kV according to patient size and/or use of iterative reconstruction technique. CONTRAST:  84m OMNIPAQUE IOHEXOL 300 MG/ML  SOLN COMPARISON:  None Available. FINDINGS: Pharynx and larynx: Prominent mucosal enhancement in the pharynx and supraglottic larynx with some secretions or debris layering in the pharynx. Mild thickening of the epiglottis and uvula. No abscess or retropharyngeal effusion. Salivary glands: No inflammation, mass, or stone. Thyroid: Very atrophic and low-density Lymph nodes: None enlarged or abnormal density. Vascular: Scattered atheromatous calcification. Limited intracranial: Partial coverage of brain atrophy. Visualized orbits: No acute finding Mastoids and visualized paranasal sinuses: Clear Skeleton: Generalized cervical spine degeneration. No acute or aggressive finding Upper chest: Clear apical lungs IMPRESSION: Pharyngitis and supraglottitis.  No abscess or airway obstruction.  Electronically Signed   By: JJorje GuildM.D.   On: 10/20/2021 11:56   DG Chest Portable 1 View  Result Date: 10/20/2021 CLINICAL DATA:  Lethargy. EXAM: PORTABLE CHEST 1 VIEW COMPARISON:  Chest radiograph 10/10/2021 and earlier FINDINGS: The heart size and mediastinal contours are within normal limits. Both lungs are clear. No pleural effusion or pneumothorax. Multilevel degenerative changes in the thoracic spine. Mild-to-moderate osteoarthritis of the bilateral acromioclavicular and glenohumeral joints, right-greater-than-left. IMPRESSION: No acute cardiopulmonary abnormality. Electronically Signed   By: LIleana RoupM.D.   On: 10/20/2021 11:35   CT HEAD WO CONTRAST  Result Date: 10/20/2021 CLINICAL DATA:  Mental status change of unknown etiology. EXAM: CT HEAD WITHOUT CONTRAST TECHNIQUE: Contiguous axial images were obtained from the base of the skull through the vertex without intravenous contrast. RADIATION DOSE REDUCTION: This exam was performed according to the departmental dose-optimization program which includes automated exposure control, adjustment of the mA and/or kV according to patient size and/or use of iterative reconstruction technique. COMPARISON:  10/10/2021 FINDINGS: Brain: No evidence of acute infarction, hemorrhage, hydrocephalus, extra-axial collection or mass lesion/mass effect.  There is mild diffuse low-attenuation within the subcortical and periventricular white matter compatible with chronic microvascular disease. Prominence of sulci and ventricles compatible with brain atrophy. Vascular: No hyperdense vessel or unexpected calcification. Skull: Normal. Negative for fracture or focal lesion. Sinuses/Orbits: No acute finding. Other: None. IMPRESSION: 1. No acute intracranial abnormalities. 2. Chronic microvascular disease and brain atrophy. Electronically Signed   By: Kerby Moors M.D.   On: 10/20/2021 10:55        Scheduled Meds:  enoxaparin (LOVENOX) injection  30 mg  Subcutaneous Q24H   levothyroxine  100 mcg Intravenous Daily   Liothyronine Sodium  2.5 mcg Intravenous Q8H   methylPREDNISolone (SOLU-MEDROL) injection  40 mg Intravenous Daily   Continuous Infusions:  ampicillin-sulbactam (UNASYN) IV Stopped (10/21/21 0958)   fluconazole (DIFLUCAN) IV     remdesivir 100 mg in sodium chloride 0.9 % 100 mL IVPB 100 mg (10/21/21 1030)     LOS: 1 day    Time spent: 56 minutes spent on chart review, discussion with nursing staff, consultants, updating family and interview/physical exam; more than 50% of that time was spent in counseling and/or coordination of care.    Riddick Nuon J [place] DO [facility] Available via Epic secure chat 7am-7pm After these hours, please refer to coverage provider listed on [email or web address] 10/21/2021, 1:41 PM

## 2021-10-22 DIAGNOSIS — Z7189 Other specified counseling: Secondary | ICD-10-CM

## 2021-10-22 DIAGNOSIS — R531 Weakness: Secondary | ICD-10-CM

## 2021-10-22 DIAGNOSIS — U071 COVID-19: Secondary | ICD-10-CM

## 2021-10-22 DIAGNOSIS — I2510 Atherosclerotic heart disease of native coronary artery without angina pectoris: Secondary | ICD-10-CM | POA: Diagnosis not present

## 2021-10-22 DIAGNOSIS — J029 Acute pharyngitis, unspecified: Secondary | ICD-10-CM | POA: Diagnosis not present

## 2021-10-22 DIAGNOSIS — Z515 Encounter for palliative care: Secondary | ICD-10-CM

## 2021-10-22 DIAGNOSIS — L8990 Pressure ulcer of unspecified site, unspecified stage: Secondary | ICD-10-CM | POA: Insufficient documentation

## 2021-10-22 DIAGNOSIS — F0390 Unspecified dementia without behavioral disturbance: Secondary | ICD-10-CM | POA: Diagnosis not present

## 2021-10-22 DIAGNOSIS — E039 Hypothyroidism, unspecified: Secondary | ICD-10-CM

## 2021-10-22 DIAGNOSIS — N179 Acute kidney failure, unspecified: Secondary | ICD-10-CM | POA: Diagnosis not present

## 2021-10-22 LAB — COMPREHENSIVE METABOLIC PANEL
ALT: 24 U/L (ref 0–44)
AST: 26 U/L (ref 15–41)
Albumin: 2.5 g/dL — ABNORMAL LOW (ref 3.5–5.0)
Alkaline Phosphatase: 29 U/L — ABNORMAL LOW (ref 38–126)
Anion gap: 8 (ref 5–15)
BUN: 44 mg/dL — ABNORMAL HIGH (ref 8–23)
CO2: 23 mmol/L (ref 22–32)
Calcium: 7.8 mg/dL — ABNORMAL LOW (ref 8.9–10.3)
Chloride: 119 mmol/L — ABNORMAL HIGH (ref 98–111)
Creatinine, Ser: 1.56 mg/dL — ABNORMAL HIGH (ref 0.61–1.24)
GFR, Estimated: 44 mL/min — ABNORMAL LOW (ref >60)
Glucose, Bld: 148 mg/dL — ABNORMAL HIGH (ref 70–99)
Potassium: 3.3 mmol/L — ABNORMAL LOW (ref 3.5–5.1)
Sodium: 150 mmol/L — ABNORMAL HIGH (ref 135–145)
Total Bilirubin: 0.7 mg/dL (ref 0.3–1.2)
Total Protein: 5.4 g/dL — ABNORMAL LOW (ref 6.5–8.1)

## 2021-10-22 LAB — CBC WITH DIFFERENTIAL/PLATELET
Abs Immature Granulocytes: 0.35 10*3/uL — ABNORMAL HIGH (ref 0.00–0.07)
Basophils Absolute: 0.1 10*3/uL (ref 0.0–0.1)
Basophils Relative: 0 %
Eosinophils Absolute: 0 10*3/uL (ref 0.0–0.5)
Eosinophils Relative: 0 %
HCT: 43.2 % (ref 39.0–52.0)
Hemoglobin: 13.9 g/dL (ref 13.0–17.0)
Immature Granulocytes: 2 %
Lymphocytes Relative: 3 %
Lymphs Abs: 0.7 10*3/uL (ref 0.7–4.0)
MCH: 29.4 pg (ref 26.0–34.0)
MCHC: 32.2 g/dL (ref 30.0–36.0)
MCV: 91.5 fL (ref 80.0–100.0)
Monocytes Absolute: 1 10*3/uL (ref 0.1–1.0)
Monocytes Relative: 5 %
Neutro Abs: 19.4 10*3/uL — ABNORMAL HIGH (ref 1.7–7.7)
Neutrophils Relative %: 90 %
Platelets: 272 10*3/uL (ref 150–400)
RBC: 4.72 MIL/uL (ref 4.22–5.81)
RDW: 13.6 % (ref 11.5–15.5)
WBC: 21.5 10*3/uL — ABNORMAL HIGH (ref 4.0–10.5)
nRBC: 0 % (ref 0.0–0.2)

## 2021-10-22 LAB — C-REACTIVE PROTEIN: CRP: 4.4 mg/dL — ABNORMAL HIGH (ref <1.0)

## 2021-10-22 LAB — MAGNESIUM: Magnesium: 1.9 mg/dL (ref 1.7–2.4)

## 2021-10-22 LAB — CORTISOL-AM, BLOOD: Cortisol - AM: 8.1 ug/dL (ref 6.7–22.6)

## 2021-10-22 LAB — D-DIMER, QUANTITATIVE: D-Dimer, Quant: 1.57 ug/mL-FEU — ABNORMAL HIGH (ref 0.00–0.50)

## 2021-10-22 LAB — TSH: TSH: 5.841 u[IU]/mL — ABNORMAL HIGH (ref 0.350–4.500)

## 2021-10-22 LAB — LACTIC ACID, PLASMA: Lactic Acid, Venous: 2.3 mmol/L (ref 0.5–1.9)

## 2021-10-22 LAB — T4, FREE: Free T4: 0.99 ng/dL (ref 0.61–1.12)

## 2021-10-22 LAB — PHOSPHORUS: Phosphorus: 3.6 mg/dL (ref 2.5–4.6)

## 2021-10-22 LAB — FERRITIN: Ferritin: 363 ng/mL — ABNORMAL HIGH (ref 24–336)

## 2021-10-22 MED ORDER — ORAL CARE MOUTH RINSE: 15.0000 mL | OROMUCOSAL | Status: DC | PRN

## 2021-10-22 MED ORDER — ORAL CARE MOUTH RINSE
15.0000 mL | OROMUCOSAL | Status: DC
  Administered 2021-10-22 – 2021-10-26 (×11): 15 mL via OROMUCOSAL

## 2021-10-22 MED ORDER — ENOXAPARIN SODIUM 40 MG/0.4ML IJ SOSY
40.0000 mg | PREFILLED_SYRINGE | INTRAMUSCULAR | Status: DC
  Administered 2021-10-22 – 2021-10-24 (×3): 40 mg via SUBCUTANEOUS
  Filled 2021-10-22 (×3): qty 0.4

## 2021-10-22 MED ORDER — HYDROCORTISONE SOD SUC (PF) 100 MG IJ SOLR
50.0000 mg | Freq: Three times a day (TID) | INTRAMUSCULAR | Status: DC
  Administered 2021-10-22 – 2021-10-26 (×11): 50 mg via INTRAVENOUS
  Filled 2021-10-22 (×18): qty 1
  Filled 2021-10-22: qty 2

## 2021-10-22 MED ORDER — POTASSIUM CHLORIDE 10 MEQ/100ML IV SOLN
10.0000 meq | INTRAVENOUS | Status: AC
  Administered 2021-10-22 (×4): 10 meq via INTRAVENOUS
  Filled 2021-10-22 (×4): qty 100

## 2021-10-22 MED ORDER — POTASSIUM CHLORIDE 20 MEQ PO PACK: 20.0000 meq | PACK | Freq: Once | ORAL | Status: DC

## 2021-10-22 MED ORDER — HALOPERIDOL LACTATE 5 MG/ML IJ SOLN: 1.0000 mg | Freq: Four times a day (QID) | INTRAMUSCULAR | Status: DC | PRN

## 2021-10-22 NOTE — Progress Notes (Signed)
Speech Language Pathology Treatment: Dysphagia  Patient Details Name: Cole Mayer MRN: [id number] DOB: 05/27/1935 Today's Date: 10/22/2021 Time: 1400-1420 SLP Time Calculation (min) (ACUTE ONLY): 20 min  Assessment / Plan / Recommendation Clinical Impression  Patient seen by SLP for skilled treatment session focused on dysphagia goals. Patient was awake, alert in bed but continues to be significantly confused secondary to h/o dementia. Patient only allowed for minimal oral care with SLP cleaning his teeth with toothbrush and toothette sponge. He would not allow for SLP to clean further back in mouth or to adequately examine palate. He initially accepted a small ice chip and did exhibit some mastication but he then proceeded to spit out remaining bits of ice. When straw presented to his mouth he did accept it but proceeded to chew at straw and made no attempts to drink. SLP then presented water via cup and patient did take a few small sips. No overt s/s aspiration or penetration but SLP did suspect swallow initiation delay. SLP recommending continue NPO but allow ice/water PRN after oral care. SLP will continue to follow patient for PO readiness but currently main barrier to this is his cognition, leading to PO refusals.    HPI HPI: Pt is an 86 y.o. male who presented with odynophagia. Pt was recently discharged to rehab to prep him for transition to memory care approximately one week prior to admission. While at rehab, pt was not eating and his daughter reportedly noticed wincing when swallowing and a hoarse voice. CT soft tissue: Prominent mucosal enhancement in the pharynx and  supraglottic larynx with some secretions or debris layering in the pharynx. Mild thickening of the epiglottis and uvula; Dx pharyngitis and supraglottitis. Pt found to have COVID-19 PMH: dementia, HTN, CAD, hypothyroidism, HLD.      SLP Plan  Continue with current plan of care      Recommendations for follow up therapy  are one component of a multi-disciplinary discharge planning process, led by the attending physician.  Recommendations may be updated based on patient status, additional functional criteria and insurance authorization.    Recommendations  Diet recommendations: NPO;Other(comment) (PRN ice/water sips) Liquids provided via: Cup;Teaspoon Medication Administration: Via alternative means Supervision: Trained caregiver to feed patient;Full supervision/cueing for compensatory strategies Compensations: Slow rate;Small sips/bites;Minimize environmental distractions Postural Changes and/or Swallow Maneuvers: Seated upright 90 degrees                Oral Care Recommendations: Oral care QID;Oral care prior to ice chip/H20;Staff/trained caregiver to provide oral care Follow Up Recommendations: Other (comment) (TBD) Assistance recommended at discharge: Frequent or constant Supervision/Assistance SLP Visit Diagnosis: Dysphagia, unspecified (R13.10) Plan: Continue with current plan of care          Sonia Baller, MA, CCC-SLP Speech Therapy

## 2021-10-22 NOTE — Progress Notes (Signed)
Pt confused and tried to get out of bed; [place] out the bed, bruise his arms, few scratches on elbows;  RN tried to intervene, got the skylift to get pt up; dressings on new bleeding site; CT scan; floor mats; NP notified/ aware of fall, non -violent restraint ordered. Post fall flowsheet completed; pt alert; lying comfortably in bed.

## 2021-10-22 NOTE — TOC Progression Note (Signed)
Transition of Care [facility]) - Progression Note    Patient Details  Name: Cole Mayer MRN: [id number] Date of Birth: [birth year]  [facility] CM/SW Contact  Ross Ludwig, [place] Phone Number: 10/22/2021, 11:34 AM  Clinical Narrative:     Patient is from [facility] as a short term rehab patient.  Patient will need insurance authorization prior to discharge back to SNF.       Expected Discharge Plan and Services                                                 Social Determinants of Health (SDOH) Interventions    Readmission Risk Interventions     No data to display

## 2021-10-22 NOTE — Progress Notes (Addendum)
PROGRESS NOTE    Cole Mayer  TGG:[id number] DOB: 10-16-1935 DOA: 10/20/2021 PCP: Patient, No Pcp Per    Brief Narrative:   Cole Mayer is a 86 y.o. male with past medical history significant for advanced dementia, essential hypertension, CAD, hypothyroidism, hyperlipidemia, anxiety/depression who presented to [facility] ED on 9/30 via ambulance from [facility] with lethargy, difficulty swallowing over the last 2 days.  Patient with advanced dementia unable to participate in HPI.  Staff reported that no oral intake over the last 2 days.  At baseline minimally verbal.  History also obtained from daughter who reports recently discharged from [facility] 1 week ago.  While at a rehab, reported not eating and his daughter noticed that he was wincing when he was swallowing as if you are in pain.  No reported fevers or nausea/vomiting.  When his symptoms did not improve she requested the facility sent him to the ED for further evaluation.  In the ED, temperature 96.0 F, HR 74, RR 13, BP 141/79, SPO2 98% on room air.  WBC 21.1, hemoglobin 16.5, platelets 316.  Sodium 150, potassium 3.8, chloride 118, CO2 21, glucose 116, BUN 50, creatinine 1.82.  AST 30, ALT 34, total bilirubin 0.8.  High sensitive troponin 74> 63.  Lactic acid 2.6.  INR 1.3.  TSH 35.047.  COVID-19 PCR positive.  Influenza A/B PCR negative.  Urinalysis unrevealing.  CT head without contrast with no acute intracranial normalities, but notable chronic microvascular disease and brain atrophy.  CT soft tissue neck with pharyngitis/supraglottitis, no abscess or airway obstruction.  Chest x-ray with no acute cardiopulmonary disease process.  Blood cultures x2 obtained.  EDP discussed with ENT, Dr. Redmond Baseman who recommended medical admission with IV antibiotics, IV steroids.  TRH consulted for admission for further evaluation and treatment.  Assessment & Plan:   Acute metabolic encephalopathy Patient presenting to ED with from SNF with  lethargy, reduced oral intake in the setting of underlying advanced dementia.  Recently discharged from [facility].  Etiology likely multifactorial with active infection with pharyngitis/supraglottitis, COVID-19 viral infection, and possible underlying myxedema. --Supportive care, treatment as below  COVID-19 viral infection COVID-19 PCR positive.  Chest x-ray with no acute cardiopulmonary disease process.  Unable to tolerate oral intake. --Remdesivir Day #/3/3 --Continue monitoring of SPO2, maintain >92% --Continue airborne/contact isolation for 10 days from initial diagnosis on 9/30  Pharyngitis/supraglottitis WBC count elevated 21.1 on admission.  CT soft tissue neck with pharyngitis/supraglottitis with no abscess or airway obstruction.  EDP discussed with ENT on-call, Dr. Redmond Baseman who recommended medical admission with IV antibiotics, IV steroids. --WBC 21.1>14.0>21.5 (on high-dose steroids for possible myxedema) --Unasyn 3 g IV every 6 hours --Hydrocortisone 50 mg IV every 8 hours for possible myxedema coma as below --Supportive care --CBC daily  Oropharyngeal candidiasis --Fluconazole 50 mg IV every 24 hours  Hypothyroidism Possible myxedema Patient presenting with confusion from his typical baseline, lethargy.  TSH elevated at 35.  Unlikely taking his medications.  Patient was notably hypothermic leading to concern for possible myxedema. -- Cortisol level: 8.1 -- TSH 35.047>5.841 -- Free T4 0.99 -- Free T3: Pending -- Levothyroxine 100 mcg IV daily -- DC Liothyronine today -- Hydrocortisone 50 mg IV q8h  -- Bair hugger as needed --TSH, Free T3 and Free T4 daily  Acute renal failure on CKD stage IIIb Creatinine elevated 1.90 on admission.  Baseline of 1.24 on 10/10/2021.  Etiology likely secondary to severe dehydration in the setting of inability to tolerate oral  intake.  Renal ultrasounds with mild left hydronephrosis, 4 mm calculus inferior left kidney, fullness of the right  renal pelvis without frank hydronephrosis, slightly decreased size right kidney in comparison to ultrasound in 2021, limited exam due to overlying bowel gas. --Cr 1.90>1.72>1.56 --Continue IV fluid hydration w/ D5 1/2 NS at 146m/h --Avoid nephrotoxins, renal dose all medications --Repeat BMP in a.m.  Hypernatremia 2/2 severe dehydration Na 150 on admission, likely secondary to severe dehydration in the setting of poor oral intake complicated by dysphagia and pharyngitis/supraglottitis. -- Currently n.p.o. per SLP recommendations -- Continue IV fluid hydration -- May need to consider core track placement if no improvement upon reevaluation with speech therapy today to initiate tube feeds.  Ultimately poor prognosis given his comorbidities, awaiting palliative care evaluation as well.  Hypokalemia Potassium 3.3 this morning, likely secondary to poor oral intake.  IV potassium replacement today. -- Follow electrolytes daily.  Essential hypertension On amlodipine 2.5 mg p.o. daily at home.  BP 125/76, well controlled. --Continue to hold antihypertensives, unable to tolerate oral intake at this time --Hydralazine '10mg'$  IV q6h PRN SBP >165 or DBP >110  CAD --Holding home aspirin due to intolerance of oral intake  Hyperlipidemia On simvastatin 40 mg daily at home. -- Holding oral medications at this time  Dysphagia -- SLP following, currently recommend n.p.o.  Advanced dementia --Delirium precautions --Get up during the day --Encourage a familiar face to remain present throughout the day --Keep blinds open and lights on during daylight hours --Minimize the use of opioids/benzodiazepines --Holding home benazepril and Namenda due to inability to tolerate oral intake at this time   DVT prophylaxis: enoxaparin (LOVENOX) injection 40 mg Start: 10/22/21 2200    Code Status: DNR Family Communication: Updated daughter via telephone this morning.  Disposition Plan:  Level of care:  Stepdown Status is: Inpatient Remains inpatient appropriate because: Remains lethargic, unable to tolerate p.o. intake, concern for myxedema coma, remains hypothermic, IV steroids, IV antibiotics, overall prognosis guarded given comorbidities and advanced dementia.    Consultants:  Palliative care  Procedures:  Renal ultrasound  Antimicrobials:  Unasyn 9/30>> Fluconazole 9/30>> Remdesivir 9/30>>   Subjective: Patient seen and examined at bedside, resting comfortably.  Alert, pleasantly confused.  RN present.  No family present at bedside this morning, daughter JJanett Billowupdated by telephone this morning.  Appears slightly improved with increased alertness this morning and now off of bair hugger.  TSH much improved, free T4 now back within normal limits.  Will discontinue Liothyronine today.  Seen by speech therapy yesterday, remains n.p.o. due to his severe dysphagia.  Discussed with RN May need to initiate tube feeds if continues with inability to tolerate oral intake. Unable to obtain any further ROS from patient this morning due to his mental status and lethargy.  Overnight, patient with fall, head CT ordered that was unrevealing.  No other acute concerns overnight per nursing staff.  Objective: Vitals:   10/22/21 0400 10/22/21 0500 10/22/21 0600 10/22/21 0702  BP: 111/75 116/62 129/86 (!) 160/89  Pulse: 63 65 65 77  Resp: '16 18 18 15  '$ Temp: (!) 97.5 F (36.4 C) (!) 97.5 F (36.4 C) (!) 97.5 F (36.4 C) (!) 97.5 F (36.4 C)  TempSrc: Bladder     SpO2: 98% 100% 94% 96%  Weight:      Height:        Intake/Output Summary (Last 24 hours) at 10/22/2021 1127 Last data filed at 10/22/2021 0657 Gross per 24 hour  Intake 2277.58  ml  Output 300 ml  Net 1977.58 ml   Filed Weights   10/21/21 1848  Weight: 77.5 kg    Examination:  Physical Exam: GEN: NAD, alert, chronically ill in appearance, confused HEENT: NCAT, PERRL, EOMI, sclera clear, dry mucous membranes with white  patches to posterior pharynx and mild erythema/edema PULM: CTAB w/o wheezes/crackles, normal respiratory effort, on room air CV: RRR w/o M/G/R GI: abd soft, NTND, NABS, no R/G/M GU: Foley catheter in place MSK: no peripheral edema, moves all extremities independently Integumentary: dry/intact, no rashes or wounds    Data Reviewed: I have personally reviewed following labs and imaging studies  CBC: Recent Labs  Lab 10/20/21 1020 10/20/21 1030 10/21/21 0300 10/22/21 0333  WBC 21.1*  --  14.0* 21.5*  NEUTROABS 15.8*  --  12.3* 19.4*  HGB 16.5 16.0 13.6 13.9  HCT 51.1 47.0 41.3 43.2  MCV 91.3  --  90.0 91.5  PLT 316  --  258 993   Basic Metabolic Panel: Recent Labs  Lab 10/20/21 1020 10/20/21 1030 10/21/21 0300 10/22/21 0333  NA 150* 151* 150* 150*  K 3.8 3.7 3.9 3.3*  CL 118* 117* 119* 119*  CO2 21*  --  22 23  GLUCOSE 116* 124* 142* 148*  BUN 50* 49* 46* 44*  CREATININE 1.82* 1.90* 1.72* 1.56*  CALCIUM 8.8*  --  8.0* 7.8*  MG 2.1  --   --  1.9  PHOS  --   --   --  3.6   GFR: Estimated Creatinine Clearance: 38.6 mL/min (A) (by C-G formula based on SCr of 1.56 mg/dL (H)). Liver Function Tests: Recent Labs  Lab 10/20/21 1020 10/21/21 0300 10/22/21 0333  AST '30 21 26  '$ ALT 34 26 24  ALKPHOS 42 32* 29*  BILITOT 0.8 0.8 0.7  PROT 6.7 5.4* 5.4*  ALBUMIN 3.0* 2.5* 2.5*   No results for input(s): "LIPASE", "AMYLASE" in the last 168 hours. Recent Labs  Lab 10/20/21 1545  AMMONIA 14   Coagulation Profile: Recent Labs  Lab 10/21/21 0300  INR 1.3*   Cardiac Enzymes: No results for input(s): "CKTOTAL", "CKMB", "CKMBINDEX", "TROPONINI" in the last 168 hours. BNP (last 3 results) No results for input(s): "PROBNP" in the last 8760 hours. HbA1C: No results for input(s): "HGBA1C" in the last 72 hours. CBG: No results for input(s): "GLUCAP" in the last 168 hours. Lipid Profile: No results for input(s): "CHOL", "HDL", "LDLCALC", "TRIG", "CHOLHDL", "LDLDIRECT"  in the last 72 hours. Thyroid Function Tests: Recent Labs    10/22/21 0333  TSH 5.841*  FREET4 0.99   Anemia Panel: Recent Labs    10/21/21 0300 10/22/21 0333  FERRITIN 398* 363*   Sepsis Labs: Recent Labs  Lab 10/20/21 1338 10/21/21 0300 10/21/21 0655 10/22/21 0333  PROCALCITON  --  <0.10  --   --   LATICACIDVEN 2.6* 1.4 2.0* 2.3*    Recent Results (from the past 240 hour(s))  Resp Panel by RT-PCR (Flu A&B, Covid) Anterior Nasal Swab     Status: Abnormal   Collection Time: 10/20/21 10:38 AM   Specimen: Anterior Nasal Swab  Result Value Ref Range Status   SARS Coronavirus 2 by RT PCR POSITIVE (A) NEGATIVE Final    Comment: (NOTE) SARS-CoV-2 target nucleic acids are DETECTED.  The SARS-CoV-2 RNA is generally detectable in upper respiratory specimens during the acute phase of infection. Positive results are indicative of the presence of the identified virus, but do not rule out bacterial infection or co-infection with  other pathogens not detected by the test. Clinical correlation with patient history and other diagnostic information is necessary to determine patient infection status. The expected result is Negative.  Fact Sheet for Patients: EntrepreneurPulse.com.au  Fact Sheet for Healthcare Providers: IncredibleEmployment.be  This test is not yet approved or cleared by the [place] FDA and  has been authorized for detection and/or diagnosis of SARS-CoV-2 by FDA under an Emergency Use Authorization (EUA).  This EUA will remain in effect (meaning this test can be used) for the duration of  the COVID-19 declaration under Section 564(b)(1) of the A ct, 21 U.S.C. section 360bbb-3(b)(1), unless the authorization is terminated or revoked sooner.     Influenza A by PCR NEGATIVE NEGATIVE Final   Influenza B by PCR NEGATIVE NEGATIVE Final    Comment: (NOTE) The Xpert Xpress SARS-CoV-2/FLU/RSV plus assay is intended as an  aid in the diagnosis of influenza from Nasopharyngeal swab specimens and should not be used as a sole basis for treatment. Nasal washings and aspirates are unacceptable for Xpert Xpress SARS-CoV-2/FLU/RSV testing.  Fact Sheet for Patients: EntrepreneurPulse.com.au  Fact Sheet for Healthcare Providers: IncredibleEmployment.be  This test is not yet approved or cleared by the [place] FDA and has been authorized for detection and/or diagnosis of SARS-CoV-2 by FDA under an Emergency Use Authorization (EUA). This EUA will remain in effect (meaning this test can be used) for the duration of the COVID-19 declaration under Section 564(b)(1) of the Act, 21 U.S.C. section 360bbb-3(b)(1), unless the authorization is terminated or revoked.  Performed at [facility], [place] [address] [place], [place] [address]   Blood Culture (routine x 2)     Status: None (Preliminary result)   Collection Time: 10/20/21 12:55 PM   Specimen: BLOOD  Result Value Ref Range Status   Specimen Description   Final    BLOOD BLOOD LEFT HAND Performed at [facility] [address] [place], [place] [address]    Special Requests   Final    BOTTLES DRAWN AEROBIC ONLY Blood Culture results may not be optimal due to an inadequate volume of blood received in culture bottles Performed at [facility] [address], [place], [place] [address]    Culture   Final    NO GROWTH 2 DAYS Performed at [facility] [address] [place], [place] [address]    Report Status PENDING  Incomplete         Radiology Studies: CT HEAD WO CONTRAST (5MM)  Result Date: 10/21/2021 CLINICAL DATA:  Altered mental status, fall EXAM: CT HEAD WITHOUT CONTRAST TECHNIQUE: Contiguous axial images were obtained from the base of the skull through the vertex without intravenous contrast. RADIATION DOSE REDUCTION: This exam was performed  according to the departmental dose-optimization program which includes automated exposure control, adjustment of the mA and/or kV according to patient size and/or use of iterative reconstruction technique. COMPARISON:  10/20/2021 FINDINGS: Brain: No evidence of acute infarction, hemorrhage, mass, mass effect, or midline shift. No hydrocephalus or extra-axial fluid collection. Periventricular white matter changes, likely the sequela of chronic small vessel ischemic disease. Advanced cerebral atrophy for age with commensurate size of the ventricles and sulci. Vascular: No hyperdense vessel. Skull: Normal. Negative for fracture or focal lesion. Sinuses/Orbits: Minimal mucosal thickening in the ethmoid air cells. Status post bilateral lens replacements. Other: The mastoid air cells are well aerated. IMPRESSION: No acute intracranial process. Electronically Signed   By: Francetta Found.D.  On: 10/21/2021 21:30   US RENAL  Result Date: 10/20/2021 CLINICAL DATA:  Acute kidney injury EXAM: RENAL / URINARY TRACT ULTRASOUND COMPLETE COMPARISON:  10/01/2021 CT abdomen pelvis, 12/14/2019 renal ultrasound FINDINGS: Evaluation is somewhat limited due to overlying bowel gas. Right Kidney: Renal measurements: 8.0 x 4.0 x 3.1 cm = volume: 52 mL. On the prior ultrasound, the right kidney head a volume of 162 mL. Cortical thinning. Echogenicity within normal limits. No mass or hydronephrosis visualized, although the pelvis appears somewhat full. Left Kidney: Renal measurements: 12.1 x 5.8 x 4.4 cm = volume: 161 mL. Cortical thinning. Echogenicity within normal limits. Mild hydronephrosis. A shadowing calculus is noted in the inferior left kidney, which measures up to 4 mm. Bladder: Foley present.  The bladder is empty. Other: None. IMPRESSION: 1. Evaluation is somewhat limited by overlying bowel gas. Within this limitation, the right kidney appears to have decreased in size compared to the 12/14/2019 renal ultrasound. 2. Mild  left hydronephrosis. 3. 4 mm calculus in the inferior left kidney. 4. Fullness of the right renal pelvis without frank hydronephrosis. Electronically Signed   By: Merilyn Baba M.D.   On: 10/20/2021 23:18   CT Soft Tissue Neck W Contrast  Result Date: 10/20/2021 CLINICAL DATA:  Epiglottitis or tonsillitis suspected. Thrush. Sore throat EXAM: CT NECK WITH CONTRAST TECHNIQUE: Multidetector CT imaging of the neck was performed using the standard protocol following the bolus administration of intravenous contrast. RADIATION DOSE REDUCTION: This exam was performed according to the departmental dose-optimization program which includes automated exposure control, adjustment of the mA and/or kV according to patient size and/or use of iterative reconstruction technique. CONTRAST:  80m OMNIPAQUE IOHEXOL 300 MG/ML  SOLN COMPARISON:  None Available. FINDINGS: Pharynx and larynx: Prominent mucosal enhancement in the pharynx and supraglottic larynx with some secretions or debris layering in the pharynx. Mild thickening of the epiglottis and uvula. No abscess or retropharyngeal effusion. Salivary glands: No inflammation, mass, or stone. Thyroid: Very atrophic and low-density Lymph nodes: None enlarged or abnormal density. Vascular: Scattered atheromatous calcification. Limited intracranial: Partial coverage of brain atrophy. Visualized orbits: No acute finding Mastoids and visualized paranasal sinuses: Clear Skeleton: Generalized cervical spine degeneration. No acute or aggressive finding Upper chest: Clear apical lungs IMPRESSION: Pharyngitis and supraglottitis.  No abscess or airway obstruction. Electronically Signed   By: JJorje GuildM.D.   On: 10/20/2021 11:56        Scheduled Meds:  Chlorhexidine Gluconate Cloth  6 each Topical Daily   enoxaparin (LOVENOX) injection  40 mg Subcutaneous Q24H   hydrocortisone sod succinate (SOLU-CORTEF) inj  100 mg Intravenous Q8H   levothyroxine  100 mcg Intravenous Daily    Continuous Infusions:  ampicillin-sulbactam (UNASYN) IV Stopped (10/22/21 0814)   dextrose 5 % and 0.45% NaCl 150 mL/hr at 10/22/21 0701   fluconazole (DIFLUCAN) IV Stopped (10/21/21 1504)   potassium chloride 10 mEq (10/22/21 1112)     LOS: 2 days    Time spent: 56 minutes spent on chart review, discussion with nursing staff, consultants, updating family and interview/physical exam; more than 50% of that time was spent in counseling and/or coordination of care.    Linford Quintela J [place] DO [facility] Available via Epic secure chat 7am-7pm After these hours, please refer to coverage provider listed on [email or web address] 10/22/2021, 11:27 AM

## 2021-10-22 NOTE — Consult Note (Signed)
Consultation Note Date: 10/22/2021   Patient Name: Cole Mayer  DOB: August 28, 1935  MRN: [id number]  Age / Sex: 86 y.o., male   PCP: Patient, No Pcp Per Referring Physician: [place] Eric J, DO  Reason for Consultation: Complex Medical Decision Making/ [place]     Chief Complaint/History of Present Illness:  Patient is an 86yo M with a PMHx of advanced dementia, HTN, CAD, hypothyroidism, HTN, CAD, HLD, and anxiety/depression who was admitted on 10/20/21 from [facility] from management of lethargy and difficulty swallowing over the 2 days prior to arrival. Patient's family and EMR review also stated patient had recently been discharged from [facility] for rehab. 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 Palliative care consulted to assist with complex medical decision making.   Presented to bedside. Patient laying in bed in mittens; randomly lifting arms. Patient is mumbling incoherently and not following commands.  Discussed updates in care with bedside RN.  Able to call patient's wife, Cole Mayer. Introduced myself as a member of the palliative care team. Inquired about patient's recent history. Mrs. Fortin described that patient has been through a lot the past few months. Prior to multiple TOC, patient had been at home. He had been able to walk, interact, and completed most ADLS, IADLS needed assistance. Patient then required hospitalization at [facility] and he was there for ~1 month. Patient's status deteriorated during this time with decrease po intake and minimal OOB activities as per wife's report. Patient was then at [facility] for further rehab before being sent to hospital. Overall, patient has continued to not  eat well. She noted patient had been working with PT though and last Thursday patient had walked on his own some before deteriorating and needing to come to hospital. Mrs. Marone noted that plan after rehab was for patient to go to memory care unit for long-term care as he could not safely return home with his care needs.  Mrs. Eppinger able to discuss that her mother and patient's mother had Alzheimer's so she and the patient knew things were going to get worse.  With permission, able to update Mrs. Cole Mayer regarding patient's current status. Also informed her about patient's fall OOB overnight. Informed CT imaging did not show acute bleeding. Mrs. Cole Mayer voiced appreciation for review of medical information.  When enquiring about medical decision making, Mrs. Whichard noted that she and the patient's daughter, Cole Mayer, is patient's legal guardian. Reported they have completed this paperwork through a lawyer. She noted that Cole Mayer discusses medical care with providers and then updates her about it to discuss medical decisions moving forward. Thanked Mrs. Holquin for all this information. All questions answered at that time. Noted would call daughter after this discussion.   Then able to call patient's daughter/reported guardian, Cole Mayer. Introduced myself as a member of the palliative care team. Cole Mayer confirmed that she is patient's Field seismologist and should be the one called with updates. Cole Mayer had recently talked to hospitalist and so was able to summarize what she had heard in terms of care moving forward. Cole Mayer then needed to excuse herself at that time for an appointment, noted this provider would call back shortly.   Able to follow-up with daughter, Cole Mayer, approximately 30 minutes later.  Cole Mayer was also able to further describe in detail all  patient has been through since August.  She noted that patient had worsening paranoia later in August and was noted to think people were  trying to kill him, he was hitting family, and was even threatening to kill his family.  Patient was admitted to the hospital on August 25 and during this hospitalization psychiatry saw the patient and started him on Zyprexa which did seem to help with his mentation.  Cole Mayer described that patient has gone from 193 pounds to 181 pounds in a month due to his decreased oral intake.  She noted that when he went to [facility] for rehab a week and a half ago he fell within an hour of being there and so had another ER visit without noted injuries and so was sent back to rehab.  She noted that at rehab they had a paid caregiver present 24/7 to assist with feeding and encouraging intake.  She described that before patient had this deterioration in August, though he had a shuffling gait he did not use any assistive devices and worked out with little weights regularly.  The consistency has been that patient's oral intake has decreased over the past couple of months.  We discussed the progression of dementia and how all when patient has so many medical illnesses that compile, it can set back the ability to heal due to normal aging process.  This is especially true for people with underlying dementia.  Discussed progression of dementia as the disease. The current noted plan at this time was to place core track with hope that providing patient with some nutrition would improve his overall status.  Noted that patient would never want long-term tube feeds as he saw his mother go through this with her dementia and found it to be torturous.  Acknowledged attempt at tube feeds and also expressed concern that patient is very agitated and despite mittens, patients can pull out feeding tubes.  Expressed concern that if patient removes feeding tube on his own, we should not replace that as that is him in his body saying that is not acceptable care to him.  Cole Mayer very much agreeing with plan that if patient takes out feeding tube it  should not be replaced.  Cole Mayer did express concerns regarding "starving to death" and so we spent time exploring the normal progression of dementia and when bodies are starting to shut down the normal loss of desire to eat.  Sick also expressed concerns about patient not receiving antipsychotic, Zyprexa, as that was the only medication that had helped with patient's paranoia.  Discussed the patient is currently n.p.o. and so cannot give p.o. Zyprexa.  The alternative form for Zyprexa is IM which could also cause agitation in patients and needs repeat IM injections.  Cole Mayer noted that would not be good quality of life and would cause the patient pain.  Very much agreed with this.  Noted would inform hospitalist of concern regarding antipsychotics as may need to find alternatives.  Discussed with Cole Mayer continuing regular monitoring of patient at this time to determine how he is overall going to do especially in light of his pharyngitis and thrush.  Should patient want to eat moving forward and has the mentation to do so, would want to support this.  Also expressed concerns that patient may be reaching the end of his life and is showing signs of this by no longer wanting to eat since that has been occurring for over a month.  Cole Mayer acknowledged this as well.  Noted palliative care team will continue to follow along to have discussions regarding pathways for possible care moving forward.  Cole Mayer noted appreciation for conversation today.  Ernestene Kiel for allowing palliative care team provider to discuss patient's care with her.  Primary Diagnoses  Present on Admission:  Pharyngitis  Essential hypertension, benign  Elevated troponin  Dementia (HCC)  CAD, NATIVE VESSEL  Mixed hyperlipidemia   Palliative Review of Systems: Unable to obtain ROS from patient due to underlying medical illness. He appears agitated and requires mittens for safety.   I have reviewed the medical record, interviewed  the patient and family, and examined the patient. The following aspects are pertinent.  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  Tobacco Use   Smoking status: Former    Packs/day: 0.80    Years: 30.00    Total pack years: 24.00    Types: Cigarettes    Start date: 09/08/1957    Quit date: 01/05/1988    Years since quitting: 33.8   Smokeless tobacco: Former    Types: Chew   Tobacco comments:    chewed a little  Vaping Use   Vaping Use: Never used  Substance and Sexual Activity   Alcohol use: Yes    Comment: Occasional   Drug use: No   Sexual activity: Not on file  Other Topics Concern   Not on file  Social History Narrative   Not on file   Social Determinants of Health   Financial Resource Strain: Not on file  Food Insecurity: Not on file  Transportation Needs: Not on file  Physical Activity: Not on file  Stress: Not on file  Social Connections: Not on file   Family History  Problem Relation Age of Onset   Coronary artery disease Other    Thyroid disease Father    Hypertension Father    Scheduled Meds:  Chlorhexidine Gluconate Cloth  6 each Topical Daily   enoxaparin (LOVENOX) injection  40 mg Subcutaneous Q24H   hydrocortisone sod succinate (SOLU-CORTEF) inj  100 mg Intravenous Q8H   levothyroxine  100 mcg Intravenous Daily   Continuous Infusions:  ampicillin-sulbactam (UNASYN) IV Stopped (10/22/21 0814)   dextrose 5 % and  0.45% NaCl 150 mL/hr at 10/22/21 0701   fluconazole (DIFLUCAN) IV Stopped (10/21/21 1504)   potassium chloride 10 mEq (10/22/21 1112)   PRN Meds:.acetaminophen **OR** acetaminophen, Ipratropium-Albuterol, ondansetron **OR** ondansetron (ZOFRAN) IV Medications Prior to Admission:  Prior to Admission medications   Medication Sig Start Date End Date Taking? Authorizing Provider  amLODipine (NORVASC) 2.5 MG tablet Take 2.5 mg by mouth See admin instructions. "Take 2.5 mg by mouth once a day with 4-8 ounces of water"   Yes [provider]  aspirin EC 81 MG tablet Take 81 mg by mouth daily.   Yes [provider]  donepezil (ARICEPT) 10  MG tablet Take 10 mg by mouth daily.   Yes [provider]  levothyroxine (SYNTHROID) 112 MCG tablet Take 112 mcg by mouth daily before breakfast.   Yes [provider]  melatonin 3 MG TABS tablet Take 3 mg by mouth at bedtime as needed (sleep).   Yes [provider]  memantine (NAMENDA) 10 MG tablet Take 10 mg by mouth daily.   Yes [provider]  QUEtiapine (SEROQUEL) 25 MG tablet Take 25 mg by mouth at bedtime as needed ("for paranoia, agitation, or insomnia"). 10/15/21 10/29/21 Yes [provider]  sertraline (ZOLOFT) 25 MG tablet Take 25 mg by mouth daily.   Yes [provider]  simvastatin (ZOCOR) 40 MG tablet Take 40 mg by mouth at bedtime.   Yes [provider]  nitroGLYCERIN (NITROSTAT) 0.4 MG SL tablet Place 1 tablet (0.4 mg total) under the tongue every 5 (five) minutes x 3 doses as needed for chest pain (if no relief after 2nd dose, proceed to ED or call 911). Up to 3 doses. Patient not taking: Reported on 10/10/2021 04/24/21   Satira Sark, MD  polyethylene glycol (MIRALAX / GLYCOLAX) 17 g packet Take 17 g by mouth daily as needed for mild constipation. Patient not taking: Reported on 10/10/2021 07/19/21   Hosie Poisson, MD   No Known Allergies CBC:    Component Value  Date/Time   WBC 21.5 (H) 10/22/2021 0333   HGB 13.9 10/22/2021 0333   HCT 43.2 10/22/2021 0333   PLT 272 10/22/2021 0333   MCV 91.5 10/22/2021 0333   NEUTROABS 19.4 (H) 10/22/2021 0333   LYMPHSABS 0.7 10/22/2021 0333   MONOABS 1.0 10/22/2021 0333   EOSABS 0.0 10/22/2021 0333   BASOSABS 0.1 10/22/2021 0333   Comprehensive Metabolic Panel:    Component Value Date/Time   NA 150 (H) 10/22/2021 0333   K 3.3 (L) 10/22/2021 0333   CL 119 (H) 10/22/2021 0333   CO2 23 10/22/2021 0333   BUN 44 (H) 10/22/2021 0333   CREATININE 1.56 (H) 10/22/2021 0333   GLUCOSE 148 (H) 10/22/2021 0333   CALCIUM 7.8 (L) 10/22/2021 0333   AST 26 10/22/2021 0333   ALT 24 10/22/2021 0333   ALKPHOS 29 (L) 10/22/2021 0333   BILITOT 0.7 10/22/2021 0333   PROT 5.4 (L) 10/22/2021 0333   ALBUMIN 2.5 (L) 10/22/2021 0333    Physical Exam: Vital Signs: BP (!) 160/89   Pulse 77   Temp (!) 97.5 F (36.4 C)   Resp 15   Ht 6' 0.01" (1.829 m)   Wt 77.5 kg   SpO2 96%   BMI 23.17 kg/m  SpO2: SpO2: 96 % O2 Device: O2 Device: Room Air O2 Flow Rate:   Intake/output summary:  Intake/Output Summary (Last 24 hours) at 10/22/2021 1126 Last data filed at 10/22/2021 7353 Gross per 24 hour  Intake 2277.58 ml  Output 300 ml  Net 1977.58 ml   LBM: Last BM Date :  (PTA) Baseline Weight: Weight: 77.5 kg Most recent weight: Weight: 77.5 kg  General: laying in bed, appears agitated, bed mats on floor, in mittens Eyes: conjunctiva clear, anicteric sclera HENT: normocephalic, atraumatic, dry mucous membranes Cardiovascular: RRR Respiratory: no increase work of breathing noted, not in respiratory distress Abdomen: not distended Extremities: no edema in LE b/l Skin: no rashes or lesions on visible skin Neuro: agitated, not following commands appropriately  Psych: unintelligibly mumbling to himself  Palliative Performance Scale: 20%  Additional Data Reviewed: Recent Labs    10/21/21 0300 10/22/21 0333   WBC 14.0* 21.5*  HGB 13.6 13.9  PLT 258 272  NA 150* 150*  BUN 46* 44*  CREATININE 1.72* 1.56*    Imaging:  Personally reviewed recent imaging including Ct head, CT neck, and chest XR.   Palliative Care Assessment and Plan Summary of Established Goals of Care and Medical Treatment Preferences    # Complex medical decision making/goals of care  -Unable to obtain from patient secondary to medical status.  -As of conversation with patient's family including his wife and daughter.  Of note patient's wife and daughter report that the daughter, Cole Mayer, is patient's legal guardian and therefore decision maker.  Also because should be called regarding medical care though she informs her mother regarding changes in medical status.  -At this time continuing with current level of medical care to determine if patient able to improve overall.  Did discuss concerns about ability to bounce back after multiple medical illnesses and especially in setting of underlying dementia.   -Cole Mayer shared that patient would not want long-term tube feeding.  Cole Mayer had already agreed to placement of core track to see if nutrition would help to "improve" patient's status.  We did discuss that should patient remove the core track himself, would not replace as that is patient's body showing what he will and will not accept.  Cole Mayer very much agreed that replacement of core track would just cause discomfort and suffering and should not be done.  -Continue to evaluate daily patient's progress to determine overall pathway for medical care moving forward.  Should patient acutely decompensate during hospitalization, please inform daughter so that she could present to hospital if needed.  -  Code Status: DNR   # Discharge Planning: TBD  This provider spent a total of 87 minutes providing patient's care.  Includes review of EMR, discussing care with other staff members involved in patient's medical care, obtaining relevant  history and information from patient's family, and personal review of imaging and lab work. Greater than 50% of the time was spent counseling and coordinating care related to the above assessment and plan.    Thank you for allowing palliative care team to be involved in patient's care.  We will continue to follow along.    Signed by: Chelsea Aus, DO Palliative Care Provider [phone]

## 2021-10-23 DIAGNOSIS — U071 COVID-19: Secondary | ICD-10-CM | POA: Diagnosis not present

## 2021-10-23 DIAGNOSIS — I2510 Atherosclerotic heart disease of native coronary artery without angina pectoris: Secondary | ICD-10-CM | POA: Diagnosis not present

## 2021-10-23 DIAGNOSIS — N179 Acute kidney failure, unspecified: Secondary | ICD-10-CM | POA: Diagnosis not present

## 2021-10-23 DIAGNOSIS — J029 Acute pharyngitis, unspecified: Secondary | ICD-10-CM | POA: Diagnosis not present

## 2021-10-23 DIAGNOSIS — Z515 Encounter for palliative care: Secondary | ICD-10-CM

## 2021-10-23 LAB — CBC WITH DIFFERENTIAL/PLATELET
Abs Immature Granulocytes: 0.35 10*3/uL — ABNORMAL HIGH (ref 0.00–0.07)
Basophils Absolute: 0.1 10*3/uL (ref 0.0–0.1)
Basophils Relative: 0 %
Eosinophils Absolute: 0 10*3/uL (ref 0.0–0.5)
Eosinophils Relative: 0 %
HCT: 40.9 % (ref 39.0–52.0)
Hemoglobin: 13.5 g/dL (ref 13.0–17.0)
Immature Granulocytes: 2 %
Lymphocytes Relative: 3 %
Lymphs Abs: 0.7 10*3/uL (ref 0.7–4.0)
MCH: 29.7 pg (ref 26.0–34.0)
MCHC: 33 g/dL (ref 30.0–36.0)
MCV: 90.1 fL (ref 80.0–100.0)
Monocytes Absolute: 1.1 10*3/uL — ABNORMAL HIGH (ref 0.1–1.0)
Monocytes Relative: 5 %
Neutro Abs: 20.8 10*3/uL — ABNORMAL HIGH (ref 1.7–7.7)
Neutrophils Relative %: 90 %
Platelets: 251 10*3/uL (ref 150–400)
RBC: 4.54 MIL/uL (ref 4.22–5.81)
RDW: 13.8 % (ref 11.5–15.5)
WBC: 23 10*3/uL — ABNORMAL HIGH (ref 4.0–10.5)
nRBC: 0 % (ref 0.0–0.2)

## 2021-10-23 LAB — COMPREHENSIVE METABOLIC PANEL
ALT: 24 U/L (ref 0–44)
AST: 24 U/L (ref 15–41)
Albumin: 2.2 g/dL — ABNORMAL LOW (ref 3.5–5.0)
Alkaline Phosphatase: 29 U/L — ABNORMAL LOW (ref 38–126)
Anion gap: 7 (ref 5–15)
BUN: 33 mg/dL — ABNORMAL HIGH (ref 8–23)
CO2: 22 mmol/L (ref 22–32)
Calcium: 7.3 mg/dL — ABNORMAL LOW (ref 8.9–10.3)
Chloride: 119 mmol/L — ABNORMAL HIGH (ref 98–111)
Creatinine, Ser: 1.33 mg/dL — ABNORMAL HIGH (ref 0.61–1.24)
GFR, Estimated: 53 mL/min — ABNORMAL LOW (ref >60)
Glucose, Bld: 135 mg/dL — ABNORMAL HIGH (ref 70–99)
Potassium: 3 mmol/L — ABNORMAL LOW (ref 3.5–5.1)
Sodium: 148 mmol/L — ABNORMAL HIGH (ref 135–145)
Total Bilirubin: 0.7 mg/dL (ref 0.3–1.2)
Total Protein: 4.7 g/dL — ABNORMAL LOW (ref 6.5–8.1)

## 2021-10-23 LAB — MAGNESIUM: Magnesium: 1.6 mg/dL — ABNORMAL LOW (ref 1.7–2.4)

## 2021-10-23 LAB — C-REACTIVE PROTEIN: CRP: 3.9 mg/dL — ABNORMAL HIGH (ref <1.0)

## 2021-10-23 LAB — T3, FREE: T3, Free: 1.1 pg/mL — ABNORMAL LOW (ref 2.0–4.4)

## 2021-10-23 LAB — TSH: TSH: 7.621 u[IU]/mL — ABNORMAL HIGH (ref 0.350–4.500)

## 2021-10-23 LAB — D-DIMER, QUANTITATIVE: D-Dimer, Quant: 1.48 ug/mL-FEU — ABNORMAL HIGH (ref 0.00–0.50)

## 2021-10-23 LAB — T4, FREE: Free T4: 1.1 ng/dL (ref 0.61–1.12)

## 2021-10-23 MED ORDER — ADULT MULTIVITAMIN W/MINERALS CH: 1.0000 | ORAL_TABLET | Freq: Every day | ORAL | Status: DC

## 2021-10-23 MED ORDER — FOLIC ACID 1 MG PO TABS: 1.0000 mg | ORAL_TABLET | Freq: Every day | ORAL | Status: DC

## 2021-10-23 MED ORDER — THIAMINE MONONITRATE 100 MG PO TABS: 100.0000 mg | ORAL_TABLET | Freq: Every day | ORAL | Status: DC

## 2021-10-23 MED ORDER — FREE WATER: 150.0000 mL | Status: DC

## 2021-10-23 MED ORDER — JEVITY 1.5 CAL/FIBER PO LIQD
1000.0000 mL | ORAL | Status: DC
  Filled 2021-10-23 (×6): qty 1000

## 2021-10-23 MED ORDER — MAGNESIUM SULFATE 4 GM/100ML IV SOLN
4.0000 g | Freq: Once | INTRAVENOUS | Status: AC
  Administered 2021-10-23: 4 g via INTRAVENOUS
  Filled 2021-10-23: qty 100

## 2021-10-23 MED ORDER — POTASSIUM CHLORIDE 10 MEQ/100ML IV SOLN
10.0000 meq | INTRAVENOUS | Status: AC
  Administered 2021-10-23 (×6): 10 meq via INTRAVENOUS
  Filled 2021-10-23: qty 200
  Filled 2021-10-23 (×4): qty 100

## 2021-10-23 MED ORDER — PROSOURCE TF20 ENFIT COMPATIBL EN LIQD
60.0000 mL | Freq: Every day | ENTERAL | Status: DC
  Filled 2021-10-23 (×3): qty 60

## 2021-10-23 MED ORDER — DEXTROSE-NACL 5-0.45 % IV SOLN: INTRAVENOUS | Status: DC

## 2021-10-23 NOTE — Progress Notes (Signed)
Speech Language Pathology Treatment: Dysphagia  Patient Details Name: Cole Mayer MRN: [id number] DOB: Jun 07, 1935 Today's Date: 10/23/2021 Time: 7096-2836 SLP Time Calculation (min) (ACUTE ONLY): 23 min  Assessment / Plan / Recommendation Clinical Impression  Pt seen for skilled SLP to help in determining readiness for po intake.  Buddy was alert and friendly, disoriented with language of confusion due to his dementia and he did not follow directions.  He did accept po trials including single ice chip x1, tsps thin juice and thin water, 1/4 bite of [place] ice, juice mixed with gingerale thin and nectar thick juice.  He demonstrates oral holding with delay in swallow and anterior spillage.  Larger than tsp amounts results in post-swallow coughing and wet voice.  At this time, recommend pt be allowed single ice chips after oral care.  Given his recent hospital admit, progression weight loss and progressive cognitive disorder - consideration for comfort feeds may be beneficial.  Pt will likely only consume tsp amounts of intake = as today he accepted very small amounts of each various po offered and declined further intake. Note ENT recommendations and medical treatment included ABX and steroid to tx pharyngitis - ? if tx may improve his intake minimally.     Pt breathing sounded congested ? in  mid chest - causing SLP to question potential secretion aspiration that will not be prevented with feeding tube.  Note ENT recommendations and medical treatment included ABX and steroid - to help maxmize swallow rehab.  SLP has concerned that any type of alternative means of nutrition will carry more burden than any benefits.  No family present to educate - but not palliative if following up.    HPI HPI: Pt is an 86 y.o. male who presented with odynophagia. Pt was recently discharged to rehab to prep him for transition to memory care approximately one week prior to admission. While at rehab, pt was not eating  and his daughter reportedly noticed wincing when swallowing and a hoarse voice. CT soft tissue: Prominent mucosal enhancement in the pharynx and  supraglottic larynx with some secretions or debris layering in the pharynx. Mild thickening of the epiglottis and uvula; Dx pharyngitis and supraglottitis. Pt found to have COVID-19 PMH: dementia, HTN, CAD, hypothyroidism, HLD.      SLP Plan  Continue with current plan of care      Recommendations for follow up therapy are one component of a multi-disciplinary discharge planning process, led by the attending physician.  Recommendations may be updated based on patient status, additional functional criteria and insurance authorization.    Recommendations  Diet recommendations: NPO (single ice chips) Liquids provided via: Cup;Teaspoon Medication Administration: Via alternative means Supervision: Trained caregiver to feed patient;Full supervision/cueing for compensatory strategies Compensations: Slow rate;Small sips/bites;Minimize environmental distractions Postural Changes and/or Swallow Maneuvers: Seated upright 90 degrees                Oral Care Recommendations: Oral care QID;Oral care prior to ice chip/H20;Staff/trained caregiver to provide oral care Follow Up Recommendations: Other (comment) (TBD) Assistance recommended at discharge: Frequent or constant Supervision/Assistance SLP Visit Diagnosis: Dysphagia, unspecified (R13.10) Plan: Continue with current plan of care         Cole Lime, MS [place] [phone] Pager [phone]   Cole Mayer  10/23/2021, 11:13 AM

## 2021-10-23 NOTE — Care Management Important Message (Signed)
Important Message  Patient Details IM Letter given. Name: Cole Mayer MRN: [id number] Date of Birth: 10-Sep-1935   Medicare Important Message Given:  Yes     Kerin Salen 10/23/2021, 11:44 AM

## 2021-10-23 NOTE — Progress Notes (Addendum)
Initial Nutrition Assessment  DOCUMENTATION CODES:   Not applicable  INTERVENTION:  Once NGT placement is confirmed:  -Provide '100mg'$  thiamine, and '1mg'$  folic acid x 7 days, and daily mvi -Initiate Jevity 1.5 @ 79m/hr w/ 638mProsource q day.  -As tolerated increase EN by 1060m 24hrs to goal rate of 72m56m x 24hrs (1320mL62mal volume) w/ Prosource daily to provide 2060kcal (26.6kcal/kg), 104g protein (1.34g/kg) and 1003mL 70m water -FWF:150mL q52ms to provide an additional 900mL fr34mater  Please check lytes q 12hrs until labs stable and EN at goal rate.  NUTRITION DIAGNOSIS:  Inadequate oral intake related to dysphagia as evidenced by NPO status (x 5 days).  GOAL:  Patient will meet greater than or equal to 90% of their needs  MONITOR:  TF tolerance  REASON FOR ASSESSMENT:  Consult Enteral/tube feeding initiation and management  ASSESSMENT:  Pt is an 86yo M w32yoPMH of dementia, HLD, MI, hypothyroidism, HTN, and CAD who presents with lethargy and difficulty swallowing found to be covid positive.  Pt remains NPO per SLP recommendations. Plan for pt to have NGT placed and EN initiated.   Spoke with RN about pt's mental status. She reports pt becomes easily agitated/disoriented. Has mitts in place at this time. RN reports pt with multiple skin tears. Pt unable to provide nutrition related history due to dementia. NFPE deferred at this time to prevent disorientation. Chart review shows a significant 15.7% unintended weight loss in the last 1 month. Reported no po intake x 5 days due to painful/difficult swallow. Pt likely with malnutrition, but unable to complete physical exam to confirm.   When NGT in place and confirmed with xray, recommend initiating '100mg'$  thiamine and '1mg'$  folic acid x 5-7 days, and daily mvi. Please check electrolytes q 12hrs until lytes are stable and EN at goal rate.  Recommend initiating Jevity 1.5 at 20mL/hr 33m0mL Pros66me daily. As tolerated  increase EN by 10mL q 24h46mo goal rate of 72mL/hr x 24m (1320mL total v38me) w/ Prosource daily to provide 2060kcal, 104g protein, and 1003mL free wat8mRecommend 150mL FWF q 4hr64m provide an additional 900mL free water72m or total of 1903mL free water.37medications reviewed and include: solu-cortef, synthroid, diflucan, prn haldol  Labs reviewed: Na:148, K:3.0, BG:135, BUN:33, Cr:1.33, Mg:1.6, GFR:53, CRP:3.9   NUTRITION - FOCUSED PHYSICAL EXAM: Deferred per RN  Diet Order:   Diet Order             Diet NPO time specified Except for: Ice Chips, Sips with Meds, Other (See Comments)  Diet effective now                   EDUCATION NEEDS:  Not appropriate for education at this time  Skin:  Skin Assessment: Skin Integrity Issues: Skin Integrity Issues:: Stage I, Other (Comment) Stage I: on buttock Other: skin tear on leg  Last BM:  10/3  Height:  Ht Readings from Last 1 Encounters:  10/21/21 6' 0.01" (1.829 m)   Weight:  Wt Readings from Last 1 Encounters:  10/21/21 77.5 kg   BMI:  Body mass index is 23.17 kg/m.  Estimated Nutritional Needs:   Kcal:  1940-2325kcal (25-30kcal/kg)  Protein:  90-115g (1.2-1.5g/kg)  Fluid:  >1940mL  Katie Busac58mMS,Candise BowensC See AMiON for contact information

## 2021-10-23 NOTE — Progress Notes (Signed)
PROGRESS NOTE    Cole Mayer  UVO:[id number] DOB: 23-May-1935 DOA: 10/20/2021 PCP: Patient, No Pcp Per    Brief Narrative:   Cole Mayer is a 86 y.o. male with past medical history significant for advanced dementia, essential hypertension, CAD, hypothyroidism, hyperlipidemia, anxiety/depression who presented to [facility] ED on 9/30 via ambulance from [facility] with lethargy, difficulty swallowing over the last 2 days.  Patient with advanced dementia unable to participate in HPI.  Staff reported that no oral intake over the last 2 days.  At baseline minimally verbal.  History also obtained from daughter who reports recently discharged from [facility] 1 week ago.  While at a rehab, reported not eating and his daughter noticed that he was wincing when he was swallowing as if you are in pain.  No reported fevers or nausea/vomiting.  When his symptoms did not improve she requested the facility sent him to the ED for further evaluation.  In the ED, temperature 96.0 F, HR 74, RR 13, BP 141/79, SPO2 98% on room air.  WBC 21.1, hemoglobin 16.5, platelets 316.  Sodium 150, potassium 3.8, chloride 118, CO2 21, glucose 116, BUN 50, creatinine 1.82.  AST 30, ALT 34, total bilirubin 0.8.  High sensitive troponin 74> 63.  Lactic acid 2.6.  INR 1.3.  TSH 35.047.  COVID-19 PCR positive.  Influenza A/B PCR negative.  Urinalysis unrevealing.  CT head without contrast with no acute intracranial normalities, but notable chronic microvascular disease and brain atrophy.  CT soft tissue neck with pharyngitis/supraglottitis, no abscess or airway obstruction.  Chest x-ray with no acute cardiopulmonary disease process.  Blood cultures x2 obtained.  EDP discussed with ENT, Dr. Redmond Baseman who recommended medical admission with IV antibiotics, IV steroids.  TRH consulted for admission for further evaluation and treatment.  Assessment & Plan:   Acute metabolic encephalopathy Patient presenting to ED with from SNF with  lethargy, reduced oral intake in the setting of underlying advanced dementia.  Recently discharged from [facility].  Etiology likely multifactorial with active infection with pharyngitis/supraglottitis, COVID-19 viral infection, and possible underlying myxedema. --Supportive care, treatment as below  COVID-19 viral infection COVID-19 PCR positive.  Chest x-ray with no acute cardiopulmonary disease process.  Unable to tolerate oral intake and completed 3-day course of remdesivir --Continue monitoring of SPO2, maintain >92% --Continue airborne/contact isolation for 10 days from initial diagnosis on 9/30  Pharyngitis/supraglottitis WBC count elevated 21.1 on admission.  CT soft tissue neck with pharyngitis/supraglottitis with no abscess or airway obstruction.  EDP discussed with ENT on-call, Dr. Redmond Baseman who recommended medical admission with IV antibiotics, IV steroids. --WBC 21.1>14.0>21.5>23.0 (on high-dose steroids for possible myxedema) --Unasyn 3 g IV every 6 hours --Hydrocortisone 50 mg IV every 8 hours for possible myxedema coma as below --Supportive care --CBC daily  Oropharyngeal candidiasis --Fluconazole 50 mg IV every 24 hours  Hypothyroidism Possible myxedema Patient presenting with confusion from his typical baseline, lethargy.  TSH elevated at 35.  Unlikely taking his medications.  Patient was notably hypothermic leading to concern for possible myxedema. -- Cortisol level: 8.1 -- TSH 35.047>5.841>7.621 -- Free T4 0.99>1.10 -- Free T3: Pending -- Levothyroxine 100 mcg IV daily -- DC Liothyronine 10/2 -- Hydrocortisone 50 mg IV q8h  -- Bair hugger now discontinued as normothermic --TSH, Free T4 daily  Acute renal failure on CKD stage IIIb Creatinine elevated 1.90 on admission.  Baseline of 1.24 on 10/10/2021.  Etiology likely secondary to severe dehydration in the setting of inability to  tolerate oral intake.  Renal ultrasounds with mild left hydronephrosis, 4 mm calculus  inferior left kidney, fullness of the right renal pelvis without frank hydronephrosis, slightly decreased size right kidney in comparison to ultrasound in 2021, limited exam due to overlying bowel gas. --Cr 1.90>1.72>1.56>1.33 --Continue IV fluid hydration w/ D5 1/2 NS at 173m/h --Avoid nephrotoxins, renal dose all medications --Repeat BMP in a.m.  Hypernatremia 2/2 severe dehydration Na 150 on admission, likely secondary to severe dehydration in the setting of poor oral intake complicated by dysphagia and pharyngitis/supraglottitis. -- Currently n.p.o. per SLP recommendations -- Continue IV fluid hydration -- Placing feeding tube to initiate tube feeds today  Hypokalemia Potassium 3.0 this morning, likely secondary to poor oral intake.  IV potassium replacement today.  Feeding tube placement as above. -- Follow electrolytes daily.  Essential hypertension On amlodipine 2.5 mg p.o. daily at home.  BP 125/76, well controlled. --Continue to hold antihypertensives, unable to tolerate oral intake at this time --Hydralazine '10mg'$  IV q6h PRN SBP >165 or DBP >110  CAD --Holding home aspirin due to intolerance of oral intake  Hyperlipidemia On simvastatin 40 mg daily at home. -- Holding oral medications at this time  Dysphagia -- SLP following, currently recommend n.p.o. -- Order for smallbore feeding tube placed, nutrition to initiate tube feeds thereafter  Advanced dementia --Delirium precautions --Get up during the day --Encourage a familiar face to remain present throughout the day --Keep blinds open and lights on during daylight hours --Minimize the use of opioids/benzodiazepines --Holding home benazepril and Namenda due to inability to tolerate oral intake at this time --Haldol PRN   DVT prophylaxis: enoxaparin (LOVENOX) injection 40 mg Start: 10/22/21 2200    Code Status: DNR Family Communication: Updated daughter via telephone this morning.  Disposition Plan:  Level of  care: Telemetry Status is: Inpatient Remains inpatient appropriate because: Remains lethargic, unable to tolerate p.o. intake, IV steroids, IV antibiotics, feeding tube order to initiate tube feeds, overall prognosis guarded given comorbidities and advanced dementia.    Consultants:  Palliative care  Procedures:  Renal ultrasound  Antimicrobials:  Unasyn 9/30>> Fluconazole 9/30>> Remdesivir 9/30>>   Subjective: Patient seen and examined at bedside, resting comfortably.  Alert, pleasantly confused.  No family present at bedside this morning, daughter JJanett Billowupdated by telephone this morning.  Remains off of Bair hugger now that normothermic.  Seen by speech therapy, remains n.p.o. due to his severe dysphagia.  Daughter would like to initiate tube feeds with feeding tube for now but understands that he may not tolerate placement or he may pull out feeding tube.  She just would like to attempt this for a few days to see if there is any improvement in his overall status because apparently patient was working well with physical therapy outpatient just on Thursday.  RN concerned about patient being on telemetry and receiving IV K runs.  No other acute concerns overnight per nursing staff.  Objective: Vitals:   10/22/21 1600 10/22/21 1800 10/22/21 2052 10/23/21 0541  BP: (!) 129/58 133/89 (!) 147/71 (!) 129/90  Pulse: 60 60 60 63  Resp: '16 17 16 18  '$ Temp: (!) 97.3 F (36.3 C) 97.7 F (36.5 C) (!) 97.4 F (36.3 C) 97.7 F (36.5 C)  TempSrc:   Oral Oral  SpO2: 96% 93% 96%   Weight:      Height:        Intake/Output Summary (Last 24 hours) at 10/23/2021 1105 Last data filed at 10/23/2021 0600 Gross per 24  hour  Intake 3544.64 ml  Output 1150 ml  Net 2394.64 ml   Filed Weights   10/21/21 1848  Weight: 77.5 kg    Examination:  Physical Exam: GEN: NAD, alert, chronically ill in appearance, confused HEENT: NCAT, PERRL, EOMI, sclera clear, dry mucous membranes with white patches  to posterior pharynx and mild erythema/edema PULM: CTAB w/o wheezes/crackles, normal respiratory effort, on room air CV: RRR w/o M/G/R GI: abd soft, NTND, NABS, no R/G/M GU: Foley catheter in place MSK: no peripheral edema, moves all extremities independently Integumentary: dry/intact, no rashes or wounds    Data Reviewed: I have personally reviewed following labs and imaging studies  CBC: Recent Labs  Lab 10/20/21 1020 10/20/21 1030 10/21/21 0300 10/22/21 0333 10/23/21 0424  WBC 21.1*  --  14.0* 21.5* 23.0*  NEUTROABS 15.8*  --  12.3* 19.4* 20.8*  HGB 16.5 16.0 13.6 13.9 13.5  HCT 51.1 47.0 41.3 43.2 40.9  MCV 91.3  --  90.0 91.5 90.1  PLT 316  --  258 272 353   Basic Metabolic Panel: Recent Labs  Lab 10/20/21 1020 10/20/21 1030 10/21/21 0300 10/22/21 0333 10/23/21 0424  NA 150* 151* 150* 150* 148*  K 3.8 3.7 3.9 3.3* 3.0*  CL 118* 117* 119* 119* 119*  CO2 21*  --  '22 23 22  '$ GLUCOSE 116* 124* 142* 148* 135*  BUN 50* 49* 46* 44* 33*  CREATININE 1.82* 1.90* 1.72* 1.56* 1.33*  CALCIUM 8.8*  --  8.0* 7.8* 7.3*  MG 2.1  --   --  1.9 1.6*  PHOS  --   --   --  3.6  --    GFR: Estimated Creatinine Clearance: 45.3 mL/min (A) (by C-G formula based on SCr of 1.33 mg/dL (H)). Liver Function Tests: Recent Labs  Lab 10/20/21 1020 10/21/21 0300 10/22/21 0333 10/23/21 0424  AST '30 21 26 24  '$ ALT 34 '26 24 24  '$ ALKPHOS 42 32* 29* 29*  BILITOT 0.8 0.8 0.7 0.7  PROT 6.7 5.4* 5.4* 4.7*  ALBUMIN 3.0* 2.5* 2.5* 2.2*   No results for input(s): "LIPASE", "AMYLASE" in the last 168 hours. Recent Labs  Lab 10/20/21 1545  AMMONIA 14   Coagulation Profile: Recent Labs  Lab 10/21/21 0300  INR 1.3*   Cardiac Enzymes: No results for input(s): "CKTOTAL", "CKMB", "CKMBINDEX", "TROPONINI" in the last 168 hours. BNP (last 3 results) No results for input(s): "PROBNP" in the last 8760 hours. HbA1C: No results for input(s): "HGBA1C" in the last 72 hours. CBG: No results for  input(s): "GLUCAP" in the last 168 hours. Lipid Profile: No results for input(s): "CHOL", "HDL", "LDLCALC", "TRIG", "CHOLHDL", "LDLDIRECT" in the last 72 hours. Thyroid Function Tests: Recent Labs    10/23/21 0424  TSH 7.621*  FREET4 1.10   Anemia Panel: Recent Labs    10/21/21 0300 10/22/21 0333  FERRITIN 398* 363*   Sepsis Labs: Recent Labs  Lab 10/20/21 1338 10/21/21 0300 10/21/21 0655 10/22/21 0333  PROCALCITON  --  <0.10  --   --   LATICACIDVEN 2.6* 1.4 2.0* 2.3*    Recent Results (from the past 240 hour(s))  Resp Panel by RT-PCR (Flu A&B, Covid) Anterior Nasal Swab     Status: Abnormal   Collection Time: 10/20/21 10:38 AM   Specimen: Anterior Nasal Swab  Result Value Ref Range Status   SARS Coronavirus 2 by RT PCR POSITIVE (A) NEGATIVE Final    Comment: (NOTE) SARS-CoV-2 target nucleic acids are DETECTED.  The SARS-CoV-2 RNA  is generally detectable in upper respiratory specimens during the acute phase of infection. Positive results are indicative of the presence of the identified virus, but do not rule out bacterial infection or co-infection with other pathogens not detected by the test. Clinical correlation with patient history and other diagnostic information is necessary to determine patient infection status. The expected result is Negative.  Fact Sheet for Patients: EntrepreneurPulse.com.au  Fact Sheet for Healthcare Providers: IncredibleEmployment.be  This test is not yet approved or cleared by the [place] FDA and  has been authorized for detection and/or diagnosis of SARS-CoV-2 by FDA under an Emergency Use Authorization (EUA).  This EUA will remain in effect (meaning this test can be used) for the duration of  the COVID-19 declaration under Section 564(b)(1) of the A ct, 21 U.S.C. section 360bbb-3(b)(1), unless the authorization is terminated or revoked sooner.     Influenza A by PCR NEGATIVE NEGATIVE  Final   Influenza B by PCR NEGATIVE NEGATIVE Final    Comment: (NOTE) The Xpert Xpress SARS-CoV-2/FLU/RSV plus assay is intended as an aid in the diagnosis of influenza from Nasopharyngeal swab specimens and should not be used as a sole basis for treatment. Nasal washings and aspirates are unacceptable for Xpert Xpress SARS-CoV-2/FLU/RSV testing.  Fact Sheet for Patients: EntrepreneurPulse.com.au  Fact Sheet for Healthcare Providers: IncredibleEmployment.be  This test is not yet approved or cleared by the [place] FDA and has been authorized for detection and/or diagnosis of SARS-CoV-2 by FDA under an Emergency Use Authorization (EUA). This EUA will remain in effect (meaning this test can be used) for the duration of the COVID-19 declaration under Section 564(b)(1) of the Act, 21 U.S.C. section 360bbb-3(b)(1), unless the authorization is terminated or revoked.  Performed at [facility], [place] [address] [place], [place] [address]   Blood Culture (routine x 2)     Status: None (Preliminary result)   Collection Time: 10/20/21 12:55 PM   Specimen: BLOOD  Result Value Ref Range Status   Specimen Description   Final    BLOOD BLOOD LEFT HAND Performed at [facility] [address] [place], [place] [address]    Special Requests   Final    BOTTLES DRAWN AEROBIC ONLY Blood Culture results may not be optimal due to an inadequate volume of blood received in culture bottles Performed at [facility] [address], [place], [place] [address]    Culture   Final    NO GROWTH 3 DAYS Performed at [facility], [place] [address] [place], [place] [address]    Report Status PENDING  Incomplete         Radiology Studies: CT HEAD WO CONTRAST (5MM)  Result Date: 10/21/2021 CLINICAL DATA:  Altered mental status, fall EXAM: CT HEAD WITHOUT CONTRAST TECHNIQUE: Contiguous axial images  were obtained from the base of the skull through the vertex without intravenous contrast. RADIATION DOSE REDUCTION: This exam was performed according to the departmental dose-optimization program which includes automated exposure control, adjustment of the mA and/or kV according to patient size and/or use of iterative reconstruction technique. COMPARISON:  10/20/2021 FINDINGS: Brain: No evidence of acute infarction, hemorrhage, mass, mass effect, or midline shift. No hydrocephalus or extra-axial fluid collection. Periventricular white matter changes, likely the sequela of chronic small vessel ischemic disease. Advanced cerebral atrophy for age with commensurate size of the ventricles and sulci. Vascular: No hyperdense vessel. Skull: Normal. Negative for fracture or focal lesion. Sinuses/Orbits: Minimal mucosal thickening  in the ethmoid air cells. Status post bilateral lens replacements. Other: The mastoid air cells are well aerated. IMPRESSION: No acute intracranial process. Electronically Signed   By: Merilyn Baba M.D.   On: 10/21/2021 21:30        Scheduled Meds:  Chlorhexidine Gluconate Cloth  6 each Topical Daily   enoxaparin (LOVENOX) injection  40 mg Subcutaneous Q24H   hydrocortisone sod succinate (SOLU-CORTEF) inj  50 mg Intravenous Q8H   levothyroxine  100 mcg Intravenous Daily   mouth rinse  15 mL Mouth Rinse 4 times per day   Continuous Infusions:  ampicillin-sulbactam (UNASYN) IV 3 g (10/23/21 0914)   dextrose 5 % and 0.45% NaCl 150 mL/hr at 10/22/21 2149   fluconazole (DIFLUCAN) IV Stopped (10/22/21 1459)   magnesium sulfate bolus IVPB 4 g (10/23/21 1027)   potassium chloride 10 mEq (10/23/21 0917)     LOS: 3 days    Time spent: 56 minutes spent on chart review, discussion with nursing staff, consultants, updating family and interview/physical exam; more than 50% of that time was spent in counseling and/or coordination of care.    Tekia Waterbury J [place] DO [facility] Available via Epic secure chat 7am-7pm After these hours, please refer to coverage provider listed on [email or web address] 10/23/2021, 11:05 AM

## 2021-10-23 NOTE — Progress Notes (Signed)
Daily Progress Note   Patient Name: Cole Mayer       Date: 10/23/2021 DOB: 05-21-1935  Age: 86 y.o. MRN#: [id number] Attending Physician: [place] Eric J, DO Primary Care Physician: Patient, No Pcp Per Admit Date: 10/20/2021 Length of Stay: 3 days  Reason for Consultation/Follow-up: Establishing goals of care  Subjective:   CC: Patient altered in restraints and mittens.  Subjective: Reviewed EMR prior to presenting to bedside.  Patient transferred out of ICU.  Patient remains on precautions for COVID-19.  Discussed care with bedside RN who noted patient remains in restraints and mittens.  Patient had bowel movement this morning and his confusion was combative with staff when attempting to clean.  At time of EMR review patient had not received any as needed Haldol for agitation.  Discussed care with primary hospitalist prior to calling patient's daughter. Able to call patient's daughter and would reported guardian/decision-maker, Cole Mayer good.  Cole Mayer was able to reiterate the conversation she had with the primary hospitalist.  Patient has not received any as needed Haldol and so Cole Mayer is hopeful that with maybe administration of antipsychotic, patient will calm down enough for feeding tube to be placed.  Cole Mayer offered that she knows this may not work and she is very realistic about outcomes.  She at least wants to give her father a "chance" as he was participating in physical therapy a week ago.  She has noted during his hospitalizations that there are many times that the patient would not make it through the night and then he bounces back.  She is also very honest that should patient be too combative to have feeding tube placed or should patient rip it out, feeding tube would not be replaced and that the patient saying he is done.  Cole Mayer feels that the next 24 hours are going to tell whether the patient tolerates this intervention or patient should be transitioned to comfort care  focus.  Noted there could be further discussions regarding comfort care focused on what that would entail.  Cole Mayer did state that her parents were up near an area where she believes they would need involvement from hospice of [facility].  She was not sure the set up with hospice and if he would go to a facility versus going home with hospice.  We discussed that with patient's underlying severe agitation, would likely need IV medications for management of symptoms and so would likely need inpatient hospice placement.  Patient also not eating and drinking which means his time would be short.  Cole Mayer, acknowledged this and is willing to further discuss based on feeding tube outcomes.  Cole Mayer expressed thanks for continued conversations regarding her father's care.  Informed her this palliative care provider would not be present tomorrow though the palliative care team would continue to follow along and assist as needed.  Jessica voiced appreciation for this.  Objective:   Vital Signs:  BP 124/71 (BP Location: Left Arm)   Pulse 63   Temp 97.8 F (36.6 C) (Oral)   Resp 18   Ht 6' 0.01" (1.829 m)   Wt 77.5 kg   SpO2 100%   BMI 23.17 kg/m    Assessment & Plan:   Assessment: Patient is an 86yo M with a PMHx of advanced dementia, HTN, CAD, hypothyroidism, HTN, CAD, HLD, and anxiety/depression who was admitted on 10/20/21 from [facility] from management of lethargy and difficulty swallowing over the 2 days prior to arrival. Patient's family and EMR review also  stated patient had recently been discharged from [facility] for rehab. 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 Palliative care consulted to assist with complex  medical decision making.   Recommendations/Plan: # Complex medical decision making/goals of care:  - Unable to obtain from patient secondary to medical status.  Remains agitated and in restraints and mittens.  -Patient's daughter, Cole Mayer, is patient's legal guardian as per her and wife's report.  This document not on file for review.  Patient does have a MOST form in EMR.                -Spoke with daughter today.  At this time, daughter wishes to at least attempt placement of feeding tube to see if it would help patient stabilize.  Daughter notes that if patient is too combative for it to go when or patient rips it out, that the patient showing it is "his time".  She is then willing to discuss transition to comfort focus and possible inpatient hospice transfer.  See HPI for further details.                -  Code Status: DNR   # Symptom management:  -Non-pharmacologic Delirium Precautions  - Frequent re-orientation; update board w/ date, names of treatment team  - Daytime stimulation: Open curtains, turn lights on, and turn on television as tolerated during waking hours  - Nighttime calm - close curtains, turn lights off, and turn off television at 9pm  with minimal interruptions from treatment team during sleeping hours, including avoiding lab draws if possible  - Occupy w/ distractions - e.g. ask them to fold washcloths, busy-board  - Encourage movement with PT/OT as tolerated  - Ensure adequate sensorium, to include providing glasses, dentures, and hearing aids to patient as appropriate  - Ensure adequate nutrition and fluid intake  - Avoid restraints whenever possible, but "safety first" . If restraints are necessary, please monitor CPK and BUN/Cr  - Assess and treat pain (incl nonverbal cues); e.g. consider scheduled tylenol  - Assess and treat constipation and urinary retention  - Ensure hydration, electrolyte balance (K to 4.0, Mg to 2.0), adequate oxygenation  - Review medications  (addition, deletion, changes can trigger)   # Discharge Planning: TBD; did mention idea of possible inpatient hospice placement if focus on comfort due to patient's severe agitation and symptom burden  Discussed with: bedside RN, hospitalist  Thank you for allowing [place] to participate in the care of LEEMAN JOHNSEY.  Palliative care team will continue to follow along in patient's care.  This provider spent a total of 37 minutes providing patient's care.  Includes review of EMR, discussing care with other staff members involved in patient's medical care, obtaining relevant history and information from patient and/or patient's family, and personal review of imaging and lab work. Greater than 50% of the time was spent counseling and coordinating care related to the above assessment and plan.    Chelsea Aus, DO Palliative Care Provider Team Phone # [phone] (Nights starting at 7 PM/Weekends)

## 2021-10-24 DIAGNOSIS — Z7189 Other specified counseling: Secondary | ICD-10-CM | POA: Diagnosis not present

## 2021-10-24 DIAGNOSIS — N179 Acute kidney failure, unspecified: Secondary | ICD-10-CM | POA: Diagnosis not present

## 2021-10-24 DIAGNOSIS — Z515 Encounter for palliative care: Secondary | ICD-10-CM | POA: Diagnosis not present

## 2021-10-24 LAB — COMPREHENSIVE METABOLIC PANEL
ALT: 24 U/L (ref 0–44)
AST: 27 U/L (ref 15–41)
Albumin: 2 g/dL — ABNORMAL LOW (ref 3.5–5.0)
Alkaline Phosphatase: 27 U/L — ABNORMAL LOW (ref 38–126)
Anion gap: 8 (ref 5–15)
BUN: 23 mg/dL (ref 8–23)
CO2: 21 mmol/L — ABNORMAL LOW (ref 22–32)
Calcium: 6.8 mg/dL — ABNORMAL LOW (ref 8.9–10.3)
Chloride: 114 mmol/L — ABNORMAL HIGH (ref 98–111)
Creatinine, Ser: 1.14 mg/dL (ref 0.61–1.24)
GFR, Estimated: >60 mL/min (ref >60)
Glucose, Bld: 180 mg/dL — ABNORMAL HIGH (ref 70–99)
Potassium: 3 mmol/L — ABNORMAL LOW (ref 3.5–5.1)
Sodium: 143 mmol/L (ref 135–145)
Total Bilirubin: 0.7 mg/dL (ref 0.3–1.2)
Total Protein: 4.4 g/dL — ABNORMAL LOW (ref 6.5–8.1)

## 2021-10-24 LAB — CBC WITH DIFFERENTIAL/PLATELET
Abs Immature Granulocytes: 0.47 10*3/uL — ABNORMAL HIGH (ref 0.00–0.07)
Basophils Absolute: 0.1 10*3/uL (ref 0.0–0.1)
Basophils Relative: 1 %
Eosinophils Absolute: 0 10*3/uL (ref 0.0–0.5)
Eosinophils Relative: 0 %
HCT: 39.6 % (ref 39.0–52.0)
Hemoglobin: 12.8 g/dL — ABNORMAL LOW (ref 13.0–17.0)
Immature Granulocytes: 2 %
Lymphocytes Relative: 5 %
Lymphs Abs: 0.9 10*3/uL (ref 0.7–4.0)
MCH: 29.4 pg (ref 26.0–34.0)
MCHC: 32.3 g/dL (ref 30.0–36.0)
MCV: 91 fL (ref 80.0–100.0)
Monocytes Absolute: 0.6 10*3/uL (ref 0.1–1.0)
Monocytes Relative: 3 %
Neutro Abs: 17.6 10*3/uL — ABNORMAL HIGH (ref 1.7–7.7)
Neutrophils Relative %: 89 %
Platelets: 184 10*3/uL (ref 150–400)
RBC: 4.35 MIL/uL (ref 4.22–5.81)
RDW: 13.6 % (ref 11.5–15.5)
WBC: 19.7 10*3/uL — ABNORMAL HIGH (ref 4.0–10.5)
nRBC: 0 % (ref 0.0–0.2)

## 2021-10-24 LAB — D-DIMER, QUANTITATIVE: D-Dimer, Quant: 1.46 ug/mL-FEU — ABNORMAL HIGH (ref 0.00–0.50)

## 2021-10-24 LAB — C-REACTIVE PROTEIN: CRP: 4.7 mg/dL — ABNORMAL HIGH (ref <1.0)

## 2021-10-24 LAB — MAGNESIUM: Magnesium: 2.2 mg/dL (ref 1.7–2.4)

## 2021-10-24 LAB — TSH: TSH: 3.864 u[IU]/mL (ref 0.350–4.500)

## 2021-10-24 LAB — T4, FREE: Free T4: 1 ng/dL (ref 0.61–1.12)

## 2021-10-24 MED ORDER — FLUCONAZOLE 100MG IVPB
50.0000 mg | INTRAVENOUS | Status: DC
  Filled 2021-10-24 (×4): qty 25

## 2021-10-24 NOTE — Progress Notes (Signed)
Daily Progress Note   Patient Name: Cole Mayer       Date: 10/24/2021 DOB: September 06, 1935  Age: 86 y.o. MRN#: [id number] Attending Physician: Donne Hazel, MD Primary Care Physician: Patient, No Pcp Per Admit Date: 10/20/2021 Length of Stay: 4 days  Reason for Consultation/Follow-up: Establishing goals of care  Subjective:   CC: resting in bed, no distress.   Subjective: Reviewed EMR prior to presenting to bedside.  Also discussed with bedside RN.  2 unsuccessful attempts at placing NG tube were done on 10-23-2021.  Call placed and discussed with daughter [name] good at [phone].  Reviewed with her about NG tube not being able to be placed.  She recalls having discussions with Dr Vinetta Bergamo from [facility] on 10-23-2021 with regards to outlining goals of care for the patient. See below     Objective:   Vital Signs:  BP 118/63 (BP Location: Left Arm)   Pulse (!) 55   Temp 97.6 F (36.4 C) (Oral)   Resp 16   Ht 6' 0.01" (1.829 m)   Wt 77.5 kg   SpO2 99%   BMI 23.17 kg/m    Assessment & Plan:   Assessment: Patient is an 86yo M with a PMHx of advanced dementia, HTN, CAD, hypothyroidism, HTN, CAD, HLD, and anxiety/depression who was admitted on 10/20/21 from [facility] SNF from management of lethargy and difficulty swallowing over the 2 days prior to arrival. Patient's family and EMR review also stated patient had recently been discharged from [facility] for rehab. 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 Palliative care consulted to assist with complex medical decision making.   Recommendations/Plan: # Complex medical decision making/goals of care:  - Unable to obtain from patient secondary to medical status.      -Patient's daughter, [name], is patient's legal guardian as per her and wife's report.  This document not on file for review.  Patient does have a MOST form in EMR.                -Spoke with daughter today:transition to comfort focus and possible inpatient hospice transfer.                   -  Code Status: DNR   # Symptom management:  -Non-pharmacologic Delirium Precautions  - Frequent re-orientation; update board w/ date, names of treatment team  - Daytime stimulation: Open curtains, turn lights on, and turn on television as tolerated during waking hours  - Nighttime calm - close curtains, turn lights off, and turn off television at 9pm  with minimal interruptions from treatment team during sleeping hours, including avoiding lab draws if possible  - Occupy w/ distractions - e.g. ask them to fold washcloths, busy-board  - Encourage movement with PT/OT as tolerated  - Ensure adequate sensorium, to include providing glasses, dentures, and hearing aids to patient as appropriate  - Ensure adequate nutrition and fluid intake  - Avoid restraints whenever possible, but "safety first" . If restraints are necessary, please monitor CPK and BUN/Cr  - Assess and treat pain (incl nonverbal cues); e.g. consider scheduled tylenol  - Assess and treat constipation and urinary retention  - Ensure hydration, electrolyte balance (K to 4.0, Mg to 2.0), adequate oxygenation  - Review medications (addition, deletion, changes can trigger)   #  Discharge Planning:  inpatient hospice placement to focus on comfort due to patient's  agitation and symptom burden.   Discussed with: bedside RN, hospitalist [facility] consulted.   Thank you for allowing [place] to participate in the care of HANDY MCLOUD.  Palliative care team will continue to follow along in patient's care. Mod MDM  Includes review of EMR, discussing care with other staff members involved in patient's medical care, obtaining relevant history and information  from patient and/or patient's family, and personal review of imaging and lab work. Greater than 50% of the time was spent counseling and coordinating care related to the above assessment and plan.    Loistine Chance MD.  Palliative Care Provider Team Phone # [phone] (Nights starting at 7 PM/Weekends)

## 2021-10-24 NOTE — Progress Notes (Signed)
Progress Note   Patient: Cole Mayer TMH:[id number] DOB: [birth year] DOA: 10/20/2021     4 DOS: the patient was seen and examined on 10/24/2021   Brief hospital course: 86 y.o. male with past medical history significant for advanced dementia, essential hypertension, CAD, hypothyroidism, hyperlipidemia, anxiety/depression who presented to [facility] ED on 9/30 via ambulance from [facility] with lethargy, difficulty swallowing over the last 2 days.  Patient with advanced dementia unable to participate in HPI.  Staff reported that no oral intake over the last 2 days.  At baseline minimally verbal.  History also obtained from daughter who reports recently discharged from [facility] 1 week ago.  While at a rehab, reported not eating and his daughter noticed that he was wincing when he was swallowing as if you are in pain.  No reported fevers or nausea/vomiting.  When his symptoms did not improve she requested the facility sent him to the ED for further evaluation.   In the ED, temperature 96.0 F, HR 74, RR 13, BP 141/79, SPO2 98% on room air.  WBC 21.1, hemoglobin 16.5, platelets 316.  Sodium 150, potassium 3.8, chloride 118, CO2 21, glucose 116, BUN 50, creatinine 1.82.  AST 30, ALT 34, total bilirubin 0.8.  High sensitive troponin 74> 63.  Lactic acid 2.6.  INR 1.3.  TSH 35.047.  COVID-19 PCR positive.  Influenza A/B PCR negative.  Urinalysis unrevealing.  CT head without contrast with no acute intracranial normalities, but notable chronic microvascular disease and brain atrophy.  CT soft tissue neck with pharyngitis/supraglottitis, no abscess or airway obstruction.  Chest x-ray with no acute cardiopulmonary disease process.  Blood cultures x2 obtained.  EDP discussed with ENT, Dr. Redmond Baseman who recommended medical admission with IV antibiotics, IV steroids.  TRH consulted for admission for further evaluation and treatment. Given poor PO intake, attempted trial of NG, however was not successful. Per  Palliative Care, family decision was to transition to comfort measures  Assessment and Plan: Acute metabolic encephalopathy Patient presenting to ED with from SNF with lethargy, reduced oral intake in the setting of underlying advanced dementia.  Recently discharged from [facility].  Etiology likely multifactorial with active infection with pharyngitis/supraglottitis, COVID-19 viral infection, and possible underlying myxedema. --Continued supportive care, treatment as below   COVID-19 viral infection COVID-19 PCR positive.  Chest x-ray with no acute cardiopulmonary disease process.  Unable to tolerate oral intake and completed 3-day course of remdesivir --Continue monitoring of SPO2, maintain >92% --Continue airborne/contact isolation for 10 days from initial diagnosis on 9/30   Pharyngitis/supraglottitis WBC count elevated 21.1 on admission.  CT soft tissue neck with pharyngitis/supraglottitis with no abscess or airway obstruction.  EDP discussed with ENT on-call, Dr. Redmond Baseman who recommended medical admission with IV antibiotics, IV steroids. --On Unasyn 3 g IV every 6 hours --Hydrocortisone 50 mg IV every 8 hours for possible myxedema coma as below --Supportive care --Now on comfort measures per below   Oropharyngeal candidiasis --Fluconazole 50 mg IV every 24 hours   Hypothyroidism Possible myxedema Patient presenting with confusion from his typical baseline, lethargy.  TSH elevated at 35.  Unlikely taking his medications.  Patient was notably hypothermic leading to concern for possible myxedema. -- Cortisol level: 8.1 -- TSH 35.047>5.841>7.621 -- Free T4 0.99>1.10 -- Levothyroxine 100 mcg IV daily -- DC Liothyronine 10/2 -- Hydrocortisone 50 mg IV q8h  -- Bair hugger now discontinued as normothermic --Now on comfort measures per below   Acute renal failure on CKD stage IIIb  Creatinine elevated 1.90 on admission.  Baseline of 1.24 on 10/10/2021.  Etiology likely secondary to  severe dehydration in the setting of inability to tolerate oral intake.  Renal ultrasounds with mild left hydronephrosis, 4 mm calculus inferior left kidney, fullness of the right renal pelvis without frank hydronephrosis, slightly decreased size right kidney in comparison to ultrasound in 2021, limited exam due to overlying bowel gas. --Continued on IV fluid hydration w/ D5 1/2 NS at 162m/h   Hypernatremia 2/2 severe dehydration Na 150 on admission, likely secondary to severe dehydration in the setting of poor oral intake complicated by dysphagia and pharyngitis/supraglottitis. -- Currently n.p.o. per SLP recommendations -- Continued IV fluid hydration per above -- Attempted NG tube, however failed. Pt did not tolerate. Discussed with Palliative Care. Recs for transition to comfort measures with residential hospice   Hypokalemia Potassium 3.0 this morning, likely secondary to poor oral intake.  -now transitioned to comfort care   Essential hypertension On amlodipine 2.5 mg p.o. daily at home.  BP 125/76, well controlled. --Continue to hold antihypertensives, unable to tolerate oral intake at this time --had been on Hydralazine '10mg'$  IV q6h PRN SBP >165 or DBP >110   CAD --Holding home aspirin due to intolerance of oral intake   Hyperlipidemia On simvastatin 40 mg daily at home. -- Holding oral medications at this time   Dysphagia -- SLP following, had recommended n.p.o. -- Failed trial of NG placement. Palliative Care following, now recs to transition to comfort measures   Advanced dementia --Delirium precautions --Get up during the day --Encourage a familiar face to remain present throughout the day --Keep blinds open and lights on during daylight hours --Minimize the use of opioids/benzodiazepines --Holding home benazepril and Namenda due to inability to tolerate oral intake at this time --Haldol PRN -Now transition to comfort measures  DNR  End of Life -Unable to  tolerate PO and now not tolerating NG placement of artifical nutrition -Palliative Care following, decision noted for transition to comfort measures. Residential hospice pending      Subjective: Pleasantly confused  Physical Exam: Vitals:   10/23/21 2147 10/24/21 0102 10/24/21 0514 10/24/21 1337  BP: (!) 140/83  118/63 134/78  Pulse: (!) 58  (!) 55 (!) 51  Resp:   16 16  Temp:  (!) 96.7 F (35.9 C) 97.6 F (36.4 C) (!) 97.5 F (36.4 C)  TempSrc:  Axillary Oral Oral  SpO2:   99% 93%  Weight:      Height:       General exam: Awake, laying in bed, in nad Respiratory system: Normal respiratory effort, no wheezing Cardiovascular system: regular rate, s1, s2 Gastrointestinal system: Soft, nondistended, positive BS Central nervous system: CN2-12 grossly intact, strength intact Extremities: Perfused, no clubbing Skin: Normal skin turgor, no notable skin lesions seen Psychiatry: Mood normal // no visual hallucinations   Data Reviewed:  Labs reviewed: Na 143, K 3.0, Cr 1.14  Family Communication: Pt in room, family not at bedside  Disposition: Status is: Inpatient Remains inpatient appropriate because: Severity of illness  Planned Discharge Destination:  Residential hospice    Author: SMarylu Lund MD 10/24/2021 1:50 PM  For on call review [email or web address]

## 2021-10-24 NOTE — Progress Notes (Signed)
SLP Cancellation Note  Patient Details Name: Cole Mayer MRN: [id number] DOB: Jun 28, 1935   Cancelled treatment:       Reason Eval/Treat Not Completed: Other (comment) (Note pt not able to tolerate NG tube placement attempts and change of care plan to comfort; will sign off at this time. Thanks.)  Kathleen Lime, MS [facility] SLP Acute Rehab Services Office [phone] Pager [phone]  Macario Golds 10/24/2021, 11:43 AM

## 2021-10-24 NOTE — TOC Progression Note (Signed)
Transition of Care [facility]) - Progression Note    Patient Details  Name: Cole Mayer MRN: [id number] Date of Birth: 1935/10/17  Transition of Care [facility]) CM/SW Contact  Leeroy Cha, RN Phone Number: 10/24/2021, 12:59 PM  Clinical Narrative:     Referral for inpatient residential hospice made to [facility].       Expected Discharge Plan and Services                                                 Social Determinants of Health (SDOH) Interventions    Readmission Risk Interventions   No data to display

## 2021-10-24 NOTE — Progress Notes (Signed)
Attempt NG placement x2. Both attempts were unsuccessful with NG going to lung as patient is not able to swallow or keep head forward during placement. Chui MD notified.

## 2021-10-24 NOTE — Hospital Course (Signed)
86 y.o. male with past medical history significant for advanced dementia, essential hypertension, CAD, hypothyroidism, hyperlipidemia, anxiety/depression who presented to [facility] ED on 9/30 via ambulance from [facility] with lethargy, difficulty swallowing over the last 2 days.  Patient with advanced dementia unable to participate in HPI.  Staff reported that no oral intake over the last 2 days.  At baseline minimally verbal.  History also obtained from daughter who reports recently discharged from [facility] 1 week ago.  While at a rehab, reported not eating and his daughter noticed that he was wincing when he was swallowing as if you are in pain.  No reported fevers or nausea/vomiting.  When his symptoms did not improve she requested the facility sent him to the ED for further evaluation.   In the ED, temperature 96.0 F, HR 74, RR 13, BP 141/79, SPO2 98% on room air.  WBC 21.1, hemoglobin 16.5, platelets 316.  Sodium 150, potassium 3.8, chloride 118, CO2 21, glucose 116, BUN 50, creatinine 1.82.  AST 30, ALT 34, total bilirubin 0.8.  High sensitive troponin 74> 63.  Lactic acid 2.6.  INR 1.3.  TSH 35.047.  COVID-19 PCR positive.  Influenza A/B PCR negative.  Urinalysis unrevealing.  CT head without contrast with no acute intracranial normalities, but notable chronic microvascular disease and brain atrophy.  CT soft tissue neck with pharyngitis/supraglottitis, no abscess or airway obstruction.  Chest x-ray with no acute cardiopulmonary disease process.  Blood cultures x2 obtained.  EDP discussed with ENT, Dr. Redmond Baseman who recommended medical admission with IV antibiotics, IV steroids.  TRH consulted for admission for further evaluation and treatment. Given poor PO intake, attempted trial of NG, however was not successful. Per Palliative Care, family decision was to transition to comfort measures

## 2021-10-25 DIAGNOSIS — Z515 Encounter for palliative care: Secondary | ICD-10-CM | POA: Diagnosis not present

## 2021-10-25 DIAGNOSIS — Z7189 Other specified counseling: Secondary | ICD-10-CM | POA: Diagnosis not present

## 2021-10-25 DIAGNOSIS — N179 Acute kidney failure, unspecified: Secondary | ICD-10-CM | POA: Diagnosis not present

## 2021-10-25 LAB — CULTURE, BLOOD (ROUTINE X 2): Culture: NO GROWTH

## 2021-10-25 MED ORDER — LORAZEPAM 2 MG/ML IJ SOLN: 0.5000 mg | INTRAMUSCULAR | Status: DC | PRN

## 2021-10-25 MED ORDER — MORPHINE SULFATE (PF) 2 MG/ML IV SOLN: 1.0000 mg | INTRAVENOUS | Status: DC | PRN

## 2021-10-25 NOTE — TOC Progression Note (Addendum)
Transition of Care [facility]) - Progression Note    Patient Details  Name: Cole Mayer MRN: [id number] Date of Birth: [birth year]  Transition of Care [facility]) CM/SW Contact  Leeroy Cha, RN Phone Number: 10/25/2021, 7:59 AM  Clinical Narrative:    [facility] /intake-amy/please out referral into the hub. Request sent in hub.        Expected Discharge Plan and Services                                                 Social Determinants of Health (SDOH) Interventions    Readmission Risk Interventions   No data to display

## 2021-10-25 NOTE — TOC Progression Note (Signed)
Transition of Care [facility]) - Progression Note    Patient Details  Name: Cole Mayer MRN: [id number] Date of Birth: Nov 04, 1935  Transition of Care [facility]) CM/SW Contact  Lennart Pall, LCSW Phone Number: 10/25/2021, 2:53 PM  Clinical Narrative:    Have alerted pt's daughter, [name], that pt is medically approved for Residential Hospice bed at [facility] and hopeful to have available bed tomorrow.        Expected Discharge Plan and Services                                                 Social Determinants of Health (SDOH) Interventions    Readmission Risk Interventions     No data to display

## 2021-10-25 NOTE — Progress Notes (Signed)
Progress Note   Patient: Cole Mayer OVZ:[id number] DOB: 06/05/1935 DOA: 10/20/2021     5 DOS: the patient was seen and examined on 10/25/2021   Brief hospital course: 86 y.o. male with past medical history significant for advanced dementia, essential hypertension, CAD, hypothyroidism, hyperlipidemia, anxiety/depression who presented to [facility] ED on 9/30 via ambulance from [facility] with lethargy, difficulty swallowing over the last 2 days.  Patient with advanced dementia unable to participate in HPI.  Staff reported that no oral intake over the last 2 days.  At baseline minimally verbal.  History also obtained from daughter who reports recently discharged from [facility] 1 week ago.  While at a rehab, reported not eating and his daughter noticed that he was wincing when he was swallowing as if you are in pain.  No reported fevers or nausea/vomiting.  When his symptoms did not improve she requested the facility sent him to the ED for further evaluation.   In the ED, temperature 96.0 F, HR 74, RR 13, BP 141/79, SPO2 98% on room air.  WBC 21.1, hemoglobin 16.5, platelets 316.  Sodium 150, potassium 3.8, chloride 118, CO2 21, glucose 116, BUN 50, creatinine 1.82.  AST 30, ALT 34, total bilirubin 0.8.  High sensitive troponin 74> 63.  Lactic acid 2.6.  INR 1.3.  TSH 35.047.  COVID-19 PCR positive.  Influenza A/B PCR negative.  Urinalysis unrevealing.  CT head without contrast with no acute intracranial normalities, but notable chronic microvascular disease and brain atrophy.  CT soft tissue neck with pharyngitis/supraglottitis, no abscess or airway obstruction.  Chest x-ray with no acute cardiopulmonary disease process.  Blood cultures x2 obtained.  EDP discussed with ENT, Dr. Redmond Baseman who recommended medical admission with IV antibiotics, IV steroids.  TRH consulted for admission for further evaluation and treatment. Given poor PO intake, attempted trial of NG, however was not successful. Per  Palliative Care, family decision was to transition to comfort measures  Assessment and Plan: Acute metabolic encephalopathy Patient presenting to ED with from SNF with lethargy, reduced oral intake in the setting of underlying advanced dementia.  Recently discharged from [facility].  Etiology likely multifactorial with active infection with pharyngitis/supraglottitis, COVID-19 viral infection, and possible underlying myxedema. --See below, pt transitioned to comfort measures only   COVID-19 viral infection COVID-19 PCR positive.  Chest x-ray with no acute cardiopulmonary disease process.  Unable to tolerate oral intake and completed 3-day course of remdesivir --Continue airborne/contact isolation for 10 days from initial diagnosis on 9/30 -now focus on comfort   Pharyngitis/supraglottitis WBC count elevated 21.1 on admission.  CT soft tissue neck with pharyngitis/supraglottitis with no abscess or airway obstruction.  EDP discussed with ENT on-call, Dr. Redmond Baseman who recommended medical admission with IV antibiotics, IV steroids. --Was given Unasyn 3 g IV every 6 hours --Was given Hydrocortisone 50 mg IV every 8 hours for possible myxedema coma as below --Now on comfort measures per below   Oropharyngeal candidiasis --Was given Fluconazole 50 mg IV every 24 hours -Now comfort measures   Hypothyroidism Possible myxedema Patient presenting with confusion from his typical baseline, lethargy.  TSH elevated at 35.  Unlikely taking his medications.  Patient was notably hypothermic leading to concern for possible myxedema. - Bair hugger later discontinued as normothermic --Now on comfort measures per below   Acute renal failure on CKD stage IIIb Creatinine elevated 1.90 on admission.  Baseline of 1.24 on 10/10/2021.  Etiology likely secondary to severe dehydration in the setting of  inability to tolerate oral intake.  Renal ultrasounds with mild left hydronephrosis, 4 mm calculus inferior left  kidney, fullness of the right renal pelvis without frank hydronephrosis, slightly decreased size right kidney in comparison to ultrasound in 2021, limited exam due to overlying bowel gas. --now comfort measures   Hypernatremia 2/2 severe dehydration Na 150 on admission, likely secondary to severe dehydration in the setting of poor oral intake complicated by dysphagia and pharyngitis/supraglottitis. -NPO recommended by SLP -- Attempted NG tube, however failed. Pt did not tolerate. Discussed with Palliative Care. Recs for transition to comfort measures with residential hospice   Hypokalemia Potassium 3.0 this morning, likely secondary to poor oral intake.  -now transitioned to comfort care   Essential hypertension On amlodipine 2.5 mg p.o. daily at home.  BP 125/76, well controlled. --Continue to hold antihypertensives, unable to tolerate oral intake at this time --had been on Hydralazine '10mg'$  IV q6h PRN SBP >165 or DBP >110   CAD --Holding home aspirin due to intolerance of oral intake   Hyperlipidemia On simvastatin 40 mg daily at home. -- Holding oral medications at this time   Dysphagia -- SLP following, had recommended n.p.o. -- Failed trial of NG placement. Palliative Care following, now recs to transition to comfort measures   Advanced dementia --Delirium precautions --Get up during the day --Encourage a familiar face to remain present throughout the day --Keep blinds open and lights on during daylight hours --Minimize the use of opioids/benzodiazepines --Holding home benazepril and Namenda due to inability to tolerate oral intake at this time --Haldol PRN -Now transition to comfort measures  DNR  End of Life -Unable to tolerate PO and now not tolerating NG placement of artifical nutrition -Palliative Care following, decision noted for transition to comfort measures. Residential hospice pending      Subjective: remains pleasantly confused  Physical Exam: Vitals:    10/24/21 1337 10/24/21 2228 10/24/21 2231 10/25/21 0632  BP: 134/78 (!) 143/83  116/74  Pulse: (!) 51 69  (!) 59  Resp: '16 20  18  '$ Temp: (!) 97.5 F (36.4 C) (!) 97.2 F (36.2 C)  97.6 F (36.4 C)  TempSrc: Oral Oral  Oral  SpO2: 93%  98% 96%  Weight:      Height:       General exam: Conversant, in no acute distress Respiratory system: normal chest rise, clear, no audible wheezing Cardiovascular system: regular rhythm, s1-s2 Gastrointestinal system: Nondistended, nontender, pos BS Central nervous system: No seizures, no tremors Extremities: No cyanosis, no joint deformities Skin: No rashes, no pallor Psychiatry: Affect normal // no auditory hallucinations   Data Reviewed:  There are no new results to review at this time.  Family Communication: Pt in room, family not at bedside  Disposition: Status is: Inpatient Remains inpatient appropriate because: Severity of illness  Planned Discharge Destination:  Residential hospice    Author: Marylu Lund, MD 10/25/2021 5:30 PM  For on call review [email or web address].

## 2021-10-25 NOTE — Progress Notes (Signed)
Daily Progress Note   Patient Name: Cole Mayer       Date: 10/25/2021 DOB: 02-Jun-1935  Age: 86 y.o. MRN#: [id number] Attending Physician: Donne Hazel, MD Primary Care Physician: Patient, No Pcp Per Admit Date: 10/20/2021 Length of Stay: 5 days  Reason for Consultation/Follow-up: Establishing goals of care  Subjective:   CC: resting in bed, no distress.   Subjective: Reviewed EMR prior to presenting to bedside.  Also discussed with other members of the team. Call placed and discussed with hospice of [facility] liaison as well, at this time, the patient's chart is to be reviewed by their medical director and pending bed availability.        Objective:   Vital Signs:  BP 116/74 (BP Location: Right Arm)   Pulse (!) 59   Temp 97.6 F (36.4 C) (Oral)   Resp 18   Ht 6' 0.01" (1.829 m)   Wt 77.5 kg   SpO2 96%   BMI 23.17 kg/m    Assessment & Plan:   Assessment: Patient is an 86yo M with a PMHx of advanced dementia, HTN, CAD, hypothyroidism, HTN, CAD, HLD, and anxiety/depression who was admitted on 10/20/21 from [facility] SNF from management of lethargy and difficulty swallowing over the 2 days prior to arrival. Patient's family and EMR review also stated patient had recently been discharged from [facility] for rehab. 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 Palliative care consulted to assist with complex medical decision making.   Recommendations/Plan: # Complex medical decision making/goals of care:  - Unable to obtain from patient secondary to medical status.     -Patient's daughter, Cole Mayer, is patient's legal guardian as per her and wife's report.  This document not on file for review.  Patient  does have a MOST form in EMR.                -Spoke with daughter on 10-24-21:transition to comfort focus and possible inpatient hospice transfer.  Awaiting hospice recommendations and bed availability.                 -  Code Status: DNR   # Symptom management:  -Non-pharmacologic Delirium Precautions  - Frequent re-orientation; update board w/ date, names of treatment team  - Daytime stimulation: Open curtains, turn lights on, and turn on television as tolerated during waking hours  - Nighttime calm - close curtains, turn lights off, and turn off television at 9pm  with minimal interruptions from treatment team during sleeping hours, including avoiding lab draws if possible  - Occupy w/ distractions - e.g. ask them to fold washcloths, busy-board  - Encourage movement with PT/OT as tolerated  - Ensure adequate sensorium, to include providing glasses, dentures, and hearing aids to patient as appropriate  - Ensure adequate nutrition and fluid intake  - Avoid restraints whenever possible, but "safety first" . If restraints are necessary, please monitor CPK and BUN/Cr  - Assess and treat pain (incl nonverbal cues); e.g. consider scheduled tylenol  - Assess and treat constipation and urinary retention  - Ensure hydration, electrolyte balance (K to 4.0, Mg to 2.0), adequate oxygenation  - Review medications (addition, deletion, changes can trigger)   # Discharge Planning:  inpatient hospice placement to focus on comfort due to patient's  agitation and symptom burden.  Discussed with: bedside RN, hospitalist [facility] consulted.   Thank you for allowing [place] to participate in the care of Cole Mayer.  Palliative care team will continue to follow along in patient's care. Mod MDM  Includes review of EMR, discussing care with other staff members involved in patient's medical care, obtaining relevant history and information from patient and/or patient's family, and personal review of imaging and lab  work. Greater than 50% of the time was spent counseling and coordinating care related to the above assessment and plan.    Loistine Chance MD.  Palliative Care Provider Team Phone # [phone] (Nights starting at 7 PM/Weekends)

## 2021-10-26 DIAGNOSIS — Z515 Encounter for palliative care: Secondary | ICD-10-CM | POA: Diagnosis not present

## 2021-10-26 DIAGNOSIS — N179 Acute kidney failure, unspecified: Secondary | ICD-10-CM | POA: Diagnosis not present

## 2021-10-26 DIAGNOSIS — Z7189 Other specified counseling: Secondary | ICD-10-CM | POA: Diagnosis not present

## 2021-10-26 NOTE — TOC Progression Note (Addendum)
Transition of Care [facility]) - Progression Note    Patient Details  Name: Cole Mayer MRN: [id number] Date of Birth: July 27, 1935  Transition [facility]) CM/SW Contact  Lennart Pall, LCSW Phone Number: 10/26/2021, 1:09 PM  Clinical Narrative:    Continue to await residential Hospice bed in [facility] - hopeful will have one open over weekend.  Daughter updated.        Expected Discharge Plan and Services                                                 Social Determinants of Health (SDOH) Interventions    Readmission Risk Interventions     No data to display

## 2021-10-26 NOTE — Progress Notes (Signed)
Daily Progress Note   Patient Name: Cole Mayer       Date: 10/26/2021 DOB: 23-Apr-1935  Age: 86 y.o. MRN#: [id number] Attending Physician: Donne Hazel, MD Primary Care Physician: Patient, No Pcp Per Admit Date: 10/20/2021 Length of Stay: 6 days  Reason for Consultation/Follow-up: Establishing goals of care  Subjective:   CC: resting in bed, no distress.   Subjective: Reviewed EMR prior to presenting to bedside.  Also discussed with other members of the team. Call placed and discussed with hospice of [place] [facility] on 10-25-21 as well, awaiting approval and transfer to hospice pending bed availability.      Oral intake is nil, recommend continuation of symptom focused mode of care.   Objective:   Vital Signs:  BP 122/74 (BP Location: Left Arm)   Pulse 76   Temp (!) 97.3 F (36.3 C) (Oral)   Resp 18   Ht 6' 0.01" (1.829 m)   Wt 77.5 kg   SpO2 (!) 75%   BMI 23.17 kg/m    Assessment & Plan:   Assessment: Patient is an 86yo M with a PMHx of advanced dementia, HTN, CAD, hypothyroidism, HTN, CAD, HLD, and anxiety/depression who was admitted on 10/20/21 from [facility] from management of lethargy and difficulty swallowing over the 2 days prior to arrival. Patient's family and EMR review also stated patient had recently been discharged from [facility] for rehab. 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 Palliative care consulted to assist with complex medical decision making.   Recommendations/Plan: # Complex medical decision making/goals of care:  - Unable to obtain from patient secondary to medical status.     -Patient's daughter, Cole Mayer, is patient's legal guardian as per her and wife's report.   This document not on file for review.  Patient does have a MOST form in EMR.                -Spoke with daughter on 10-24-21:transition to comfort focus and possible inpatient hospice transfer.  Awaiting hospice recommendations and bed availability.                 -  Code Status: DNR   # Symptom management:  -Non-pharmacologic Delirium Precautions  - Frequent re-orientation; update board w/ date, names of treatment team  - Daytime stimulation: Open curtains, turn lights on, and turn on television as tolerated during waking hours  - Nighttime calm - close curtains, turn lights off, and turn off television at 9pm  with minimal interruptions from treatment team during sleeping hours, including avoiding lab draws if possible  - Occupy w/ distractions - e.g. ask them to fold washcloths, busy-board  - Encourage movement with PT/OT as tolerated  - Ensure adequate sensorium, to include providing glasses, dentures, and hearing aids to patient as appropriate  - Ensure adequate nutrition and fluid intake  - Avoid restraints whenever possible, but "safety first" . If restraints are necessary, please monitor CPK and BUN/Cr  - Assess and treat pain (incl nonverbal cues); e.g. consider scheduled tylenol  - Assess and treat constipation and urinary retention  - Ensure hydration, electrolyte balance (K to 4.0, Mg to 2.0), adequate oxygenation  - Review medications (addition, deletion, changes can trigger)   # Discharge Planning:  inpatient hospice placement to focus on comfort due to  patient's  agitation and symptom burden.   Discussed with: bedside RN,   TOC consulted.   Thank you for allowing [place] to participate in the care of Cole Mayer.  Palliative care team will continue to follow along in patient's care. Low MDM  Includes review of EMR, discussing care with other staff members involved in patient's medical care, obtaining relevant history and information from patient and/or patient's family, and  personal review of imaging and lab work. Greater than 50% of the time was spent counseling and coordinating care related to the above assessment and plan.    Loistine Chance MD.  Palliative Care Provider Team Phone # [phone] (Nights starting at 7 PM/Weekends)

## 2021-10-26 NOTE — Progress Notes (Signed)
Progress Note   Patient: Cole Mayer LOV:[id number] DOB: 03-Aug-1935 DOA: 10/20/2021     6 DOS: the patient was seen and examined on 10/26/2021   Brief hospital course: 86 y.o. male with past medical history significant for advanced dementia, essential hypertension, CAD, hypothyroidism, hyperlipidemia, anxiety/depression who presented to [facility] ED on 9/30 via ambulance from [facility] with lethargy, difficulty swallowing over the last 2 days.  Patient with advanced dementia unable to participate in HPI.  Staff reported that no oral intake over the last 2 days.  At baseline minimally verbal.  History also obtained from daughter who reports recently discharged from [facility] 1 week ago.  While at a rehab, reported not eating and his daughter noticed that he was wincing when he was swallowing as if you are in pain.  No reported fevers or nausea/vomiting.  When his symptoms did not improve she requested the facility sent him to the ED for further evaluation.   In the ED, temperature 96.0 F, HR 74, RR 13, BP 141/79, SPO2 98% on room air.  WBC 21.1, hemoglobin 16.5, platelets 316.  Sodium 150, potassium 3.8, chloride 118, CO2 21, glucose 116, BUN 50, creatinine 1.82.  AST 30, ALT 34, total bilirubin 0.8.  High sensitive troponin 74> 63.  Lactic acid 2.6.  INR 1.3.  TSH 35.047.  COVID-19 PCR positive.  Influenza A/B PCR negative.  Urinalysis unrevealing.  CT head without contrast with no acute intracranial normalities, but notable chronic microvascular disease and brain atrophy.  CT soft tissue neck with pharyngitis/supraglottitis, no abscess or airway obstruction.  Chest x-ray with no acute cardiopulmonary disease process.  Blood cultures x2 obtained.  EDP discussed with ENT, Dr. Redmond Baseman who recommended medical admission with IV antibiotics, IV steroids.  TRH consulted for admission for further evaluation and treatment. Given poor PO intake, attempted trial of NG, however was not successful. Per  Palliative Care, family decision was to transition to comfort measures  Assessment and Plan: Acute metabolic encephalopathy Patient presenting to ED with from SNF with lethargy, reduced oral intake in the setting of underlying advanced dementia.  Recently discharged from [facility].  Etiology likely multifactorial with active infection with pharyngitis/supraglottitis, COVID-19 viral infection, and possible underlying myxedema. --See below, pt now on comfort measures only. Appreciate assistance by Palliative Care   COVID-19 viral infection COVID-19 PCR positive.  Chest x-ray with no acute cardiopulmonary disease process.  Unable to tolerate oral intake and completed 3-day course of remdesivir --Continue airborne/contact isolation for 10 days from initial diagnosis on 9/30 -now focus on comfort   Pharyngitis/supraglottitis WBC count elevated 21.1 on admission.  CT soft tissue neck with pharyngitis/supraglottitis with no abscess or airway obstruction.  EDP discussed with ENT on-call, Dr. Redmond Baseman who recommended medical admission with IV antibiotics, IV steroids. --Was given Unasyn 3 g IV every 6 hours --Was given Hydrocortisone 50 mg IV every 8 hours for possible myxedema coma as below --Now on comfort measures per below   Oropharyngeal candidiasis --Was given Fluconazole 50 mg IV every 24 hours -Now comfort measures   Hypothyroidism Possible myxedema Patient presenting with confusion from his typical baseline, lethargy.  TSH elevated at 35.  Unlikely taking his medications.  Patient was notably hypothermic leading to concern for possible myxedema. - Bair hugger later discontinued as normothermic --Now on comfort measures per below   Acute renal failure on CKD stage IIIb Creatinine elevated 1.90 on admission.  Baseline of 1.24 on 10/10/2021.  Etiology likely secondary to severe  dehydration in the setting of inability to tolerate oral intake.  Renal ultrasounds with mild left  hydronephrosis, 4 mm calculus inferior left kidney, fullness of the right renal pelvis without frank hydronephrosis, slightly decreased size right kidney in comparison to ultrasound in 2021, limited exam due to overlying bowel gas. -Most recent renal function noted to be improved --now comfort measures   Hypernatremia 2/2 severe dehydration Na 150 on admission, likely secondary to severe dehydration in the setting of poor oral intake complicated by dysphagia and pharyngitis/supraglottitis. -NPO recommended by SLP -- Attempted NG tube, however failed. Pt did not tolerate. Discussed with Palliative Care. Recs for transition to comfort measures with residential hospice   Hypokalemia Potassium 3.0 this morning, likely secondary to poor oral intake.  -now transitioned to comfort care   Essential hypertension On amlodipine 2.5 mg p.o. daily at home.  BP 125/76, well controlled. --Continue to hold antihypertensives, unable to tolerate oral intake at this time --had been on Hydralazine '10mg'$  IV q6h PRN SBP >165 or DBP >110   CAD --Holding home aspirin due to intolerance of oral intake   Hyperlipidemia On simvastatin 40 mg daily at home. -- Holding oral medications at this time   Dysphagia -- SLP following, had recommended n.p.o. -- Failed trial of NG placement. Palliative Care following, now recs to transition to comfort measures - Will allow comfort feeding as able   Advanced dementia --Delirium precautions --Get up during the day --Encourage a familiar face to remain present throughout the day --Keep blinds open and lights on during daylight hours --Minimize the use of opioids/benzodiazepines --Holding home benazepril and Namenda due to inability to tolerate oral intake at this time --Haldol PRN -Later transitioned to comfort measures  DNR  End of Life -Unable to tolerate PO and now not tolerating NG placement of artifical nutrition -Palliative Care following, decision noted for  transition to comfort measures. Residential hospice pending      Subjective: Pleasantly confused. Reports not feeling hungry  Physical Exam: Vitals:   10/25/21 1400 10/25/21 1939 10/26/21 0638 10/26/21 1212  BP: 115/82 (!) 145/72 122/74 118/68  Pulse: 86 94 76 71  Resp: '18 18 18 18  '$ Temp: 97.8 F (36.6 C)  (!) 97.3 F (36.3 C) (!) 97.4 F (36.3 C)  TempSrc: Oral  Oral Oral  SpO2: 96% (!) 75%  97%  Weight:      Height:       General exam: Awake, laying in bed, in nad Respiratory system: Normal respiratory effort, no wheezing Cardiovascular system: regular rate, s1, s2 Gastrointestinal system: Soft, nondistended, positive BS Central nervous system: CN2-12 grossly intact, strength intact Extremities: Perfused, no clubbing Skin: Normal skin turgor, no notable skin lesions seen Psychiatry: Mood normal // no visual hallucinations   Data Reviewed:  There are no new results to review at this time.  Family Communication: Pt in room, family not at bedside  Disposition: Status is: Inpatient Remains inpatient appropriate because: Severity of illness  Planned Discharge Destination:  Residential hospice    Author: Marylu Lund, MD 10/26/2021 3:26 PM  For on call review [email or web address].

## 2021-10-26 NOTE — Progress Notes (Signed)
Assumed care at 2:45am . Per previous RN Patient is full comfort. No access per previous RN. Posey belt on without order, posey discontinued .

## 2021-10-27 DIAGNOSIS — Z7189 Other specified counseling: Secondary | ICD-10-CM | POA: Diagnosis not present

## 2021-10-27 DIAGNOSIS — N179 Acute kidney failure, unspecified: Secondary | ICD-10-CM | POA: Diagnosis not present

## 2021-10-27 NOTE — Progress Notes (Signed)
Daily Progress Note   Patient Name: Cole Mayer       Date: 10/27/2021 DOB: [birth year]  Age: 86 y.o. MRN#: [id number] Attending Physician: Cole Hazel, MD Primary Care Physician: Patient, No Pcp Per Admit Date: 10/20/2021 Length of Stay: 7 days  Reason for Consultation/Follow-up: Establishing goals of care  Subjective:   CC: resting in bed, no distress.   Subjective: Reviewed EMR prior to presenting to bedside.  Also discussed with other members of the team. Call placed and discussed with hospice of [place] [facility] on 10-25-21 as well, awaiting approval and transfer to hospice pending bed availability.      Oral intake is nil, recommend continuation of symptom focused mode of care.  Daughter at bedside this morning, reviewed comfort care again.   Objective:   Vital Signs:  BP 121/66 (BP Location: Left Wrist)   Pulse 64   Temp 98.2 F (36.8 C) (Oral)   Resp 16   Ht 6' 0.01" (1.829 m)   Wt 77.5 kg   SpO2 95%   BMI 23.17 kg/m    Assessment & Plan:   Assessment: Patient is an 85yo M with a PMHx of advanced dementia, HTN, CAD, hypothyroidism, HTN, CAD, HLD, and anxiety/depression who was admitted on 10/20/21 from [facility] SNF from management of lethargy and difficulty swallowing over the 2 days prior to arrival. Patient's family and EMR review also stated patient had recently been discharged from [facility] for rehab. 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 Palliative care consulted to assist with complex medical decision making.   Recommendations/Plan: # Complex medical decision making/goals of care:  - Unable to obtain from patient secondary to medical status.     -Patient's daughter, Cole Mayer, is  patient's legal guardian as per her and wife's report.  This document not on file for review.  Patient does have a MOST form in EMR.                -Spoke with daughter on 10-24-21:transition to comfort focus and possible inpatient hospice transfer.  Awaiting hospice recommendations and bed availability. Also met with daughter at bedside this morning and talked about comfort care. Nil Po intake.                 -  Code Status: DNR   # Symptom management:  -Non-pharmacologic Delirium Precautions  - Frequent re-orientation; update board w/ date, names of treatment team  - Daytime stimulation: Open curtains, turn lights on, and turn on television as tolerated during waking hours  - Nighttime calm - close curtains, turn lights off, and turn off television at 9pm  with minimal interruptions from treatment team during sleeping hours, including avoiding lab draws if possible  - Occupy w/ distractions - e.g. ask them to fold washcloths, busy-board  - Encourage movement with PT/OT as tolerated  - Ensure adequate sensorium, to include providing glasses, dentures, and hearing aids to patient as appropriate  - Ensure adequate nutrition and fluid intake  - Avoid restraints whenever possible, but "safety first" . If restraints are necessary, please monitor CPK and BUN/Cr  - Assess and treat pain (incl nonverbal cues); e.g. consider scheduled tylenol  - Assess and treat constipation and urinary retention  - Ensure hydration, electrolyte balance (K to 4.0, Mg to 2.0), adequate oxygenation  -  Review medications (addition, deletion, changes can trigger)   # Discharge Planning:  inpatient hospice placement to focus on comfort due to patient's  agitation and symptom burden.   Discussed with: bedside RN,   TOC consulted.   Thank you for allowing [place] to participate in the care of Cole Mayer.  Palliative care team will continue to follow along in patient's care. Mod  MDM  Includes review of EMR, discussing  care with other staff members involved in patient's medical care, obtaining relevant history and information from patient and/or patient's family, and personal review of imaging and lab work. Greater than 50% of the time was spent counseling and coordinating care related to the above assessment and plan.    Loistine Chance MD.  Palliative Care Provider Team Phone # [phone] (Nights starting at 7 PM/Weekends)

## 2021-10-27 NOTE — Progress Notes (Signed)
Progress Note   Patient: Cole Mayer:[id number] DOB: 05-Dec-1935 DOA: 10/20/2021     7 DOS: the patient was seen and examined on 10/27/2021   Brief hospital course: 86 y.o. male with past medical history significant for advanced dementia, essential hypertension, CAD, hypothyroidism, hyperlipidemia, anxiety/depression who presented to [facility] ED on 9/30 via ambulance from [facility] with lethargy, difficulty swallowing over the last 2 days.  Patient with advanced dementia unable to participate in HPI.  Staff reported that no oral intake over the last 2 days.  At baseline minimally verbal.  History also obtained from daughter who reports recently discharged from [facility] 1 week ago.  While at a rehab, reported not eating and his daughter noticed that he was wincing when he was swallowing as if you are in pain.  No reported fevers or nausea/vomiting.  When his symptoms did not improve she requested the facility sent him to the ED for further evaluation.   In the ED, temperature 96.0 F, HR 74, RR 13, BP 141/79, SPO2 98% on room air.  WBC 21.1, hemoglobin 16.5, platelets 316.  Sodium 150, potassium 3.8, chloride 118, CO2 21, glucose 116, BUN 50, creatinine 1.82.  AST 30, ALT 34, total bilirubin 0.8.  High sensitive troponin 74> 63.  Lactic acid 2.6.  INR 1.3.  TSH 35.047.  COVID-19 PCR positive.  Influenza A/B PCR negative.  Urinalysis unrevealing.  CT head without contrast with no acute intracranial normalities, but notable chronic microvascular disease and brain atrophy.  CT soft tissue neck with pharyngitis/supraglottitis, no abscess or airway obstruction.  Chest x-ray with no acute cardiopulmonary disease process.  Blood cultures x2 obtained.  EDP discussed with ENT, Dr. Redmond Baseman who recommended medical admission with IV antibiotics, IV steroids.  TRH consulted for admission for further evaluation and treatment. Given poor PO intake, attempted trial of NG, however was not successful. Per  Palliative Care, family decision was to transition to comfort measures  Assessment and Plan: Acute metabolic encephalopathy Patient presenting to ED with from SNF with lethargy, reduced oral intake in the setting of underlying advanced dementia.  Recently discharged from [facility].  Etiology likely multifactorial with active infection with pharyngitis/supraglottitis, COVID-19 viral infection, and possible underlying myxedema. --See below, pt now on comfort measures only. Appreciate assistance by Palliative Care   COVID-19 viral infection COVID-19 PCR positive.  Chest x-ray with no acute cardiopulmonary disease process.  Unable to tolerate oral intake and completed 3-day course of remdesivir --Continue airborne/contact isolation for 10 days from initial diagnosis on 9/30 -now focus on comfort   Pharyngitis/supraglottitis WBC count elevated 21.1 on admission.  CT soft tissue neck with pharyngitis/supraglottitis with no abscess or airway obstruction.  EDP discussed with ENT on-call, Dr. Redmond Baseman who recommended medical admission with IV antibiotics, IV steroids. --Was given Unasyn 3 g IV every 6 hours --Was given Hydrocortisone 50 mg IV every 8 hours for possible myxedema coma as below --Now on comfort measures per below, allowing comfort feeds as able   Oropharyngeal candidiasis --Was given Fluconazole 50 mg IV every 24 hours -Now comfort measures   Hypothyroidism Possible myxedema Patient presenting with confusion from his typical baseline, lethargy.  TSH elevated at 35.  Unlikely taking his medications.  Patient was notably hypothermic leading to concern for possible myxedema. - Bair hugger later discontinued as normothermic --Now on comfort measures per below   Acute renal failure on CKD stage IIIb Creatinine elevated 1.90 on admission.  Baseline of 1.24 on 10/10/2021.  Etiology likely secondary to severe dehydration in the setting of inability to tolerate oral intake.  Renal  ultrasounds with mild left hydronephrosis, 4 mm calculus inferior left kidney, fullness of the right renal pelvis without frank hydronephrosis, slightly decreased size right kidney in comparison to ultrasound in 2021, limited exam due to overlying bowel gas. -Most recent renal function noted to be improved --now comfort measures   Hypernatremia 2/2 severe dehydration Na 150 on admission, likely secondary to severe dehydration in the setting of poor oral intake complicated by dysphagia and pharyngitis/supraglottitis. -NPO recommended by SLP -- Attempted NG tube, however failed. Pt did not tolerate. Discussed with Palliative Care. Recs for transition to comfort measures with residential hospice   Hypokalemia Potassium 3.0 this morning, likely secondary to poor oral intake.  -now transitioned to comfort care   Essential hypertension On amlodipine 2.5 mg p.o. daily at home.  BP 125/76, well controlled. --Continue to hold antihypertensives, unable to tolerate oral intake at this time --had been on Hydralazine '10mg'$  IV q6h PRN SBP >165 or DBP >110   CAD --Holding home aspirin due to intolerance of oral intake   Hyperlipidemia On simvastatin 40 mg daily at home. -- Holding oral medications at this time   Dysphagia -- SLP following, had recommended n.p.o. -- Failed trial of NG placement. Palliative Care following, now recs to transition to comfort measures - Will allow comfort feeding as able   Advanced dementia --Delirium precautions --Get up during the day --Encourage a familiar face to remain present throughout the day --Keep blinds open and lights on during daylight hours --Minimize the use of opioids/benzodiazepines --Holding home benazepril and Namenda due to inability to tolerate oral intake at this time --Haldol PRN -Later transitioned to comfort measures  DNR  End of Life -Unable to tolerate PO and now not tolerating NG placement of artifical nutrition -Palliative Care  following, decision noted for transition to comfort measures. Residential hospice pending      Subjective: Pt sleeping, appears comfortable  Physical Exam: Vitals:   10/26/21 1212 10/26/21 2304 10/27/21 0556 10/27/21 1806  BP: 118/68 (!) 138/96 121/66 (!) 140/82  Pulse: 71 63 64 77  Resp: '18 18 16 16  '$ Temp: (!) 97.4 F (36.3 C) (!) 97.3 F (36.3 C) 98.2 F (36.8 C) 98.2 F (36.8 C)  TempSrc: Oral Oral Oral Oral  SpO2: 97% 96% 95% 97%  Weight:      Height:       General exam: asleep, in no acute distress Respiratory system: normal chest rise, clear, no audible wheezing Cardiovascular system: regular rhythm, appears perfused Data Reviewed:  There are no new results to review at this time.  Family Communication: Pt in room, family not at bedside  Disposition: Status is: Inpatient Remains inpatient appropriate because: Severity of illness  Planned Discharge Destination:  Residential hospice    Author: Marylu Lund, MD 10/27/2021 6:23 PM  For on call review [email or web address].

## 2021-10-28 ENCOUNTER — Other Ambulatory Visit: Payer: Self-pay

## 2021-10-28 ENCOUNTER — Encounter (HOSPITAL_COMMUNITY): Payer: Self-pay | Admitting: Internal Medicine

## 2021-10-28 DIAGNOSIS — N179 Acute kidney failure, unspecified: Secondary | ICD-10-CM | POA: Diagnosis not present

## 2021-10-28 DIAGNOSIS — Z7189 Other specified counseling: Secondary | ICD-10-CM | POA: Diagnosis not present

## 2021-10-28 NOTE — Progress Notes (Signed)
Progress Note   Patient: Cole Mayer KPT:[id number] DOB: Jun 04, 1935 DOA: 10/20/2021     8 DOS: the patient was seen and examined on 10/28/2021   Brief hospital course: 86 y.o. male with past medical history significant for advanced dementia, essential hypertension, CAD, hypothyroidism, hyperlipidemia, anxiety/depression who presented to [facility] ED on 9/30 via ambulance from [facility] with lethargy, difficulty swallowing over the last 2 days.  Patient with advanced dementia unable to participate in HPI.  Staff reported that no oral intake over the last 2 days.  At baseline minimally verbal.  History also obtained from daughter who reports recently discharged from [facility] 1 week ago.  While at a rehab, reported not eating and his daughter noticed that he was wincing when he was swallowing as if you are in pain.  No reported fevers or nausea/vomiting.  When his symptoms did not improve she requested the facility sent him to the ED for further evaluation.   In the ED, temperature 96.0 F, HR 74, RR 13, BP 141/79, SPO2 98% on room air.  WBC 21.1, hemoglobin 16.5, platelets 316.  Sodium 150, potassium 3.8, chloride 118, CO2 21, glucose 116, BUN 50, creatinine 1.82.  AST 30, ALT 34, total bilirubin 0.8.  High sensitive troponin 74> 63.  Lactic acid 2.6.  INR 1.3.  TSH 35.047.  COVID-19 PCR positive.  Influenza A/B PCR negative.  Urinalysis unrevealing.  CT head without contrast with no acute intracranial normalities, but notable chronic microvascular disease and brain atrophy.  CT soft tissue neck with pharyngitis/supraglottitis, no abscess or airway obstruction.  Chest x-ray with no acute cardiopulmonary disease process.  Blood cultures x2 obtained.  EDP discussed with ENT, Dr. Redmond Baseman who recommended medical admission with IV antibiotics, IV steroids.  TRH consulted for admission for further evaluation and treatment. Given poor PO intake, attempted trial of NG, however was not successful. Per  Palliative Care, family decision was to transition to comfort measures  Assessment and Plan: Acute metabolic encephalopathy Patient presenting to ED with from SNF with lethargy, reduced oral intake in the setting of underlying advanced dementia.  Recently discharged from [facility].  Etiology likely multifactorial with active infection with pharyngitis/supraglottitis, COVID-19 viral infection, and possible underlying myxedema. --See below, pt now on comfort measures only. Appreciate assistance by Palliative Care, pending residential hospice   COVID-19 viral infection COVID-19 PCR positive.  Chest x-ray with no acute cardiopulmonary disease process.  Unable to tolerate oral intake and completed 3-day course of remdesivir --Continue airborne/contact isolation for 10 days from initial diagnosis on 9/30 -now focus on comfort   Pharyngitis/supraglottitis WBC count elevated 21.1 on admission.  CT soft tissue neck with pharyngitis/supraglottitis with no abscess or airway obstruction.  EDP discussed with ENT on-call, Dr. Redmond Baseman who recommended medical admission with IV antibiotics, IV steroids. --Was given Unasyn 3 g IV every 6 hours --Was given Hydrocortisone 50 mg IV every 8 hours for possible myxedema coma as below --Now on comfort measures per below, allowing comfort feeds as pt desires   Oropharyngeal candidiasis --Was given Fluconazole 50 mg IV every 24 hours -Now comfort measures   Hypothyroidism Possible myxedema Patient presenting with confusion from his typical baseline, lethargy.  TSH elevated at 35.  Unlikely taking his medications.  Patient was notably hypothermic leading to concern for possible myxedema. - Bair hugger later discontinued as normothermic --Now on comfort measures per below   Acute renal failure on CKD stage IIIb Creatinine elevated 1.90 on admission.  Baseline of  1.24 on 10/10/2021.  Etiology likely secondary to severe dehydration in the setting of inability to  tolerate oral intake.  Renal ultrasounds with mild left hydronephrosis, 4 mm calculus inferior left kidney, fullness of the right renal pelvis without frank hydronephrosis, slightly decreased size right kidney in comparison to ultrasound in 2021, limited exam due to overlying bowel gas. -Most recent renal function noted to be improved --now comfort measures   Hypernatremia 2/2 severe dehydration Na 150 on admission, likely secondary to severe dehydration in the setting of poor oral intake complicated by dysphagia and pharyngitis/supraglottitis. -NPO recommended by SLP -- Attempted NG tube, however failed. Pt did not tolerate. Discussed with Palliative Care. Recs for transition to comfort measures with residential hospice   Hypokalemia Potassium 3.0 this morning, likely secondary to poor oral intake.  -now transitioned to comfort care   Essential hypertension On amlodipine 2.5 mg p.o. daily at home.  BP 125/76, well controlled. --Continue to hold antihypertensives, unable to tolerate oral intake at this time --had been on Hydralazine '10mg'$  IV q6h PRN SBP >165 or DBP >110   CAD --Holding home aspirin due to intolerance of oral intake   Hyperlipidemia On simvastatin 40 mg daily at home. -- Holding oral medications at this time   Dysphagia -- SLP following, had recommended n.p.o. -- Failed trial of NG placement. Palliative Care following, now recs to transition to comfort measures - Will allow comfort feeding as able   Advanced dementia --Delirium precautions --Get up during the day --Encourage a familiar face to remain present throughout the day --Keep blinds open and lights on during daylight hours --Minimize the use of opioids/benzodiazepines --Holding home benazepril and Namenda due to inability to tolerate oral intake at this time --Haldol PRN -Later transitioned to comfort measures  DNR  End of Life -Unable to tolerate PO and now not tolerating NG placement of artifical  nutrition -Palliative Care following, decision noted for transition to comfort measures. Residential hospice pending      Subjective: Resting comfortably  Physical Exam: Vitals:   10/27/21 0556 10/27/21 1806 10/27/21 2220 10/28/21 0339  BP: 121/66 (!) 140/82 132/80 109/65  Pulse: 64 77 75 82  Resp: '16 16 18 16  '$ Temp: 98.2 F (36.8 C) 98.2 F (36.8 C) 97.7 F (36.5 C) 98.8 F (37.1 C)  TempSrc: Oral Oral Oral Oral  SpO2: 95% 97% 98% 100%  Weight:      Height:       General exam: Awake, laying in bed, in nad Respiratory system: Normal respiratory effort, no audible wheezing Cardiovascular system: regular rate, s1, s2  Data Reviewed:  There are no new results to review at this time.  Family Communication: Pt in room, family not at bedside  Disposition: Status is: Inpatient Remains inpatient appropriate because: Severity of illness  Planned Discharge Destination:  Residential hospice    Author: Marylu Lund, MD 10/28/2021 2:16 PM  For on call review [email or web address].

## 2021-10-28 NOTE — TOC Progression Note (Signed)
Transition of Care [facility]) - Progression Note    Patient Details  Name: Cole Mayer MRN: [id number] Date of Birth: 1935-02-10  Transition of Care [facility]) CM/SW [place], [place] Phone Number: 10/28/2021, 10:19 AM  Clinical Narrative:    Currently no beds available for residential Hospice in [place].         Expected Discharge Plan and Services                                                 Social Determinants of Health (SDOH) Interventions Food Insecurity Interventions: Other (Comment) Housing Interventions: Other (Comment) Transportation Interventions: Other (Comment) Utilities Interventions: Other (Comment)  Readmission Risk Interventions     No data to display

## 2021-10-28 NOTE — Progress Notes (Signed)
Daughter in to visit w pt, attempted to give pt sm bites of choc ice cream but pt immediately spit it out. Also tried sm sips of water and pt again spit it out. Moisturizer applied.

## 2021-10-29 MED ORDER — LORAZEPAM 0.5 MG PO TABS: 0.5000 mg | ORAL_TABLET | Freq: Three times a day (TID) | ORAL | 0 refills | Status: AC

## 2021-10-29 MED ORDER — HALOPERIDOL LACTATE 2 MG/ML PO CONC: 2.0000 mg | Freq: Four times a day (QID) | ORAL | 0 refills | Status: AC | PRN

## 2021-10-29 NOTE — Discharge Summary (Signed)
Physician Discharge Summary   Patient: Cole Mayer MRN: [id number] DOB: August 03, 1935  Admit date:     10/20/2021  Discharge date: 10/29/21  Discharge Physician: Marylu Lund   PCP: Patient, No Pcp Per   Recommendations at discharge:    Follow up with hospice services Routine foley care. Foley in place for comfort  Discharge Diagnoses: Principal Problem:   Pharyngitis Active Problems:   Mixed hyperlipidemia   Essential hypertension, benign   CAD, NATIVE VESSEL   Dementia (HCC)   Elevated troponin   Odynophagia   Myxedema   Dehydration   AKI (acute kidney injury) ([place])   Thrush   Pressure injury of skin   Palliative care encounter   Goals of care, counseling/discussion   Counseling and coordination of care   Weakness   COVID-19   Acute kidney injury ([place])   Hypothyroidism  Resolved Problems:   * No resolved hospital problems. [facility]: 86 y.o. male with past medical history significant for advanced dementia, essential hypertension, CAD, hypothyroidism, hyperlipidemia, anxiety/depression who presented to [facility] ED on 9/30 via ambulance [facility] with lethargy, difficulty swallowing over the last 2 days.  Patient with advanced dementia unable to participate in HPI.  Staff reported that no oral intake over the last 2 days.  At baseline minimally verbal.  History also obtained from daughter who reports recently discharged from [facility] [place] 1 week ago.  While at a rehab, reported not eating and his daughter noticed that he was wincing when he was swallowing as if you are in pain.  No reported fevers or nausea/vomiting.  When his symptoms did not improve she requested the facility sent him to the ED for further evaluation.   In the ED, temperature 96.0 F, HR 74, RR 13, BP 141/79, SPO2 98% on room air.  WBC 21.1, hemoglobin 16.5, platelets 316.  Sodium 150, potassium 3.8, chloride 118, CO2 21, glucose 116, BUN 50, creatinine 1.82.  AST 30, ALT 34, total  bilirubin 0.8.  High sensitive troponin 74> 63.  Lactic acid 2.6.  INR 1.3.  TSH 35.047.  COVID-19 PCR positive.  Influenza A/B PCR negative.  Urinalysis unrevealing.  CT head without contrast with no acute intracranial normalities, but notable chronic microvascular disease and brain atrophy.  CT soft tissue neck with pharyngitis/supraglottitis, no abscess or airway obstruction.  Chest x-ray with no acute cardiopulmonary disease process.  Blood cultures x2 obtained.  EDP discussed with ENT, Dr. Redmond Baseman who recommended medical admission with IV antibiotics, IV steroids.  TRH consulted for admission for further evaluation and treatment. Given poor PO intake, attempted trial of NG, however was not successful. Per Palliative Care, family decision was to transition to comfort measures  Assessment and Plan: Acute metabolic encephalopathy Patient presenting to ED with from SNF with lethargy, reduced oral intake in the setting of underlying advanced dementia.  Recently discharged from [facility].  Etiology likely multifactorial with active infection with pharyngitis/supraglottitis, COVID-19 viral infection, and possible underlying myxedema. --See below, pt now on comfort measures only. Appreciate assistance by Palliative Care, pending residential hospice   COVID-19 viral infection COVID-19 PCR positive.  Chest x-ray with no acute cardiopulmonary disease process.  Unable to tolerate oral intake and completed 3-day course of remdesivir --Continue airborne/contact isolation for 10 days from initial diagnosis on 9/30 -now focus on comfort   Pharyngitis/supraglottitis WBC count elevated 21.1 on admission.  CT soft tissue neck with pharyngitis/supraglottitis with no abscess or airway obstruction.  EDP discussed with  ENT on-call, Dr. Redmond Baseman who recommended medical admission with IV antibiotics, IV steroids. --Was given Unasyn 3 g IV every 6 hours --Was given Hydrocortisone 50 mg IV every 8 hours for possible  myxedema coma as below --Now on comfort measures per below, allowing comfort feeds as pt desires   Oropharyngeal candidiasis --Was given Fluconazole 50 mg IV every 24 hours -Now comfort measures   Hypothyroidism Possible myxedema Patient presenting with confusion from his typical baseline, lethargy.  TSH elevated at 35.  Unlikely taking his medications.  Patient was notably hypothermic leading to concern for possible myxedema. - Bair hugger later discontinued as normothermic --Now on comfort measures per below   Acute renal failure on CKD stage IIIb Creatinine elevated 1.90 on admission.  Baseline of 1.24 on 10/10/2021.  Etiology likely secondary to severe dehydration in the setting of inability to tolerate oral intake.  Renal ultrasounds with mild left hydronephrosis, 4 mm calculus inferior left kidney, fullness of the right renal pelvis without frank hydronephrosis, slightly decreased size right kidney in comparison to ultrasound in 2021, limited exam due to overlying bowel gas. -Most recent renal function noted to be improved --now comfort measures   Hypernatremia 2/2 severe dehydration Na 150 on admission, likely secondary to severe dehydration in the setting of poor oral intake complicated by dysphagia and pharyngitis/supraglottitis. -NPO recommended by SLP -- Attempted NG tube, however failed. Pt did not tolerate. Discussed with Palliative Care. Recs for transition to comfort measures with residential hospice   Hypokalemia Potassium 3.0 this morning, likely secondary to poor oral intake.  -now transitioned to comfort care   Essential hypertension On amlodipine 2.5 mg p.o. daily at home.  BP 125/76, well controlled. --Continue to hold antihypertensives, unable to tolerate oral intake at this time --had been on Hydralazine '10mg'$  IV q6h PRN SBP >165 or DBP >110   CAD --Holding home aspirin due to intolerance of oral intake   Hyperlipidemia On simvastatin 40 mg daily at home. --  Holding oral medications at this time   Dysphagia -- SLP following, had recommended n.p.o. -- Failed trial of NG placement. Palliative Care following, now recs to transition to comfort measures - Allowing comfort feeding as patient desires   Advanced dementia --Delirium precautions --Get up during the day --Encourage a familiar face to remain present throughout the day --Keep blinds open and lights on during daylight hours --Minimize the use of opioids/benzodiazepines --Holding home benazepril and Namenda due to inability to tolerate oral intake at this time --Haldol PRN -Later transitioned to comfort measures   DNR   End of Life -Unable to tolerate PO and now not tolerating NG placement of artifical nutrition -Palliative Care following, decision noted for transition to comfort measures. Residential hospice planned       Consultants: Palliative Care Procedures performed:   Disposition:  Residential Hospice Diet recommendation:  Comfort feeding as patient desires  DISCHARGE MEDICATION: Allergies as of 10/29/2021   No Known Allergies      Medication List     STOP taking these medications    amLODipine 2.5 MG tablet Commonly known as: NORVASC   aspirin EC 81 MG tablet   donepezil 10 MG tablet Commonly known as: ARICEPT   levothyroxine 112 MCG tablet Commonly known as: SYNTHROID   melatonin 3 MG Tabs tablet   memantine 10 MG tablet Commonly known as: NAMENDA   nitroGLYCERIN 0.4 MG SL tablet Commonly known as: NITROSTAT   polyethylene glycol 17 g packet Commonly known as: MIRALAX / GLYCOLAX  QUEtiapine 25 MG tablet Commonly known as: SEROQUEL   sertraline 25 MG tablet Commonly known as: ZOLOFT   simvastatin 40 MG tablet Commonly known as: ZOCOR       TAKE these medications    haloperidol 2 MG/ML solution Commonly known as: HALDOL Take 1 mL (2 mg total) by mouth every 6 (six) hours as needed for agitation.   LORazepam 0.5 MG  tablet Commonly known as: Ativan Take 1 tablet (0.5 mg total) by mouth every 8 (eight) hours.        Follow-up Information     Satira Sark, MD Follow up.   Specialty: Cardiology Why: As needed Contact information: [place] [address] [phone]                Discharge Exam: Danley Danker Weights   10/21/21 1848  Weight: 77.5 kg   General exam: Awake, laying in bed, in nad Respiratory system: Normal respiratory effort, no wheezing Cardiovascular system: regular rate, s1, s2 Gastrointestinal system: Soft, nondistended, positive BS Central nervous system: CN2-12 grossly intact, strength intact Extremities: Perfused, no clubbing Skin: Normal skin turgor, no notable skin lesions seen Psychiatry: Mood normal // no visual hallucinations   Condition at discharge: fair  The results of significant diagnostics from this hospitalization (including imaging, microbiology, ancillary and laboratory) are listed below for reference.   Imaging Studies: CT HEAD WO CONTRAST (5MM)  Result Date: 10/21/2021 CLINICAL DATA:  Altered mental status, fall EXAM: CT HEAD WITHOUT CONTRAST TECHNIQUE: Contiguous axial images were obtained from the base of the skull through the vertex without intravenous contrast. RADIATION DOSE REDUCTION: This exam was performed according to the departmental dose-optimization program which includes automated exposure control, adjustment of the mA and/or kV according to patient size and/or use of iterative reconstruction technique. COMPARISON:  10/20/2021 FINDINGS: Brain: No evidence of acute infarction, hemorrhage, mass, mass effect, or midline shift. No hydrocephalus or extra-axial fluid collection. Periventricular white matter changes, likely the sequela of chronic small vessel ischemic disease. Advanced cerebral atrophy for age with commensurate size of the ventricles and sulci. Vascular: No hyperdense vessel. Skull: Normal. Negative for  fracture or focal lesion. Sinuses/Orbits: Minimal mucosal thickening in the ethmoid air cells. Status post bilateral lens replacements. Other: The mastoid air cells are well aerated. IMPRESSION: No acute intracranial process. Electronically Signed   By: Merilyn Baba M.D.   On: 10/21/2021 21:30   US RENAL  Result Date: 10/20/2021 CLINICAL DATA:  Acute kidney injury EXAM: RENAL / URINARY TRACT ULTRASOUND COMPLETE COMPARISON:  10/01/2021 CT abdomen pelvis, 12/14/2019 renal ultrasound FINDINGS: Evaluation is somewhat limited due to overlying bowel gas. Right Kidney: Renal measurements: 8.0 x 4.0 x 3.1 cm = volume: 52 mL. On the prior ultrasound, the right kidney head a volume of 162 mL. Cortical thinning. Echogenicity within normal limits. No mass or hydronephrosis visualized, although the pelvis appears somewhat full. Left Kidney: Renal measurements: 12.1 x 5.8 x 4.4 cm = volume: 161 mL. Cortical thinning. Echogenicity within normal limits. Mild hydronephrosis. A shadowing calculus is noted in the inferior left kidney, which measures up to 4 mm. Bladder: Foley present.  The bladder is empty. Other: None. IMPRESSION: 1. Evaluation is somewhat limited by overlying bowel gas. Within this limitation, the right kidney appears to have decreased in size compared to the 12/14/2019 renal ultrasound. 2. Mild left hydronephrosis. 3. 4 mm calculus in the inferior left kidney. 4. Fullness of the right renal pelvis without frank hydronephrosis. Electronically Signed  By: Merilyn Baba M.D.   On: 10/20/2021 23:18   CT Soft Tissue Neck W Contrast  Result Date: 10/20/2021 CLINICAL DATA:  Epiglottitis or tonsillitis suspected. Thrush. Sore throat EXAM: CT NECK WITH CONTRAST TECHNIQUE: Multidetector CT imaging of the neck was performed using the standard protocol following the bolus administration of intravenous contrast. RADIATION DOSE REDUCTION: This exam was performed according to the departmental dose-optimization program  which includes automated exposure control, adjustment of the mA and/or kV according to patient size and/or use of iterative reconstruction technique. CONTRAST:  57m OMNIPAQUE IOHEXOL 300 MG/ML  SOLN COMPARISON:  None Available. FINDINGS: Pharynx and larynx: Prominent mucosal enhancement in the pharynx and supraglottic larynx with some secretions or debris layering in the pharynx. Mild thickening of the epiglottis and uvula. No abscess or retropharyngeal effusion. Salivary glands: No inflammation, mass, or stone. Thyroid: Very atrophic and low-density Lymph nodes: None enlarged or abnormal density. Vascular: Scattered atheromatous calcification. Limited intracranial: Partial coverage of brain atrophy. Visualized orbits: No acute finding Mastoids and visualized paranasal sinuses: Clear Skeleton: Generalized cervical spine degeneration. No acute or aggressive finding Upper chest: Clear apical lungs IMPRESSION: Pharyngitis and supraglottitis.  No abscess or airway obstruction. Electronically Signed   By: JJorje GuildM.D.   On: 10/20/2021 11:56   DG Chest Portable 1 View  Result Date: 10/20/2021 CLINICAL DATA:  Lethargy. EXAM: PORTABLE CHEST 1 VIEW COMPARISON:  Chest radiograph 10/10/2021 and earlier FINDINGS: The heart size and mediastinal contours are within normal limits. Both lungs are clear. No pleural effusion or pneumothorax. Multilevel degenerative changes in the thoracic spine. Mild-to-moderate osteoarthritis of the bilateral acromioclavicular and glenohumeral joints, right-greater-than-left. IMPRESSION: No acute cardiopulmonary abnormality. Electronically Signed   By: LIleana RoupM.D.   On: 10/20/2021 11:35   CT HEAD WO CONTRAST  Result Date: 10/20/2021 CLINICAL DATA:  Mental status change of unknown etiology. EXAM: CT HEAD WITHOUT CONTRAST TECHNIQUE: Contiguous axial images were obtained from the base of the skull through the vertex without intravenous contrast. RADIATION DOSE REDUCTION: This  exam was performed according to the departmental dose-optimization program which includes automated exposure control, adjustment of the mA and/or kV according to patient size and/or use of iterative reconstruction technique. COMPARISON:  10/10/2021 FINDINGS: Brain: No evidence of acute infarction, hemorrhage, hydrocephalus, extra-axial collection or mass lesion/mass effect. There is mild diffuse low-attenuation within the subcortical and periventricular white matter compatible with chronic microvascular disease. Prominence of sulci and ventricles compatible with brain atrophy. Vascular: No hyperdense vessel or unexpected calcification. Skull: Normal. Negative for fracture or focal lesion. Sinuses/Orbits: No acute finding. Other: None. IMPRESSION: 1. No acute intracranial abnormalities. 2. Chronic microvascular disease and brain atrophy. Electronically Signed   By: TKerby MoorsM.D.   On: 10/20/2021 10:55   DG Chest 1 View  Result Date: 10/10/2021 CLINICAL DATA:  Provided history: Unwitnessed fall. History of dementia. EXAM: CHEST  1 VIEW COMPARISON:  Prior chest radiographs 09/02/2021 and earlier. FINDINGS: Small portions of both lateral costophrenic angles are excluded from the field of view. Heart size within normal limits. Aortic atherosclerosis. Mild prominence of the interstitial lung markings throughout the right lung and within the left lung base, new from the prior examination of 07/17/2021. No evidence of pleural effusion or pneumothorax. No acute bony abnormality identified. Degenerative changes of the spine. IMPRESSION: Small portions of both lateral costophrenic angles are excluded from the field of view. Mild prominence of the interstitial lung markings throughout the right lung and within the left lung base, new  from the prior examination of 07/17/2021. This may reflect asymmetric interstitial edema or atypical/viral pneumonia. Aortic Atherosclerosis (ICD10-I70.0). Electronically Signed   By:  Kellie Simmering D.O.   On: 10/10/2021 16:31   DG Pelvis 1-2 Views  Result Date: 10/10/2021 CLINICAL DATA:  Fall EXAM: PELVIS - 1-2 VIEW COMPARISON:  None Available. FINDINGS: There is no evidence of pelvic fracture or diastasis. No pelvic bone lesions are seen. IMPRESSION: Negative. Electronically Signed   By: Franchot Gallo M.D.   On: 10/10/2021 16:21   CT HEAD WO CONTRAST (5MM)  Result Date: 10/10/2021 CLINICAL DATA:  Fall.  Head trauma EXAM: CT HEAD WITHOUT CONTRAST CT CERVICAL SPINE WITHOUT CONTRAST TECHNIQUE: Multidetector CT imaging of the head and cervical spine was performed following the standard protocol without intravenous contrast. Multiplanar CT image reconstructions of the cervical spine were also generated. RADIATION DOSE REDUCTION: This exam was performed according to the departmental dose-optimization program which includes automated exposure control, adjustment of the mA and/or kV according to patient size and/or use of iterative reconstruction technique. COMPARISON:  CT cervical spine and head 07/17/2021 FINDINGS: CT HEAD FINDINGS Brain: Moderate atrophy. Ventricular enlargement, stable mild periventricular white matter hypodensity. Negative for acute infarct, hemorrhage, mass. Vascular: Negative for hyperdense vessel Skull: Negative Sinuses/Orbits: Mild mucosal edema paranasal sinuses. Bilateral cataract extraction Other: None CT CERVICAL SPINE FINDINGS Alignment: Normal Skull base and vertebrae: Negative for fracture. No acute skeletal abnormality Soft tissues and spinal canal: Negative for mass or soft tissue swelling in the neck. Disc levels: Cervical spondylosis. Multilevel disc degeneration and spurring. Mild spinal stenosis C3-4 due to spurring. Upper chest: Lung apices clear bilaterally Other: None IMPRESSION: 1. Moderate atrophy and mild chronic white matter changes. No acute intracranial abnormality 2. Cervical spondylosis.  Negative for fracture Electronically Signed   By: Franchot Gallo M.D.   On: 10/10/2021 14:09   CT Cervical Spine Wo Contrast  Result Date: 10/10/2021 CLINICAL DATA:  Fall.  Head trauma EXAM: CT HEAD WITHOUT CONTRAST CT CERVICAL SPINE WITHOUT CONTRAST TECHNIQUE: Multidetector CT imaging of the head and cervical spine was performed following the standard protocol without intravenous contrast. Multiplanar CT image reconstructions of the cervical spine were also generated. RADIATION DOSE REDUCTION: This exam was performed according to the departmental dose-optimization program which includes automated exposure control, adjustment of the mA and/or kV according to patient size and/or use of iterative reconstruction technique. COMPARISON:  CT cervical spine and head 07/17/2021 FINDINGS: CT HEAD FINDINGS Brain: Moderate atrophy. Ventricular enlargement, stable mild periventricular white matter hypodensity. Negative for acute infarct, hemorrhage, mass. Vascular: Negative for hyperdense vessel Skull: Negative Sinuses/Orbits: Mild mucosal edema paranasal sinuses. Bilateral cataract extraction Other: None CT CERVICAL SPINE FINDINGS Alignment: Normal Skull base and vertebrae: Negative for fracture. No acute skeletal abnormality Soft tissues and spinal canal: Negative for mass or soft tissue swelling in the neck. Disc levels: Cervical spondylosis. Multilevel disc degeneration and spurring. Mild spinal stenosis C3-4 due to spurring. Upper chest: Lung apices clear bilaterally Other: None IMPRESSION: 1. Moderate atrophy and mild chronic white matter changes. No acute intracranial abnormality 2. Cervical spondylosis.  Negative for fracture Electronically Signed   By: Franchot Gallo M.D.   On: 10/10/2021 14:09    Microbiology: Results for orders placed or performed during the hospital encounter of 10/20/21  Resp Panel by RT-PCR (Flu A&B, Covid) Anterior Nasal Swab     Status: Abnormal   Collection Time: 10/20/21 10:38 AM   Specimen: Anterior Nasal Swab  Result Value Ref Range  Status   SARS Coronavirus 2 by RT PCR POSITIVE (A) NEGATIVE Final    Comment: (NOTE) SARS-CoV-2 target nucleic acids are DETECTED.  The SARS-CoV-2 RNA is generally detectable in upper respiratory specimens during the acute phase of infection. Positive results are indicative of the presence of the identified virus, but do not rule out bacterial infection or co-infection with other pathogens not detected by the test. Clinical correlation with patient history and other diagnostic information is necessary to determine patient infection status. The expected result is Negative.  Fact Sheet for Patients: EntrepreneurPulse.com.au  Fact Sheet for Healthcare Providers: IncredibleEmployment.be  This test is not yet approved or cleared by the [place] FDA and  has been authorized for detection and/or diagnosis of SARS-CoV-2 by FDA under an Emergency Use Authorization (EUA).  This EUA will remain in effect (meaning this test can be used) for the duration of  the COVID-19 declaration under Section 564(b)(1) of the A ct, 21 U.S.C. section 360bbb-3(b)(1), unless the authorization is terminated or revoked sooner.     Influenza A by PCR NEGATIVE NEGATIVE Final   Influenza B by PCR NEGATIVE NEGATIVE Final    Comment: (NOTE) The Xpert Xpress SARS-CoV-2/FLU/RSV plus assay is intended as an aid in the diagnosis of influenza from Nasopharyngeal swab specimens and should not be used as a sole basis for treatment. Nasal washings and aspirates are unacceptable for Xpert Xpress SARS-CoV-2/FLU/RSV testing.  Fact Sheet for Patients: EntrepreneurPulse.com.au  Fact Sheet for Healthcare Providers: IncredibleEmployment.be  This test is not yet approved or cleared by the [place] FDA and has been authorized for detection and/or diagnosis of SARS-CoV-2 by FDA under an Emergency Use Authorization (EUA). This EUA will remain in  effect (meaning this test can be used) for the duration of the COVID-19 declaration under Section 564(b)(1) of the Act, 21 U.S.C. section 360bbb-3(b)(1), unless the authorization is terminated or revoked.  Performed at [facility], [place] [address], [place], [place] [address]   Blood Culture (routine x 2)     Status: None   Collection Time: 10/20/21 12:55 PM   Specimen: BLOOD  Result Value Ref Range Status   Specimen Description   Final    BLOOD BLOOD LEFT HAND Performed at [facility] [address], [place], [place] [address]    Special Requests   Final    BOTTLES DRAWN AEROBIC ONLY Blood Culture results may not be optimal due to an inadequate volume of blood received in culture bottles Performed at [facility] [address], [place], [place] [address]    Culture   Final    NO GROWTH 5 DAYS Performed at [facility], [place] [address] [place], [place] [address]    Report Status 10/25/2021 FINAL  Final    Labs: CBC: Recent Labs  Lab 10/23/21 0424 10/24/21 0459  WBC 23.0* 19.7*  NEUTROABS 20.8* 17.6*  HGB 13.5 12.8*  HCT 40.9 39.6  MCV 90.1 91.0  PLT 251 767   Basic Metabolic Panel: Recent Labs  Lab 10/23/21 0424 10/24/21 0459  NA 148* 143  K 3.0* 3.0*  CL 119* 114*  CO2 22 21*  GLUCOSE 135* 180*  BUN 33* 23  CREATININE 1.33* 1.14  CALCIUM 7.3* 6.8*  MG 1.6* 2.2   Liver Function Tests: Recent Labs  Lab 10/23/21 0424 10/24/21 0459  AST 24 27  ALT 24 24  ALKPHOS 29* 27*  BILITOT 0.7 0.7  PROT 4.7* 4.4*  ALBUMIN 2.2*  2.0*   CBG: No results for input(s): "GLUCAP" in the last 168 hours.  Discharge time spent: less than 30 minutes.  Signed: Marylu Lund, MD [facility] 10/29/2021

## 2021-10-29 NOTE — Progress Notes (Signed)
Patient was picked up by [facility] to go to [facility] was given the packet for the patient. Patient was stable for discharge.

## 2021-10-29 NOTE — TOC Transition Note (Signed)
Transition of Care [facility]) - CM/SW Discharge Note   Patient Details  Name: Cole Mayer MRN: [id number] Date of Birth: 05/04/1935  Transition of Care [facility]) CM/SW Contact:  Ross Ludwig, LCSW Phone Number: 10/29/2021, 1:25 PM   Clinical Narrative:     CSW was informed by [facility] available for today.  CSW updated attending physician and bedside nurse that patient can discharge today if medically ready.  Patient to be d/c'ed today to [facility] facility.  Patient and family agreeable to plans will transport via ems RN to call report to [phone].  Patient's daughter was made aware that patient is discharging today.     Final next level of care: [place] Barriers to Discharge: Barriers Resolved   Patient Goals and CMS Choice Patient states their goals for this hospitalization and ongoing recovery are:: To go to [facility] facility for end of life care. CMS Medicare.gov Compare Post Acute Care list provided to:: Patient Represenative (must comment) Choice offered to / list presented to : Adult Children  Discharge Placement              Patient chooses bed at: Other - please specify in the comment section below: ([place] ([facility] facility)) Patient to be transferred to facility by: [facility] Name of family member notified: Daughter [name] Patient and family notified of of transfer: 10/29/21  Discharge Plan and Services                                     Social Determinants of Health (SDOH) Interventions Food Insecurity Interventions: Other (Comment) Housing Interventions: Other (Comment) Transportation Interventions: Other (Comment) Utilities Interventions: Other (Comment)   Readmission Risk Interventions     No data to display

## 2021-10-29 NOTE — TOC Progression Note (Signed)
Transition of Care [facility]) - Progression Note    Patient Details  Name: Cole Mayer MRN: [id number] Date of Birth: 11-17-1935  Transition of Care [facility]) CM/SW Contact  Ross Ludwig, [place] Phone Number: 10/29/2021, 11:58 AM  Clinical Narrative:     CSW received phone call that patient has a bed available at [place], [place] notified attending physician and bedside nurse.       Expected Discharge Plan and Services                                                 Social Determinants of Health (SDOH) Interventions Food Insecurity Interventions: Other (Comment) Housing Interventions: Other (Comment) Transportation Interventions: Other (Comment) Utilities Interventions: Other (Comment)  Readmission Risk Interventions     No data to display

## 2021-10-29 NOTE — Progress Notes (Signed)
  Daily Progress Note   Patient Name: Cole Mayer       Date: 10/29/2021 DOB: Nov 13, 1935  Age: 86 y.o. MRN#: [id number] Attending Physician: Donne Hazel, MD Primary Care Physician: Patient, No Pcp Per Admit Date: 10/20/2021 Length of Stay: 9 days  Reason for Consultation/Follow-up: Establishing goals of care  Subjective:   CC: resting in bed, no distress.   Subjective: Reviewed EMR prior to presenting to bedside.  Also discussed with other members of the team. Call placed and discussed with [facility] [place] on 10-25-21 as well, awaiting approval and transfer to hospice pending bed availability.      Oral intake is nil, recommend continuation of symptom focused mode of care.     Objective:   Vital Signs:  BP 139/78 (BP Location: Left Arm)   Pulse 71   Temp 98.1 F (36.7 C) (Oral)   Resp 15   Ht 6' 0.01" (1.829 m)   Wt 77.5 kg   SpO2 94%   BMI 23.17 kg/m    Assessment & Plan:   Assessment: Patient is an 86yo M with a PMHx of advanced dementia, HTN, CAD, hypothyroidism, HTN, CAD, HLD, and anxiety/depression who was admitted on 10/20/21 from [facility] SNF from management of lethargy and difficulty swallowing over the 2 days prior to arrival. Patient's family and EMR review also stated patient had recently been discharged from [facility] for rehab. 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 Palliative care consulted to assist with complex medical decision making.   Recommendations/Plan: # Complex medical decision making/goals of care:  - Unable to obtain from patient secondary to medical status.     -Patient's daughter, [name], is patient's legal guardian as per her and wife's report.  This  document not on file for review.  Patient does have a MOST form in EMR.                -Spoke with daughter on 10-24-21:transition to comfort focus and possible inpatient hospice transfer.  Awaiting hospice recommendations and bed availability.   Nil Po intake.                 -  Code Status: DNR     # Discharge Planning:  inpatient hospice placement to focus on comfort due to patient's  agitation and symptom burden.   Discussed with: IDT  Thank you for allowing [place] to participate in the care of [name].  Palliative care team will continue to follow along in patient's care. low  MDM  Includes review of EMR, discussing care with other staff members involved in patient's medical care, obtaining relevant history and information from patient and/or patient's family, and personal review of imaging and lab work. Greater than 50% of the time was spent counseling and coordinating care related to the above assessment and plan.    Loistine Chance MD.  Palliative Care Provider Team Phone # [phone] (Nights starting at 7 PM/Weekends)

## 2021-11-21 DEATH — deceased
